# Patient Record
Sex: Male | Born: 1950 | Race: Black or African American | Hispanic: No | Marital: Married | Smoking: Former smoker
Health system: Southern US, Community
[De-identification: ages and names within clinical notes are randomized; demographics above are authoritative.]

## PROBLEM LIST (undated history)

## (undated) DIAGNOSIS — E785 Hyperlipidemia, unspecified: Secondary | ICD-10-CM

## (undated) DIAGNOSIS — C801 Malignant (primary) neoplasm, unspecified: Secondary | ICD-10-CM

## (undated) DIAGNOSIS — M069 Rheumatoid arthritis, unspecified: Secondary | ICD-10-CM

## (undated) DIAGNOSIS — I10 Essential (primary) hypertension: Secondary | ICD-10-CM

## (undated) HISTORY — PX: COLONOSCOPY WITH PROPOFOL: SHX5780

## (undated) HISTORY — PX: MANDIBLE SURGERY: SHX707

---

## 2007-09-17 ENCOUNTER — Ambulatory Visit: Payer: Self-pay | Admitting: General Surgery

## 2008-12-21 ENCOUNTER — Other Ambulatory Visit: Payer: Self-pay

## 2008-12-22 ENCOUNTER — Other Ambulatory Visit: Payer: Self-pay | Admitting: Endocrinology

## 2009-01-17 ENCOUNTER — Ambulatory Visit: Payer: Self-pay | Admitting: Unknown Physician Specialty

## 2009-10-30 ENCOUNTER — Ambulatory Visit: Payer: Self-pay | Admitting: Neurology

## 2014-09-29 ENCOUNTER — Ambulatory Visit: Payer: Self-pay

## 2019-08-25 ENCOUNTER — Ambulatory Visit: Payer: Medicare Other | Attending: Internal Medicine

## 2019-08-25 DIAGNOSIS — Z23 Encounter for immunization: Secondary | ICD-10-CM | POA: Insufficient documentation

## 2019-08-25 NOTE — Progress Notes (Signed)
   Covid-19 Vaccination Clinic  Name:  Ralph Doyle    MRN: [id number] DOB: 1951/05/29  08/25/2019  [name] was observed post Covid-19 immunization for 15 minutes without incidence. He was provided with Vaccine Information Sheet and instruction to access the V-Safe system.   [name] was instructed to call 911 with any severe reactions post vaccine: Marland Kitchen Difficulty breathing  . Swelling of your face and throat  . A fast heartbeat  . A bad rash all over your body  . Dizziness and weakness    Immunizations Administered    Name Date Dose VIS Date Route   Pfizer COVID-19 Vaccine 08/25/2019 10:34 AM 0.3 mL 06/10/2019 Intramuscular   Manufacturer: [place]   Lot: [id number]   [place]: [id number]

## 2019-09-20 ENCOUNTER — Ambulatory Visit: Payer: Medicare Other | Attending: Internal Medicine

## 2019-09-20 DIAGNOSIS — Z23 Encounter for immunization: Secondary | ICD-10-CM

## 2019-09-20 NOTE — Progress Notes (Signed)
   Covid-19 Vaccination Clinic  Name:  Ralph Doyle    MRN: [id number] DOB: [birth year]  09/20/2019  [name] was observed post Covid-19 immunization for 15 minutes without incident. He was provided with Vaccine Information Sheet and instruction to access the V-Safe system.   [name] was instructed to call 911 with any severe reactions post vaccine: Marland Kitchen Difficulty breathing  . Swelling of face and throat  . A fast heartbeat  . A bad rash all over body  . Dizziness and weakness   Immunizations Administered    Name Date Dose VIS Date Route   Pfizer COVID-19 Vaccine 09/20/2019 10:47 AM 0.3 mL 06/10/2019 Intramuscular   Manufacturer: [place]   Lot: [id number]   [place]: [id number]

## 2020-04-18 ENCOUNTER — Inpatient Hospital Stay: Admission: RE | Admit: 2020-04-18 | Payer: Medicare Other | Source: Ambulatory Visit

## 2020-04-20 ENCOUNTER — Other Ambulatory Visit: Payer: Medicare Other

## 2020-05-08 ENCOUNTER — Encounter
Admission: RE | Admit: 2020-05-08 | Discharge: 2020-05-08 | Disposition: A | Discharge status: Home / Self Care | Payer: Medicare Other | Source: Ambulatory Visit | Attending: Otolaryngology | Admitting: Otolaryngology

## 2020-05-08 ENCOUNTER — Other Ambulatory Visit: Payer: Self-pay

## 2020-05-08 DIAGNOSIS — Z01818 Encounter for other preprocedural examination: Secondary | ICD-10-CM | POA: Insufficient documentation

## 2020-05-08 HISTORY — DX: Malignant (primary) neoplasm, unspecified: C80.1

## 2020-05-08 NOTE — Patient Instructions (Signed)
Your procedure is scheduled on: Wednesday 05/16/20.  Report to THE FIRST FLOOR REGISTRATION DESK IN THE MEDICAL MALL ON THE MORNING OF SURGERY FIRST, THEN YOU WILL CHECK IN AT THE SURGERY INFORMATION DESK LOCATED OUTSIDE THE SAME DAY SURGERY DEPARTMENT LOCATED ON 2ND FLOOR MEDICAL MALL ENTRANCE.  To find out your arrival time please call [phone] between 1PM - 3PM on Tuesday 05/15/20.   Remember: Instructions that are not followed completely may result in serious medical risk, up to and including death, or upon the discretion of your surgeon and anesthesiologist your surgery may need to be rescheduled.     __X__ 1. Do not eat food after midnight the night before your procedure.                 No gum chewing or hard candies. You may drink clear liquids up to 2 hours                 before you are scheduled to arrive for your surgery- DO NOT drink clear                 liquids within 2 hours of the start of your surgery.                 Clear Liquids include:  water, apple juice without pulp, clear carbohydrate                 drink such as Clearfast or Gatorade, Black Coffee or Tea (Do not add                 milk or creamer to coffee or tea).  __X__2.  On the morning of surgery brush your teeth with toothpaste and water, you may rinse your mouth with mouthwash if you wish.  Do not swallow any toothpaste or mouthwash.    __X__ 3.  No Alcohol for 24 hours before or after surgery.  __X__ 4.  Do Not Smoke or use e-cigarettes For 24 Hours Prior to Your Surgery.                 Do not use any chewable tobacco products for at least 6 hours prior to                 surgery.  __X__5.  Notify your doctor if there is any change in your medical condition      (cold, fever, infections).      Do NOT wear jewelry, make-up, hairpins, clips or nail polish. Do NOT wear lotions, powders, or perfumes.  Do NOT shave 48 hours prior to surgery. Men may shave face and neck. Do NOT bring valuables to  the hospital.     [facility] is not responsible for any belongings or valuables.   Contacts, dentures/partials or body piercings may not be worn into surgery. Bring a case for your contacts, glasses or hearing aids, a denture cup will be supplied. Leave your suitcase in the car. After surgery it may be brought to your room.   For patients admitted to the hospital, discharge time is determined by your treatment team.    Patients discharged the day of surgery will not be allowed to drive home.     __X__ Take these medicines the morning of surgery with A SIP OF WATER:     1. amLODipine (NORVASC)  2. atorvastatin (LIPITOR)     __X__ Use CHG Soap as directed  __X__ Stop Anti-inflammatories 7 days before  surgery such as Advil, Ibuprofen, Motrin, BC or Goodies Powder, Naprosyn, Naproxen, Aleve, Aspirin, Meloxicam. May take Tylenol if needed for pain or discomfort.   __X__Do not start taking any new herbal supplements or vitamins prior to your procedure.     Wear comfortable clothing (specific to your surgery type) to the hospital.  Plan for stool softeners for home use; pain medications have a tendency to cause constipation. You can also help prevent constipation by eating foods high in fiber such as fruits and vegetables and drinking plenty of fluids as your diet allows.  After surgery, you can prevent lung complications by doing breathing exercises.Take deep breaths and cough every 1-2 hours. Your doctor may order a device called an Incentive Spirometer to help you take deep breaths.  Please call the [place] Department at [phone] if you have any questions about these instructions.

## 2020-05-14 ENCOUNTER — Other Ambulatory Visit
Admission: RE | Admit: 2020-05-14 | Discharge: 2020-05-14 | Disposition: A | Discharge status: Home / Self Care | Payer: Medicare Other | Source: Ambulatory Visit | Attending: Otolaryngology | Admitting: Otolaryngology

## 2020-05-14 ENCOUNTER — Other Ambulatory Visit: Payer: Self-pay

## 2020-05-14 DIAGNOSIS — Z01812 Encounter for preprocedural laboratory examination: Secondary | ICD-10-CM | POA: Insufficient documentation

## 2020-05-14 DIAGNOSIS — Z20822 Contact with and (suspected) exposure to covid-19: Secondary | ICD-10-CM | POA: Insufficient documentation

## 2020-05-14 LAB — BASIC METABOLIC PANEL
Anion gap: 9 (ref 5–15)
BUN: 17 mg/dL (ref 8–23)
CO2: 30 mmol/L (ref 22–32)
Calcium: 10 mg/dL (ref 8.9–10.3)
Chloride: 101 mmol/L (ref 98–111)
Creatinine, Ser: 1.3 mg/dL — ABNORMAL HIGH (ref 0.61–1.24)
GFR, Estimated: >60 mL/min (ref >60)
Glucose, Bld: 97 mg/dL (ref 70–99)
Potassium: 4.8 mmol/L (ref 3.5–5.1)
Sodium: 140 mmol/L (ref 135–145)

## 2020-05-14 LAB — CBC
HCT: 46.8 % (ref 39.0–52.0)
Hemoglobin: 15.5 g/dL (ref 13.0–17.0)
MCH: 30.8 pg (ref 26.0–34.0)
MCHC: 33.1 g/dL (ref 30.0–36.0)
MCV: 92.9 fL (ref 80.0–100.0)
Platelets: 297 10*3/uL (ref 150–400)
RBC: 5.04 MIL/uL (ref 4.22–5.81)
RDW: 12.6 % (ref 11.5–15.5)
WBC: 9.7 10*3/uL (ref 4.0–10.5)
nRBC: 0 % (ref 0.0–0.2)

## 2020-05-15 LAB — SARS CORONAVIRUS 2 (TAT 6-24 HRS): SARS Coronavirus 2: NEGATIVE

## 2020-05-15 MED ORDER — LACTATED RINGERS IV SOLN: INTRAVENOUS | Status: DC

## 2020-05-15 MED ORDER — CHLORHEXIDINE GLUCONATE 0.12 % MT SOLN
15.0000 mL | Freq: Once | OROMUCOSAL | Status: AC
  Administered 2020-05-16: 15 mL via OROMUCOSAL

## 2020-05-15 MED ORDER — ORAL CARE MOUTH RINSE: 15.0000 mL | Freq: Once | OROMUCOSAL | Status: AC

## 2020-05-15 MED ORDER — FAMOTIDINE 20 MG PO TABS
20.0000 mg | ORAL_TABLET | Freq: Once | ORAL | Status: DC
  Filled 2020-05-15: qty 1

## 2020-05-16 ENCOUNTER — Ambulatory Visit: Payer: Medicare Other | Admitting: Urgent Care

## 2020-05-16 ENCOUNTER — Encounter: Payer: Self-pay | Admitting: Otolaryngology

## 2020-05-16 ENCOUNTER — Encounter
Admission: RE | Disposition: A | Discharge status: Home / Self Care | Payer: Self-pay | Source: Ambulatory Visit | Attending: Otolaryngology

## 2020-05-16 ENCOUNTER — Other Ambulatory Visit: Payer: Self-pay

## 2020-05-16 ENCOUNTER — Observation Stay
Admission: RE | Admit: 2020-05-16 | Discharge: 2020-05-17 | Disposition: A | Discharge status: Home / Self Care | Payer: Medicare Other | Source: Ambulatory Visit | Attending: Otolaryngology | Admitting: Otolaryngology

## 2020-05-16 DIAGNOSIS — C73 Malignant neoplasm of thyroid gland: Principal | ICD-10-CM | POA: Insufficient documentation

## 2020-05-16 HISTORY — DX: Hyperlipidemia, unspecified: E78.5

## 2020-05-16 HISTORY — DX: Essential (primary) hypertension: I10

## 2020-05-16 HISTORY — PX: THYROIDECTOMY: SHX17

## 2020-05-16 HISTORY — DX: Rheumatoid arthritis, unspecified: M06.9

## 2020-05-16 LAB — URINE DRUG SCREEN, QUALITATIVE (ARMC ONLY)
Amphetamines, Ur Screen: NOT DETECTED
Barbiturates, Ur Screen: NOT DETECTED
Benzodiazepine, Ur Scrn: NOT DETECTED
Cannabinoid 50 Ng, Ur ~~LOC~~: POSITIVE — AB
Cocaine Metabolite,Ur ~~LOC~~: NOT DETECTED
MDMA (Ecstasy)Ur Screen: NOT DETECTED
Methadone Scn, Ur: NOT DETECTED
Opiate, Ur Screen: NOT DETECTED
Phencyclidine (PCP) Ur S: NOT DETECTED
Tricyclic, Ur Screen: NOT DETECTED

## 2020-05-16 LAB — CALCIUM: Calcium: 9.3 mg/dL (ref 8.9–10.3)

## 2020-05-16 SURGERY — THYROIDECTOMY
Anesthesia: General | Site: Throat | Laterality: Bilateral

## 2020-05-16 MED ORDER — SODIUM CHLORIDE (PF) 0.9 % IJ SOLN
INTRAMUSCULAR | Status: AC
  Filled 2020-05-16: qty 20

## 2020-05-16 MED ORDER — ATORVASTATIN CALCIUM 10 MG PO TABS
10.0000 mg | ORAL_TABLET | Freq: Every day | ORAL | Status: DC
  Filled 2020-05-16: qty 1

## 2020-05-16 MED ORDER — PHENYLEPHRINE HCL (PRESSORS) 10 MG/ML IV SOLN
INTRAVENOUS | Status: AC
  Filled 2020-05-16: qty 1

## 2020-05-16 MED ORDER — FENTANYL CITRATE (PF) 100 MCG/2ML IJ SOLN
INTRAMUSCULAR | Status: AC
  Filled 2020-05-16: qty 2

## 2020-05-16 MED ORDER — LIDOCAINE HCL (PF) 2 % IJ SOLN
INTRAMUSCULAR | Status: AC
  Filled 2020-05-16: qty 5

## 2020-05-16 MED ORDER — AMLODIPINE BESYLATE 5 MG PO TABS
5.0000 mg | ORAL_TABLET | Freq: Every day | ORAL | Status: DC
  Administered 2020-05-17: 5 mg via ORAL
  Filled 2020-05-16 (×2): qty 1

## 2020-05-16 MED ORDER — ONDANSETRON HCL 4 MG/2ML IJ SOLN
4.0000 mg | INTRAMUSCULAR | Status: DC | PRN
  Administered 2020-05-16: 4 mg via INTRAVENOUS
  Filled 2020-05-16: qty 2

## 2020-05-16 MED ORDER — ACETAMINOPHEN 650 MG RE SUPP: 650.0000 mg | RECTAL | Status: DC | PRN

## 2020-05-16 MED ORDER — ONDANSETRON HCL 4 MG PO TABS: 4.0000 mg | ORAL_TABLET | ORAL | Status: DC | PRN

## 2020-05-16 MED ORDER — BACITRACIN ZINC 500 UNIT/GM EX OINT
TOPICAL_OINTMENT | CUTANEOUS | Status: AC
  Filled 2020-05-16: qty 28.35

## 2020-05-16 MED ORDER — PROPOFOL 10 MG/ML IV BOLUS
INTRAVENOUS | Status: DC | PRN
  Administered 2020-05-16: 50 mg via INTRAVENOUS
  Administered 2020-05-16: 150 mg via INTRAVENOUS

## 2020-05-16 MED ORDER — KCL IN DEXTROSE-NACL 20-5-0.45 MEQ/L-%-% IV SOLN: INTRAVENOUS | Status: DC

## 2020-05-16 MED ORDER — ONDANSETRON HCL 4 MG/2ML IJ SOLN
INTRAMUSCULAR | Status: DC | PRN
  Administered 2020-05-16: 4 mg via INTRAVENOUS

## 2020-05-16 MED ORDER — LIDOCAINE-EPINEPHRINE (PF) 1 %-1:200000 IJ SOLN
INTRAMUSCULAR | Status: DC | PRN
  Administered 2020-05-16: 3 mL

## 2020-05-16 MED ORDER — OXYCODONE HCL 5 MG/5ML PO SOLN
ORAL | Status: AC
  Filled 2020-05-16: qty 5

## 2020-05-16 MED ORDER — BACITRACIN 500 UNIT/GM EX OINT
TOPICAL_OINTMENT | CUTANEOUS | Status: DC | PRN
  Administered 2020-05-16: 1 via TOPICAL

## 2020-05-16 MED ORDER — LIDOCAINE-EPINEPHRINE (PF) 1 %-1:200000 IJ SOLN
INTRAMUSCULAR | Status: AC
  Filled 2020-05-16: qty 30

## 2020-05-16 MED ORDER — DEXAMETHASONE SODIUM PHOSPHATE 10 MG/ML IJ SOLN
INTRAMUSCULAR | Status: DC | PRN
  Administered 2020-05-16: 10 mg via INTRAVENOUS

## 2020-05-16 MED ORDER — MEPERIDINE HCL 50 MG/ML IJ SOLN: 6.2500 mg | INTRAMUSCULAR | Status: DC | PRN

## 2020-05-16 MED ORDER — REMIFENTANIL HCL 1 MG IV SOLR
INTRAVENOUS | Status: AC
  Filled 2020-05-16: qty 1000

## 2020-05-16 MED ORDER — MORPHINE SULFATE (PF) 2 MG/ML IV SOLN: 2.0000 mg | INTRAVENOUS | Status: DC | PRN

## 2020-05-16 MED ORDER — REMIFENTANIL HCL 1 MG IV SOLR
INTRAVENOUS | Status: DC | PRN
Start: 2020-05-16 — End: 2020-05-16
  Administered 2020-05-16: .02 ug/kg/min via INTRAVENOUS

## 2020-05-16 MED ORDER — FAMOTIDINE 20 MG PO TABS
20.0000 mg | ORAL_TABLET | Freq: Every day | ORAL | Status: DC
  Administered 2020-05-16 – 2020-05-17 (×2): 20 mg via ORAL
  Filled 2020-05-16: qty 1

## 2020-05-16 MED ORDER — ACETAMINOPHEN 160 MG/5ML PO SOLN
650.0000 mg | ORAL | Status: DC | PRN
  Filled 2020-05-16: qty 20.3

## 2020-05-16 MED ORDER — OXYCODONE HCL 5 MG/5ML PO SOLN
5.0000 mg | Freq: Once | ORAL | Status: AC | PRN
  Administered 2020-05-16: 5 mg via ORAL

## 2020-05-16 MED ORDER — ACETAMINOPHEN 10 MG/ML IV SOLN
INTRAVENOUS | Status: AC
  Filled 2020-05-16: qty 100

## 2020-05-16 MED ORDER — FENTANYL CITRATE (PF) 100 MCG/2ML IJ SOLN
25.0000 ug | INTRAMUSCULAR | Status: DC | PRN
  Administered 2020-05-16 (×4): 50 ug via INTRAVENOUS

## 2020-05-16 MED ORDER — MENTHOL 3 MG MT LOZG
1.0000 | LOZENGE | OROMUCOSAL | Status: DC | PRN
  Administered 2020-05-16: 3 mg via ORAL
  Filled 2020-05-16: qty 9

## 2020-05-16 MED ORDER — DEXAMETHASONE SODIUM PHOSPHATE 10 MG/ML IJ SOLN
INTRAMUSCULAR | Status: AC
  Filled 2020-05-16: qty 1

## 2020-05-16 MED ORDER — CALCIUM CARBONATE 1250 (500 CA) MG PO TABS
1.0000 | ORAL_TABLET | Freq: Two times a day (BID) | ORAL | Status: DC
  Administered 2020-05-17: 500 mg via ORAL
  Filled 2020-05-16 (×3): qty 1

## 2020-05-16 MED ORDER — ONDANSETRON HCL 4 MG/2ML IJ SOLN
INTRAMUSCULAR | Status: AC
  Filled 2020-05-16: qty 2

## 2020-05-16 MED ORDER — SODIUM CHLORIDE 0.9 % IV SOLN
INTRAVENOUS | Status: DC | PRN
  Administered 2020-05-16: 10 ug/min via INTRAVENOUS

## 2020-05-16 MED ORDER — HYDROCODONE-ACETAMINOPHEN 5-325 MG PO TABS
1.0000 | ORAL_TABLET | ORAL | Status: DC | PRN
  Administered 2020-05-16 – 2020-05-17 (×3): 1 via ORAL
  Filled 2020-05-16 (×3): qty 1

## 2020-05-16 MED ORDER — BACITRACIN ZINC 500 UNIT/GM EX OINT
1.0000 "application " | TOPICAL_OINTMENT | Freq: Three times a day (TID) | CUTANEOUS | Status: DC
  Administered 2020-05-16 – 2020-05-17 (×3): 1 via TOPICAL
  Filled 2020-05-16 (×3): qty 0.9

## 2020-05-16 MED ORDER — LIDOCAINE HCL (CARDIAC) PF 100 MG/5ML IV SOSY
PREFILLED_SYRINGE | INTRAVENOUS | Status: DC | PRN
  Administered 2020-05-16: 80 mg via INTRAVENOUS

## 2020-05-16 MED ORDER — SUCCINYLCHOLINE CHLORIDE 20 MG/ML IJ SOLN
INTRAMUSCULAR | Status: DC | PRN
  Administered 2020-05-16: 100 mg via INTRAVENOUS

## 2020-05-16 MED ORDER — CHLORHEXIDINE GLUCONATE 0.12 % MT SOLN
OROMUCOSAL | Status: AC
  Filled 2020-05-16: qty 15

## 2020-05-16 MED ORDER — PROPOFOL 10 MG/ML IV BOLUS
INTRAVENOUS | Status: AC
  Filled 2020-05-16: qty 20

## 2020-05-16 MED ORDER — DOCUSATE SODIUM 100 MG PO CAPS
100.0000 mg | ORAL_CAPSULE | Freq: Two times a day (BID) | ORAL | Status: DC
  Administered 2020-05-16 – 2020-05-17 (×2): 100 mg via ORAL
  Filled 2020-05-16 (×2): qty 1

## 2020-05-16 MED ORDER — PROMETHAZINE HCL 25 MG/ML IJ SOLN: 6.2500 mg | INTRAMUSCULAR | Status: DC | PRN

## 2020-05-16 MED ORDER — FENTANYL CITRATE (PF) 100 MCG/2ML IJ SOLN
INTRAMUSCULAR | Status: DC | PRN
  Administered 2020-05-16 (×3): 50 ug via INTRAVENOUS

## 2020-05-16 MED ORDER — ACETAMINOPHEN 10 MG/ML IV SOLN
INTRAVENOUS | Status: DC | PRN
  Administered 2020-05-16: 1000 mg via INTRAVENOUS

## 2020-05-16 MED ORDER — MIDAZOLAM HCL 2 MG/2ML IJ SOLN
INTRAMUSCULAR | Status: DC | PRN
  Administered 2020-05-16 (×2): 1 mg via INTRAVENOUS

## 2020-05-16 MED ORDER — OXYCODONE HCL 5 MG PO TABS: 5.0000 mg | ORAL_TABLET | Freq: Once | ORAL | Status: AC | PRN

## 2020-05-16 MED ORDER — DEXTROSE-NACL 5-0.45 % IV SOLN: INTRAVENOUS | Status: DC

## 2020-05-16 MED ORDER — MIDAZOLAM HCL 2 MG/2ML IJ SOLN
INTRAMUSCULAR | Status: AC
  Filled 2020-05-16: qty 2

## 2020-05-16 SURGICAL SUPPLY — 38 items
BLADE SURG 15 STRL LF DISP TIS (BLADE) ×1 IMPLANT
BLADE SURG 15 STRL SS (BLADE) ×2
BULB RESERV EVAC DRAIN JP 100C (MISCELLANEOUS) ×6 IMPLANT
CANISTER SUCT 1200ML W/VALVE (MISCELLANEOUS) ×3 IMPLANT
CORD BIP STRL DISP 12FT (MISCELLANEOUS) ×3 IMPLANT
COVER WAND RF STERILE (DRAPES) ×3 IMPLANT
DRAIN TLS ROUND 10FR (DRAIN) ×6 IMPLANT
DRAPE MAG INST 16X20 L/F (DRAPES) ×3 IMPLANT
DRSG TEGADERM 2-3/8X2-3/4 SM (GAUZE/BANDAGES/DRESSINGS) ×3 IMPLANT
ELECT LARYNGEAL 6/7 (MISCELLANEOUS) ×3
ELECT LARYNGEAL 8/9 (MISCELLANEOUS) ×3
ELECT REM PT RETURN 9FT ADLT (ELECTROSURGICAL) ×3
ELECTRODE LARYNGEAL 6/7 (MISCELLANEOUS) ×1 IMPLANT
ELECTRODE LARYNGEAL 8/9 (MISCELLANEOUS) ×1 IMPLANT
ELECTRODE REM PT RTRN 9FT ADLT (ELECTROSURGICAL) ×1 IMPLANT
FORCEPS JEWEL BIP 4-3/4 STR (INSTRUMENTS) ×3 IMPLANT
GAUZE 4X4 16PLY RFD (DISPOSABLE) ×3 IMPLANT
GLOVE BIO SURGEON STRL SZ7.5 (GLOVE) ×15 IMPLANT
GOWN STRL REUS W/ TWL LRG LVL3 (GOWN DISPOSABLE) ×4 IMPLANT
GOWN STRL REUS W/TWL LRG LVL3 (GOWN DISPOSABLE) ×8
HEMOSTAT SURGICEL 2X3 (HEMOSTASIS) ×3 IMPLANT
HOOK STAY BLUNT/RETRACTOR 5M (MISCELLANEOUS) ×3 IMPLANT
KIT TURNOVER KIT A (KITS) ×3 IMPLANT
LABEL OR SOLS (LABEL) ×3 IMPLANT
MANIFOLD NEPTUNE II (INSTRUMENTS) ×3 IMPLANT
NS IRRIG 500ML POUR BTL (IV SOLUTION) ×3 IMPLANT
PACK HEAD/NECK (MISCELLANEOUS) ×3 IMPLANT
PROBE NEUROSIGN BIPOL (MISCELLANEOUS) ×1 IMPLANT
PROBE NEUROSIGN BIPOLAR (MISCELLANEOUS) ×2
SHEARS HARMONIC 9CM CVD (BLADE) ×3 IMPLANT
SPONGE KITTNER 5P (MISCELLANEOUS) ×18 IMPLANT
SUT PROLENE 3 0 PS 2 (SUTURE) ×3 IMPLANT
SUT SILK 2 0 (SUTURE) ×2
SUT SILK 2 0 SH (SUTURE) ×3 IMPLANT
SUT SILK 2-0 18XBRD TIE 12 (SUTURE) ×1 IMPLANT
SUT VIC AB 4-0 RB1 18 (SUTURE) ×3 IMPLANT
SYSTEM CHEST DRAIN TLS 7FR (DRAIN) ×3 IMPLANT
TOWEL OR 17X26 4PK STRL BLUE (TOWEL DISPOSABLE) ×3 IMPLANT

## 2020-05-16 NOTE — Anesthesia Procedure Notes (Addendum)
Procedure Name: Intubation Date/Time: 05/16/2020 8:42 AM Performed by: Tollie Eth, CRNA Pre-anesthesia Checklist: Patient identified, Patient being monitored, Timeout performed, Emergency Drugs available and Suction available Patient Re-evaluated:Patient Re-evaluated prior to induction Oxygen Delivery Method: Circle system utilized Preoxygenation: Pre-oxygenation with 100% oxygen Induction Type: IV induction Ventilation: Mask ventilation without difficulty Laryngoscope Size: McGraph and 4 Grade View: Grade I Tube type: Oral Tube size: 7.0 mm Number of attempts: 1 Airway Equipment and Method: Stylet and Video-laryngoscopy Placement Confirmation: ETT inserted through vocal cords under direct vision,  positive ETCO2 and breath sounds checked- equal and bilateral Secured at: 21 cm Tube secured with: Tape Dental Injury: Teeth and Oropharynx as per pre-operative assessment  Comments: neurosign tube electrode in place. Surgeon present to verify electrode position between cords.

## 2020-05-16 NOTE — Anesthesia Postprocedure Evaluation (Signed)
Anesthesia Post Note  Patient: Ralph Doyle  Procedure(s) Performed: TOTAL THYROIDECTOMY (Bilateral Throat)  Patient location during evaluation: PACU Anesthesia Type: General Level of consciousness: awake and alert and oriented Pain management: pain level controlled Vital Signs Assessment: post-procedure vital signs reviewed and stable Respiratory status: spontaneous breathing, nonlabored ventilation and respiratory function stable Cardiovascular status: blood pressure returned to baseline and stable Postop Assessment: no signs of nausea or vomiting Anesthetic complications: no   No complications documented.   Last Vitals:  Vitals:   05/16/20 1336 05/16/20 1445  BP: (!) 153/87 (!) 156/93  Pulse: 69 68  Resp: 11 12  Temp:  37.6 C  SpO2: 97% 100%    Last Pain:  Vitals:   05/16/20 1445  TempSrc: Temporal  PainSc:                  Alyan Hartline

## 2020-05-16 NOTE — Op Note (Signed)
05/16/2020  11:57 AM    Ralph Doyle  [id number]   Pre-Op Diagnosis:  Papillary thyroid cancer, left lobe  Post-op Diagnosis: Same  Procedure:   Total thyroidectomy  Surgeon:  Riley Nearing   First Assistant: Margaretha Sheffield  Anesthesia:  General endotracheal  EBL:  Less than 50 cc  Complications:  None  Findings: 1.5 cm nodule left superior thyroid pole. No other masses, no adenopathy.  Procedure: After the patient was identified in holding and the procedure was reviewed.  The patient was taken to the operating room and with the patient in a comfortable supine position,  general endotracheal anesthesia was induced without difficulty.  A nerve monitor on the endotracheal tube was visualized to be between the cords at the time of intubation. A proper time-out was performed, confirming the operative site and procedure.  The patient was placed on a shoulder roll and position. Skin was injected with 1% lidocaine with epinephrine 1-200,000. The patient was then prepped and draped in the usual sterile fashion. A 15 blade was used to incise the skin carrying the incision down through the subcutaneous tissues. The platysma muscle was divided with the Bovie. The strap muscles were identified in the midline and divided in the midline, retracting them laterally. Small anterior jugular veins were either clamped and suture divided or divided with the Harmonic scalpel. The strap muscles were retracted laterally off of the capsule of the left lobe of the thyroid gland. This was then finger dissected to loosen loose attachments to the gland. Dissection proceeded superiorly, dissecting the superior pole of the gland. The superior pole vessels were divided with the Harmonic scalpel. Tissue consistent with a small parathyroid gland was visualized and preserved during this dissection. The superior pole was retracted inferiorly and dissection proceeded around the lateral aspect of the gland, dividing  vascular attachments with the Harmonic scalpel. The inferior pole was dissected, identifying a small structure consistent with the parathyroid, and the inferior pole vessels were divided with the Harmonic scalpel. Care was taken to divide all of these vessels right at the capsule of the gland. The gland was then retracted medially and delivered from the wound. Dissection along the trachea was performed to locate the recurrent laryngeal nerve. Intermittent stimulation was noted, though the nerve appeared to be deep in the fat tissue lateral to the trachea, visualized intermittently but not clearly dissected out. As dissection continued along the capsule of the gland, it was freed up enough to Berry's ligament to carefully dissect away from the trachea safely despite difficulty clearly identifying the nerve itself.. The gland was then dissected off of the trachea.   Next the right lobe of the thyroid gland was dissected in the same fashion as described above. Once again the superior and inferior pole vessels were divided right at the capsule of the gland and structures consistent with parathyroid tissue were identified both superiorly and inferiorly. Retracting the gland medially the recurrent laryngeal nerve was dissected and at one point, similar to the left side, a structure following the expected path of the nerve was identified with note of slight stimulation as well as palpable movement at the cricoarytenoid joint for confirmation. The gland was carefully dissected away from this location, staying on the capsule, and dissecting it away from the trachea, dividing Berry's ligament. The whole gland was delivered and sent as a specimen with the left superior lobe marked with a stitch.  The wound was then irrigated and inspected for bleeding. #10 TLS drains  were placed on either side of the trachea with the drains coming out through the skin just below the wound. The drains were secured with 4-0 Vicryl suture.  Surgicel was placed on either side of the trachea to control minor oozing within the wound. The strap muscles were then reapproximated with 4-0 Vicryl suture. The platysma and subcutaneous tissues were also closed with 4-0 Vicryl suture. The skin was closed with a 3-0 running subcuticular Prolene suture. The patient was then returned to the anesthesiologist for awakening. The patient was awakened and taken to recovery room in good condition postoperatively.  The patient was then returned to the anesthesiologist in good condition for awakening. The patient was awakened and taken to the recovery room in good condition.   Disposition:   PACU then transferred to floor  Plan: The patient is to be admitted for observation and monitoring of serum calcium, with potential discharge tomorrow if serum calcium is stable overnight.  Riley Nearing 05/16/2020 11:57 AM

## 2020-05-16 NOTE — Transfer of Care (Signed)
Immediate Anesthesia Transfer of Care Note  Patient: Ralph Doyle  Procedure(s) Performed: TOTAL THYROIDECTOMY (Bilateral Throat)  Patient Location: PACU  Anesthesia Type:General  Level of Consciousness: drowsy and patient cooperative  Airway & Oxygen Therapy: Patient Spontanous Breathing and Patient connected to face mask oxygen  Post-op Assessment: Report given to RN and Post -op Vital signs reviewed and stable  Post vital signs: Reviewed and stable  Last Vitals:  Vitals Value Taken Time  BP 147/98 05/16/20 1206  Temp    Pulse 85 05/16/20 1206  Resp 14 05/16/20 1206  SpO2 98 % 05/16/20 1206  Vitals shown include unvalidated device data.  Last Pain:  Vitals:   05/16/20 0734  TempSrc: Temporal  PainSc: 7       Patients Stated Pain Goal: 2 (53/29/92 4268)  Complications: No complications documented.

## 2020-05-16 NOTE — Anesthesia Preprocedure Evaluation (Signed)
Anesthesia Evaluation  Patient identified by MRN, date of birth, ID band Patient awake    Reviewed: Allergy & Precautions, NPO status , Patient's Chart, lab work & pertinent test results  History of Anesthesia Complications Negative for: history of anesthetic complications  Airway Mallampati: II  TM Distance: >3 FB Neck ROM: Full    Dental no notable dental hx.    Pulmonary neg pulmonary ROS, neg sleep apnea, neg COPD,    breath sounds clear to auscultation- rhonchi (-) wheezing      Cardiovascular Exercise Tolerance: Good hypertension, Pt. on medications (-) CAD, (-) Past MI, (-) Cardiac Stents and (-) CABG  Rhythm:Regular Rate:Normal - Systolic murmurs and - Diastolic murmurs    Neuro/Psych neg Seizures negative neurological ROS  negative psych ROS   GI/Hepatic negative GI ROS, Neg liver ROS,   Endo/Other  negative endocrine ROSneg diabetes  Renal/GU negative Renal ROS     Musculoskeletal negative musculoskeletal ROS (+)   Abdominal (+) - obese,   Peds  Hematology negative hematology ROS (+)   Anesthesia Other Findings Past Medical History: No date: Cancer ([place])     Comment:  Thyroid   Reproductive/Obstetrics                             Anesthesia Physical Anesthesia Plan  ASA: II  Anesthesia Plan: General   Post-op Pain Management:    Induction: Intravenous  PONV Risk Score and Plan: 1 and Ondansetron and Dexamethasone  Airway Management Planned: Oral ETT  Additional Equipment:   Intra-op Plan:   Post-operative Plan: Extubation in OR  Informed Consent: I have reviewed the patients History and Physical, chart, labs and discussed the procedure including the risks, benefits and alternatives for the proposed anesthesia with the patient or authorized representative who has indicated his/her understanding and acceptance.     Dental advisory given  Plan Discussed with:  CRNA and Anesthesiologist  Anesthesia Plan Comments:         Anesthesia Quick Evaluation

## 2020-05-16 NOTE — Progress Notes (Signed)
Ralph, Doyle [id number] 07/11/1950 Ralph Nearing, MD   SUBJECTIVE: This 69 y.o. year old male is status post THYROIDECTOMY. Doing well, no hoarseness. Tolerating full liquids, just some sore throat. Minimal serous drain output.  Medications:  Current Facility-Administered Medications  Medication Dose Route Frequency Provider Last Rate Last Admin  . acetaminophen (TYLENOL) 160 MG/5ML solution 650 mg  650 mg Oral Q4H PRN Clyde Canterbury, MD       Or  . acetaminophen (TYLENOL) suppository 650 mg  650 mg Rectal Q4H PRN Clyde Canterbury, MD      . amLODipine (NORVASC) tablet 5 mg  5 mg Oral Daily Clyde Canterbury, MD      . atorvastatin (LIPITOR) tablet 10 mg  10 mg Oral Daily Clyde Canterbury, MD      . bacitracin ointment 1 application  1 application Topical G8J Clyde Canterbury, MD   1 application at 85/63/14 1741  . calcium carbonate (OS-CAL - dosed in mg of elemental calcium) tablet 500 mg of elemental calcium  1 tablet Oral BID WC Clyde Canterbury, MD      . dextrose 5 %-0.45 % sodium chloride infusion   Intravenous Continuous Clyde Canterbury, MD 100 mL/hr at 05/16/20 1700 Rate Verify at 05/16/20 1700  . docusate sodium (COLACE) capsule 100 mg  100 mg Oral BID Clyde Canterbury, MD      . famotidine (PEPCID) tablet 20 mg  20 mg Oral Once Karen Kitchens, NP      . famotidine (PEPCID) tablet 20 mg  20 mg Oral Daily Clyde Canterbury, MD   20 mg at 05/16/20 1740  . HYDROcodone-acetaminophen (NORCO/VICODIN) 5-325 MG per tablet 1-2 tablet  1-2 tablet Oral Q4H PRN Clyde Canterbury, MD   1 tablet at 05/16/20 1806  . morphine 2 MG/ML injection 2-4 mg  2-4 mg Intravenous Q2H PRN Clyde Canterbury, MD      . ondansetron [facility]) tablet 4 mg  4 mg Oral Q4H PRN Clyde Canterbury, MD       Or  . ondansetron [facility]) injection 4 mg  4 mg Intravenous Q4H PRN Clyde Canterbury, MD      .  Medications Prior to Admission  Medication Sig Dispense Refill  . acetaminophen (TYLENOL) 650 MG CR tablet Take 1,300 mg by mouth every 8 (eight) hours as  needed for pain.    Marland Kitchen amLODipine (NORVASC) 5 MG tablet Take 5 mg by mouth daily.    Marland Kitchen atorvastatin (LIPITOR) 10 MG tablet Take 10 mg by mouth daily.    Marland Kitchen augmented betamethasone dipropionate (DIPROLENE-AF) 0.05 % ointment Apply 1 application topically daily as needed for rash.    . Calcium Carbonate-Vitamin D (OYSTERCAL 500 + D PO) Take 1 tablet by mouth 2 (two) times daily.    . Liniments (ANALGESIC EX) Apply 1 application topically daily as needed (pain).    . meloxicam (MOBIC) 15 MG tablet Take 15 mg by mouth daily.     Marland Kitchen OVER THE COUNTER MEDICATION Apply 1 application topically daily as needed (pain). CBD cream    . Polyethyl Glycol-Propyl Glycol (SYSTANE ULTRA OP) Place 1 drop into both eyes daily as needed (dry eyes).      OBJECTIVE:  PHYSICAL EXAM  Vitals: Blood pressure (!) 162/94, pulse 79, temperature 99.1 F (37.3 C), temperature source Oral, resp. rate 18, height 5' 8.5" (1.74 m), weight 88.5 kg, SpO2 98 %.. General: Well-developed, Well-nourished in no acute distress Mood: Mood and affect well adjusted, pleasant and cooperative. Orientation: Grossly alert and oriented.  Vocal Quality: No hoarseness. Communicates verbally. head and Face: NCAT. No facial asymmetry. No visible skin lesions. No significant facial scars. No tenderness with sinus percussion. Facial strength normal and symmetric. Nose: External nose normal with midline dorsum and no lesions or deformity. Nasal Cavity reveals essentially midline septum with normal inferior turbinates. No significant mucosal congestion or erythema. Nasal secretions are minimal and clear. No polyps seen on anterior rhinoscopy. Oral Cavity/ Oropharynx: Lips are normal with no lesions. Teeth no frank dental caries. Gingiva healthy with no lesions or gingivitis. Oropharynx including tongue, buccal mucosa, floor of mouth, hard and soft palate, uvula and posterior pharynx free of exudates, erythema or lesions with normal symmetry and hydration.   Neck: Supple and symmetric with no palpable masses, tenderness or crepitance. The trachea is midline. Thyroid gland is soft, nontender and symmetric with no masses or enlargement. Parotid and submandibular glands are soft, nontender and symmetric, without masses. Lymphatic: Cervical lymph nodes are without palpable lymphadenopathy or tenderness. Respiratory: Normal respiratory effort without labored breathing. Cardiovascular: Carotid pulse shows regular rate and rhythm Neurologic: Cranial Nerves II through XII are grossly intact. Eyes: Gaze and Ocular Motility are grossly normal. PERRLA. No visible nystagmus.  MEDICAL DECISION MAKING: Data Review:  Results for orders placed or performed during the hospital encounter of 05/16/20 (from the past 48 hour(s))  Urine Drug Screen, Qualitative ([place] only)     Status: Abnormal   Collection Time: 05/16/20  6:09 AM  Result Value Ref Range   Tricyclic, Ur Screen NONE DETECTED NONE DETECTED   Amphetamines, Ur Screen NONE DETECTED NONE DETECTED   MDMA (Ecstasy)Ur Screen NONE DETECTED NONE DETECTED   Cocaine Metabolite,Ur [place] NONE DETECTED NONE DETECTED   Opiate, Ur Screen NONE DETECTED NONE DETECTED   Phencyclidine (PCP) Ur S NONE DETECTED NONE DETECTED   Cannabinoid 50 Ng, Ur [place] POSITIVE (A) NONE DETECTED   Barbiturates, Ur Screen NONE DETECTED NONE DETECTED   Benzodiazepine, Ur Scrn NONE DETECTED NONE DETECTED   Methadone Scn, Ur NONE DETECTED NONE DETECTED    Comment: (NOTE) Tricyclics + metabolites, urine    Cutoff 1000 ng/mL Amphetamines + metabolites, urine  Cutoff 1000 ng/mL MDMA (Ecstasy), urine              Cutoff 500 ng/mL Cocaine Metabolite, urine          Cutoff 300 ng/mL Opiate + metabolites, urine        Cutoff 300 ng/mL Phencyclidine (PCP), urine         Cutoff 25 ng/mL Cannabinoid, urine                 Cutoff 50 ng/mL Barbiturates + metabolites, urine  Cutoff 200 ng/mL Benzodiazepine, urine              Cutoff 200  ng/mL Methadone, urine                   Cutoff 300 ng/mL  The urine drug screen provides only a preliminary, unconfirmed analytical test result and should not be used for non-medical purposes. Clinical consideration and professional judgment should be applied to any positive drug screen result due to possible interfering substances. A more specific alternate chemical method must be used in order to obtain a confirmed analytical result. Gas chromatography / mass spectrometry (GC/MS) is the preferred confirm atory method. Performed at [facility], [address] [place], [place] [address]   Calcium     Status: None   Collection Time: 05/16/20  5:02 PM  Result Value Ref Range   Calcium 9.3 8.9 - 10.3 mg/dL    Comment: Performed at [facility], [address] [place], [place] [address]  . No results found..   ASSESSMENT: S/p total thyroidectomy doing well. PM serum calcium normal  PLAN: IV fluids to KVO. Recheck calcium in AM. Likely d/c home tomorrow.    Ralph Nearing, MD 05/16/2020 6:07 PMPatient ID: Ralph Doyle, male   DOB: 03/02/1951, 69 y.o.   MRN: [id number]

## 2020-05-16 NOTE — H&P (Signed)
History and physical reviewed and will be scanned in later. No change in medical status reported by the patient or family, appears stable for surgery. All questions regarding the procedure answered, and patient (or family if a child) expressed understanding of the procedure. ? ?Ralph Doyle [name] ?@TODAY@ ?

## 2020-05-17 ENCOUNTER — Encounter: Payer: Self-pay | Admitting: Otolaryngology

## 2020-05-17 DIAGNOSIS — C73 Malignant neoplasm of thyroid gland: Secondary | ICD-10-CM | POA: Diagnosis not present

## 2020-05-17 LAB — CALCIUM: Calcium: 9.4 mg/dL (ref 8.9–10.3)

## 2020-05-17 MED ORDER — DOCUSATE SODIUM 100 MG PO CAPS
100.0000 mg | ORAL_CAPSULE | Freq: Two times a day (BID) | ORAL | 0 refills | Status: DC
Start: 2020-05-17 — End: 2020-08-15

## 2020-05-17 MED ORDER — BACITRACIN ZINC 500 UNIT/GM EX OINT
1.0000 | TOPICAL_OINTMENT | Freq: Three times a day (TID) | CUTANEOUS | 0 refills | Status: AC
Start: 2020-05-17 — End: ?

## 2020-05-17 MED ORDER — HYDROCODONE-ACETAMINOPHEN 5-325 MG PO TABS
1.0000 | ORAL_TABLET | Freq: Four times a day (QID) | ORAL | 0 refills | Status: DC | PRN
Start: 2020-05-17 — End: 2020-08-15

## 2020-05-17 NOTE — Discharge Summary (Signed)
Patient underwent total thyroidectomy yesterday for papillary thyroid cancer and today is doing well, tolerating PO diet with good pain control. Serum calcium normal and stable. Will discharge home on Lortab for pain control. Drains removed so just routine wound care. F/u in 1 week for suture removal. No strenuous activity for 2 weeks. Will hold on thyroid replacement therapy until we get path back.

## 2020-05-17 NOTE — Progress Notes (Signed)
Verified d/c order from MD.  Patient had JP drains removed- moderate amount serosanguinous drainage- MD notified stated patient can monitor and change gauze pads at home as needed.  Notified patient.  AVS reviewed, discussed follow up appointments, medications, discharge instructions- patient verbalized understanding.  Pt stable, VSS. To follow up with primary/surgeon per AVS.

## 2020-05-17 NOTE — Progress Notes (Signed)
Called to room by patient, who stated that his drains were leaking around the insertion site; serosanguinous fluid noted dripping from both JP insertion sites; cleansed and new dressing applied; minimal drainage noted overnight. Barbaraann Faster, RN 7:03 AM 05/17/2020

## 2020-05-17 NOTE — Final Progress Note (Signed)
Physician Final Progress Note  Patient ID: Ralph Doyle MRN: [id number] DOB/AGE: August 27, 1950 69 y.o.  Admit date: 05/16/2020 Admitting provider: Clyde Canterbury, MD Discharge date: 05/17/2020   Admission Diagnoses: thyroid cancer left thyroid lobe  Discharge Diagnoses:  Active Problems:   Thyroid malignant neoplasm (HCC)    Consults: none  Significant Findings/ Diagnostic Studies: Path pending  Procedures: Total thyroidectomy  Discharge Condition: Good  Disposition: Discharge disposition: 01-Home or Self Care       Diet: Regular diet  Discharge Activity: No strenuous activity for 2 weeks  Discharge Instructions    Call MD for:  persistant nausea and vomiting   Complete by: As directed    Call MD for:  redness, tenderness, or signs of infection (pain, swelling, redness, odor or green/yellow discharge around incision site)   Complete by: As directed    Call MD for:  severe uncontrolled pain   Complete by: As directed    Call MD for:  temperature >100.4   Complete by: As directed    Diet - low sodium heart healthy   Complete by: As directed    Discharge instructions   Complete by: As directed    Bacitracin to wound twice daily for 5 days. Wound may be cleaned as needed with hydrogen peroxide. Patient may shower. Call for fever, excessive swelling at wound site, or uncontrolled pain. Take pain medications as prescribed. An ice pack may be applied to control minor swelling and as needed for comfort. Follow-up in 1 week for suture removal. May resume meloxicam Monday.   Discharge wound care:   Complete by: As directed    Bacitracin to wound bid. May clean wound as needed with hydrogen peroxide. Patient may shower. Call for fever, excessive swelling at wound site, or uncontrolled pain. Take pain medications as prescribed. An ice pack may be applied to control minor swelling and as needed for comfort. Follow-up in 1 week for suture removal.   Increase activity slowly    Complete by: As directed    No strenuous activity, heavy lifting for 2 weeks     Allergies as of 05/17/2020   No Known Allergies     Medication List    STOP taking these medications   acetaminophen 650 MG CR tablet Commonly known as: TYLENOL   meloxicam 15 MG tablet Commonly known as: MOBIC     TAKE these medications   amLODipine 5 MG tablet Commonly known as: NORVASC Take 5 mg by mouth daily.   ANALGESIC EX Apply 1 application topically daily as needed (pain).   atorvastatin 10 MG tablet Commonly known as: LIPITOR Take 10 mg by mouth daily.   augmented betamethasone dipropionate 0.05 % ointment Commonly known as: DIPROLENE-AF Apply 1 application topically daily as needed for rash.   bacitracin ointment Apply 1 application topically every 8 (eight) hours.   docusate sodium 100 MG capsule Commonly known as: COLACE Take 1 capsule (100 mg total) by mouth 2 (two) times daily.   HYDROcodone-acetaminophen 5-325 MG tablet Commonly known as: NORCO/VICODIN Take 1-2 tablets by mouth every 6 (six) hours as needed for moderate pain.   OVER THE COUNTER MEDICATION Apply 1 application topically daily as needed (pain). CBD cream   OYSTERCAL 500 + D PO Take 1 tablet by mouth 2 (two) times daily.   SYSTANE ULTRA OP Place 1 drop into both eyes daily as needed (dry eyes).            Discharge Care Instructions  (From admission, onward)  Start     Ordered   05/17/20 0000  Discharge wound care:       Comments: Bacitracin to wound bid. May clean wound as needed with hydrogen peroxide. Patient may shower. Call for fever, excessive swelling at wound site, or uncontrolled pain. Take pain medications as prescribed. An ice pack may be applied to control minor swelling and as needed for comfort. Follow-up in 1 week for suture removal.   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 Serum calcium normal and stable. Will discharge home on Lortab for pain control. Drains removed so just routine wound care. F/u in 1 week for suture removal. No strenuous activity for 2 weeks. Will hold on thyroid replacement therapy until we get path back.   Signed: Riley Nearing 05/17/2020, 8:29 AM

## 2020-05-21 LAB — SURGICAL PATHOLOGY

## 2020-06-13 NOTE — Progress Notes (Signed)
Office Visit Note  Patient: Ralph Doyle             Date of Birth: [birth year]           MRN: [id number]             PCP: Lenard Simmer, MD Referring: Lenard Simmer, MD Visit Date: 06/14/2020   Subjective:  Pain and Edema of the Right Hand, Pain and Edema of the Left Hand, Pain of the Neck, New Patient (Initial Visit), and Joint Pain (Bilateral hands, left>right; neck pain; bilateral shoulder pain)   History of Present Illness: Ralph Doyle is a 69 y.o. male here for evaluation of joint pain in multiple sites including both shoulders with positive rheumatoid factor. He had been feeling in his usual health until within about 2 months ago he has developed worsening pain and stiffness affecting his shoulders and left hand and wrist. Before this problem of worsening joint pain he had surgery for thyroid cancer identified with FNA of nodule, he did not require chemo or radiation therapy apparently. He has been previously evaluated with positive RF since years ago but states never went on any treatment due to no major joint problems at that time.  Labs with his PCP office showed increased CRP and positive RF and he started methotrexate and a prednisone taper. He has taken 2 doses of methotrexate with no problems so far. The prednisone greatly improved his symptoms, he finished taking it 2 days ago and is noticing worsening pain and swelling again today.  Labs reviewed 03/2020 RF 26.6 CRP 53 ANA negative CMP normal CBC normal   Activities of Daily Living:  Patient reports morning stiffness for 24 hours.   Patient Reports nocturnal pain.  Difficulty dressing/grooming: Reports Difficulty climbing stairs: Denies Difficulty getting out of chair: Denies Difficulty using hands for taps, buttons, cutlery, and/or writing: Reports  Review of Systems  Constitutional: Negative for fatigue.  HENT: Negative for mouth sores, mouth dryness and nose dryness.   Eyes: Positive for itching  and dryness. Negative for pain and visual disturbance.  Respiratory: Negative for cough, hemoptysis, shortness of breath and difficulty breathing.   Cardiovascular: Negative for chest pain, palpitations and swelling in legs/feet.  Gastrointestinal: Negative for abdominal pain, blood in stool, constipation and diarrhea.  Endocrine: Negative for increased urination.  Genitourinary: Negative for painful urination.  Musculoskeletal: Positive for arthralgias, joint pain, joint swelling, muscle weakness and morning stiffness. Negative for myalgias, muscle tenderness and myalgias.  Skin: Negative for color change, rash and redness.  Allergic/Immunologic: Negative for susceptible to infections.  Neurological: Negative for dizziness, numbness, headaches, memory loss and weakness.  Hematological: Negative for swollen glands.  Psychiatric/Behavioral: Positive for sleep disturbance. Negative for confusion.    PMFS History:  Patient Active Problem List   Diagnosis Date Noted  . Rheumatoid arthritis with rheumatoid factor of multiple sites without organ or systems involvement ([place]) 06/14/2020  . High risk medication use 06/14/2020  . Thyroid malignant neoplasm ([place]) 05/16/2020    Past Medical History:  Diagnosis Date  . Cancer ([place])    Thyroid  . Hyperlipidemia   . Hypertension   . Rheumatoid arthritis ([place])     Family History  Problem Relation Age of Onset  . Diabetes Mother   . Hypertension Mother   . Arthritis Mother   . Diabetes Father   . Hypertension Father   . Diabetes Brother   . Hypertension Brother   . Hypertension Brother    Past Surgical  History:  Procedure Laterality Date  . COLONOSCOPY WITH PROPOFOL    . MANDIBLE SURGERY    . THYROIDECTOMY Bilateral 05/16/2020   Procedure: TOTAL THYROIDECTOMY;  Surgeon: Clyde Canterbury, MD;  Location: [facility] ORS;  Service: ENT;  Laterality: Bilateral;   Social History   Social History Narrative  . Not on file   Immunization History   Administered Date(s) Administered  . PFIZER SARS-COV-2 Vaccination 08/25/2019, 09/20/2019     Objective: Vital Signs: BP 122/76 (BP Location: Right Arm, Patient Position: Sitting, Cuff Size: Normal)   Pulse 69   Ht 5\' 8"  (1.727 m)   Wt 185 lb 12.8 oz (84.3 kg)   BMI 28.25 kg/m    Physical Exam Constitutional:      Appearance: Normal appearance.  HENT:     Head: Normocephalic.     Right Ear: External ear normal.     Left Ear: External ear normal.     Mouth/Throat:     Mouth: Mucous membranes are moist.     Pharynx: Oropharynx is clear.  Eyes:     Conjunctiva/sclera: Conjunctivae normal.  Cardiovascular:     Rate and Rhythm: Normal rate and regular rhythm.  Pulmonary:     Effort: Pulmonary effort is normal.     Breath sounds: Normal breath sounds.  Skin:    General: Skin is warm and dry.     Findings: No rash.  Neurological:     General: No focal deficit present.     Mental Status: He is alert.      Musculoskeletal Exam:  Neck full range of motion no tenderness Shoulder pain with abduction while below shoulder height, left side pain reaching behind low back Normal elbows ROM Left wrist swollen and tender with pain on flexion or extension, wrist, fingers full range of motion no tenderness or swelling Normal hip internal and external rotation without pain, some lateral pain provoked with FABER maneuver Knees good ROM some patellofemoral crepitus MTPs full range of motion no tenderness or swelling   CDAI Exam: CDAI Score: 6.7  Patient Global: 5 mm; Provider Global: 2 mm Swollen: 1 ; Tender: 5  Joint Exam 06/14/2020      Right  Left  Glenohumeral   Tender   Tender  Wrist     Swollen Tender  MCP 2      Tender  MCP 3      Tender     Investigation: No additional findings.  Imaging: XR Hand 2 View Left  Result Date: 06/14/2020 X-ray left hand 2 views Radiocarpal joint space preserved carpal bones show cystic changes and osteoarthritis of the first [facility]  joint.  MCP joints appear fairly intact slight PIP narrowing second digit and DIP of 4th digit.  Cystic changes in 2nd and 5th middle phalanges.  No large joint osteophytes are seen.  Bone mineralization appears normal. Impression Mild osteoarthritis changes and numerous cystic changes question cystic versus erosive abnormalities in wrist  XR Hand 2 View Right  Result Date: 06/14/2020 X-ray right hand 2 views Radiocarpal joint space preserved carpal bones show numerous cystic changes and osteoarthritis of the first [facility] joint.  MCP joints appear fairly intact slight PIP narrowing second digit with some narrowing on radial aspect of DIP.  Numerous cystic changes especially impressive at fifth digit middle phalanx.  No large joint osteophytes are seen.  Bone mineralization appears normal. Impression Mild osteoarthritis changes and numerous cystic changes question cystic versus erosive abnormalities in wrist   Recent Labs: Lab Results  Component Value Date   WBC 9.7 05/14/2020   HGB 15.5 05/14/2020   PLT 297 05/14/2020   NA 140 05/14/2020   K 4.8 05/14/2020   CL 101 05/14/2020   CO2 30 05/14/2020   GLUCOSE 97 05/14/2020   BUN 17 05/14/2020   CREATININE 1.30 (H) 05/14/2020   CALCIUM 9.4 05/17/2020    Speciality Comments: No specialty comments available.  Procedures:  No procedures performed Allergies: Patient has no known allergies.   Assessment / Plan:     Visit Diagnoses: Rheumatoid arthritis with rheumatoid factor of multiple sites without organ or systems involvement ([place]) - Plan: XR Hand 2 View Right, XR Hand 2 View Left, methotrexate (RHEUMATREX) 2.5 MG tablet, predniSONE (DELTASONE) 5 MG tablet, DG Chest 2 View  Swelling and pain on exam today with positive RF and recent CRP was high consistent with RA. We will continue methotrexate that was started and will also recommend prednisone for the short term. 10mg  daily for next 2 weeks then 5mg  daily and will f/u at that point for MTX  monitoring. Xrays show extensive cystic changes more consistent with OA no erosions or demineralization seen.   High risk medication use - Plan: Folate, Hepatitis A antibody, IgM, Hepatitis B core antibody, IgM, Hepatitis B surface antigen, Hepatitis C antibody, DG Chest 2 View  Will obtain baseline monitoring for methotrexate use including viral hepatitis panel and baseline chest xray. He reports recent B12 shot. Folic acid 1 mg daily for MTX toxicity prophylaxis. Discussed need for monitoring for cytopenia, liver toxicity, ILD.  Orders: Orders Placed This Encounter  Procedures  . XR Hand 2 View Right  . XR Hand 2 View Left  . DG Chest 2 View  . Folate  . Hepatitis A antibody, IgM  . Hepatitis B core antibody, IgM  . Hepatitis B surface antigen  . Hepatitis C antibody   Meds ordered this encounter  Medications  . methotrexate (RHEUMATREX) 2.5 MG tablet    Sig: Take 6 tablets (15 mg total) by mouth once a week for 4 doses.    Dispense:  24 tablet    Refill:  0  . predniSONE (DELTASONE) 5 MG tablet    Sig: Take 2 tablets (10 mg total) by mouth daily with breakfast for 14 days, THEN 1 tablet (5 mg total) daily with breakfast for 14 days.    Dispense:  42 tablet    Refill:  0    Follow-Up Instructions: Return in about 4 weeks (around 07/12/2020).   Collier Salina, MD  Note - This record has been created using [employer].  Chart creation errors have been sought, but may not always  have been located. Such creation errors do not reflect on  the standard of medical care.

## 2020-06-14 ENCOUNTER — Ambulatory Visit (INDEPENDENT_AMBULATORY_CARE_PROVIDER_SITE_OTHER): Payer: Medicare Other

## 2020-06-14 ENCOUNTER — Encounter: Payer: Self-pay | Admitting: Internal Medicine

## 2020-06-14 ENCOUNTER — Ambulatory Visit (INDEPENDENT_AMBULATORY_CARE_PROVIDER_SITE_OTHER): Payer: Medicare Other | Admitting: Internal Medicine

## 2020-06-14 ENCOUNTER — Ambulatory Visit: Payer: Self-pay

## 2020-06-14 ENCOUNTER — Other Ambulatory Visit: Payer: Self-pay

## 2020-06-14 VITALS — BP 122/76 | HR 69 | Ht 68.0 in | Wt 185.8 lb

## 2020-06-14 DIAGNOSIS — M069 Rheumatoid arthritis, unspecified: Secondary | ICD-10-CM | POA: Insufficient documentation

## 2020-06-14 DIAGNOSIS — M0579 Rheumatoid arthritis with rheumatoid factor of multiple sites without organ or systems involvement: Secondary | ICD-10-CM | POA: Diagnosis not present

## 2020-06-14 DIAGNOSIS — Z79899 Other long term (current) drug therapy: Secondary | ICD-10-CM | POA: Diagnosis not present

## 2020-06-14 MED ORDER — PREDNISONE 5 MG PO TABS: ORAL_TABLET | ORAL | 0 refills | Status: AC

## 2020-06-14 MED ORDER — METHOTREXATE 2.5 MG PO TABS: 15.0000 mg | ORAL_TABLET | ORAL | 0 refills | Status: AC

## 2020-06-14 NOTE — Patient Instructions (Addendum)
I think your initial treatment plan is reasonable I would go to 6 tablets of methotrexate weekly. This medication takes at least several weeks to work so in the meantime I will prescribe prednisone for use to treat inflammation in the short term.  Take prednisone 10mg  daily for 2 weeks then decrease to 5mg  daily so we can follow up and see if this is controlling your inflammation.  Also take a folic acid vitamin 1mg  daily to reduce the risk of side effects from methotrexate.  I recommend having a chest xray obtained in order to safely monitor use of methotrexate, please have this done today or during this month before our follow up. I sent this order the [facility] but if you need this elsewhere contact us.  If you have new problems or trouble with the medication call us back sooner else we can see you in 1 month.   Rheumatoid Arthritis Rheumatoid arthritis (RA) is a long-term (chronic) disease that causes inflammation in your joints. RA may start slowly. It most often affects the small joints of the hands and feet. Usually, the same joints are affected on both sides of your body. Inflammation from RA can also affect other parts of your body, including your heart, eyes, or lungs. There is no cure for RA, but medicines can help your symptoms and halt or slow down the progression of the disease. What are the causes? RA is an autoimmune disease. When you have an autoimmune disease, your body's defense system (immune system) mistakenly attacks healthy body tissues. The exact cause of RA is not known. What increases the risk? You are more likely to develop this condition if you:  Are a woman.  Have a family history of RA or other autoimmune diseases.  Have a history of smoking.  Are obese.  Have been exposed to pollutants or chemicals. What are the signs or symptoms? The first symptom of this condition may be morning stiffness that lasts longer than 30  minutes.  Symptoms usually start gradually. They are often worse in the morning. As RA progresses, symptoms may include:  Pain, stiffness, swelling, warmth, and tenderness in joints on both sides of your body.  Loss of energy.  Loss of appetite.  Weight loss.  Low-grade fever.  Dry eyes and dry mouth.  Firm lumps (rheumatoid nodules) that grow beneath your skin in areas such as your forearm bones near your elbows and on your hands.  Changes in the appearance of joints (deformity) and loss of joint function. Symptoms of this condition vary from person to person.  Symptoms of RA often come and go.  Sometimes, symptoms get worse for a period of time. These are called flares. How is this diagnosed? This condition is diagnosed based on your symptoms, medical history, and physical exam.  You may have X-rays or an MRI to check for the type of joint changes that are caused by RA. You may also have blood tests to look for:  Proteins (antibodies) that your immune system may make if you have RA. These include rheumatoid factor (RF) and anti-CCP. ? When blood tests show these proteins, you are said to have "seropositive RA." ? When blood tests do not show these proteins, you may have "seronegative RA."  Inflammation in your blood.  A low number of red blood cells (anemia). How is this treated? The goals of treatment are to relieve pain, reduce inflammation, and slow down or stop joint damage and disability. Treatment may  include:  Lifestyle changes. It is important to rest as needed, eat a healthy diet, and exercise.  Medicines. Your health care provider may adjust your medicines every 3 months until treatment goals are reached. Common medicines include: ? Pain relievers (analgesics). ? Corticosteroids and NSAIDs to reduce inflammation. ? Disease-modifying antirheumatic drugs (DMARDs) to try to slow the course of the disease. ? Biologic response modifiers to reduce inflammation and  damage.  Physical therapy and occupational therapy.  Surgery, if you have severe joint damage. Joint replacement or fusing of joints may be needed. Your health care provider will work with you to identify the best treatment option for you based on assessment of the overall disease activity in your body. Follow these instructions at home: Activity  Return to your normal activities as told by your health care provider. Ask your health care provider what activities are safe for you.  Rest when you are having a flare.  Start an exercise program as told by your health care provider. General instructions  Keep all follow-up visits as told by your health care provider. This is important.  Take over-the-counter and prescription medicines only as told by your health care provider. Where to find more information  [employer]: www.rheumatology.[place]: www.arthritis.org Contact a health care provider if:  You have a flare-up of RA symptoms.  You have a fever.  You have side effects from your medicines. Get help right away if:  You have chest pain.  You have trouble breathing.  You quickly develop a hot, painful joint that is more severe than your usual joint aches. Summary  Rheumatoid arthritis (RA) is a long-term (chronic) disease that causes inflammation in your joints.  RA is an autoimmune disease.  The goals of treatment are to relieve pain, reduce inflammation, and slow down or stop joint damage and disability.    Anticyclic-Citrullinated Peptide Antibody Test Why am I having this test? You may have the anticyclic-citrullinated peptide antibody test done to help:  Diagnose rheumatoid arthritis (RA). RA is a long-term (chronic) disease that causes inflammation in the joints.  Determine the severity of your RA, including how much worse it is getting (progression). This test may be done if you have unexplained joint inflammation and  have previously tested negative for rheumatoid factor. It may also be done if you have been diagnosed with undifferentiated arthritis and your health care provider suspects rheumatoid arthritis. What is being tested? This test checks your blood for the presence of anticyclic-citrullinated peptide antibodies. Antibodies are cells that are part of the body's disease-fighting (immune) system. These antibodies appear early in the course of RA and are thought to be directly involved in the progression of the disease. What kind of sample is taken?  A blood sample is required for this test. It is usually collected by inserting a needle into a blood vessel. How are the results reported? Your test results will be reported as either positive or negative. A result is considered negative if there is less than 20 units of the antibody per mL of blood. What do the results mean?  A positive blood test may mean that you have RA.  A negative blood test means that it is less likely that you have RA. However, a negative test does not completely rule out rheumatoid arthritis. Talk with your health care provider about what your results mean. Questions to ask your health care provider Ask your health care provider, or the department that is doing  the test:  When will my results be ready?  How will I get my results?  What are my treatment options?  What other tests do I need?  What are my next steps? Summary  The anticyclic-citrullinated peptide antibody blood test may be done to help your health care provider diagnose rheumatoid arthritis (RA).  This test checks your blood for the presence of anticyclic-citrullinated peptide antibodies. These antibodies appear early in the course of RA.  A positive blood test may mean that you have RA.    Methotrexate tablets What is this medicine? METHOTREXATE (METH oh TREX ate) is a chemotherapy drug used to treat cancer including breast cancer, leukemia, and  lymphoma. This medicine can also be used to treat psoriasis and certain kinds of arthritis. This medicine may be used for other purposes; ask your health care provider or pharmacist if you have questions. COMMON BRAND NAME(S): Rheumatrex, Trexall What should I tell my health care provider before I take this medicine? They need to know if you have any of these conditions:  fluid in the stomach area or lungs  if you often drink alcohol  infection or immune system problems  kidney disease or on hemodialysis  liver disease  low blood counts, like low white cell, platelet, or red cell counts  lung disease  radiation therapy  stomach ulcers  ulcerative colitis  an unusual or allergic reaction to methotrexate, other medicines, foods, dyes, or preservatives  pregnant or trying to get pregnant  breast-feeding How should I use this medicine? Take this medicine by mouth with a glass of water. Follow the directions on the prescription label. Take your medicine at regular intervals. Do not take it more often than directed. Do not stop taking except on your doctor's advice. Make sure you know why you are taking this medicine and how often you should take it. If this medicine is used for a condition that is not cancer, like arthritis or psoriasis, it should be taken weekly, NOT daily. Taking this medicine more often than directed can cause serious side effects, even death. Talk to your healthcare provider about safe handling and disposal of this medicine. You may need to take special precautions. Talk to your pediatrician regarding the use of this medicine in children. While this drug may be prescribed for selected conditions, precautions do apply. Overdosage: If you think you have taken too much of this medicine contact a poison control center or emergency room at once. NOTE: This medicine is only for you. Do not share this medicine with others. What if I miss a dose? If you miss a dose,  talk with your doctor or health care professional. Do not take double or extra doses. What may interact with this medicine? This medicine may interact with the following medication:  acitretin  aspirin and aspirin-like medicines including salicylates  azathioprine  certain antibiotics like penicillins, tetracycline, and chloramphenicol  cyclosporine  gold  hydroxychloroquine  live virus vaccines  NSAIDs, medicines for pain and inflammation, like ibuprofen or naproxen  other cytotoxic agents  penicillamine  phenylbutazone  phenytoin  probenecid  retinoids such as isotretinoin and tretinoin  steroid medicines like prednisone or cortisone  sulfonamides like sulfasalazine and trimethoprim/sulfamethoxazole  theophylline This list may not describe all possible interactions. Give your health care provider a list of all the medicines, herbs, non-prescription drugs, or dietary supplements you use. Also tell them if you smoke, drink alcohol, or use illegal drugs. Some items may interact with your medicine. What should  I watch for while using this medicine? Avoid alcoholic drinks. This medicine can make you more sensitive to the sun. Keep out of the sun. If you cannot avoid being in the sun, wear protective clothing and use sunscreen. Do not use sun lamps or tanning beds/booths. You may need blood work done while you are taking this medicine. Call your doctor or health care professional for advice if you get a fever, chills or sore throat, or other symptoms of a cold or flu. Do not treat yourself. This drug decreases your body's ability to fight infections. Try to avoid being around people who are sick. This medicine may increase your risk to bruise or bleed. Call your doctor or health care professional if you notice any unusual bleeding. Check with your doctor or health care professional if you get an attack of severe diarrhea, nausea and vomiting, or if you sweat a lot. The loss  of too much body fluid can make it dangerous for you to take this medicine. Talk to your doctor about your risk of cancer. You may be more at risk for certain types of cancers if you take this medicine. Both men and women must use effective birth control with this medicine. Do not become pregnant while taking this medicine or until at least 1 normal menstrual cycle has occurred after stopping it. Women should inform their doctor if they wish to become pregnant or think they might be pregnant. Men should not father a child while taking this medicine and for 3 months after stopping it. There is a potential for serious side effects to an unborn child. Talk to your health care professional or pharmacist for more information. Do not breast-feed an infant while taking this medicine. What side effects may I notice from receiving this medicine? Side effects that you should report to your doctor or health care professional as soon as possible:  allergic reactions like skin rash, itching or hives, swelling of the face, lips, or tongue  breathing problems or shortness of breath  diarrhea  dry, nonproductive cough  low blood counts - this medicine may decrease the number of white blood cells, red blood cells and platelets. You may be at increased risk for infections and bleeding.  mouth sores  redness, blistering, peeling or loosening of the skin, including inside the mouth  signs of infection - fever or chills, cough, sore throat, pain or trouble passing urine  signs and symptoms of bleeding such as bloody or black, tarry stools; red or dark-brown urine; spitting up blood or brown material that looks like coffee grounds; red spots on the skin; unusual bruising or bleeding from the eye, gums, or nose  signs and symptoms of kidney injury like trouble passing urine or change in the amount of urine  signs and symptoms of liver injury like dark yellow or brown urine; general ill feeling or flu-like  symptoms; light-colored stools; loss of appetite; nausea; right upper belly pain; unusually weak or tired; yellowing of the eyes or skin Side effects that usually do not require medical attention (report to your doctor or health care professional if they continue or are bothersome):  dizziness  hair loss  tiredness  upset stomach  vomiting This list may not describe all possible side effects. Call your doctor for medical advice about side effects. You may report side effects to FDA at 1-800-FDA-1088. Where should I keep my medicine? Keep out of the reach of children.    Prednisone tablets What is this medicine?  PREDNISONE (PRED ni sone) is a corticosteroid. It is commonly used to treat inflammation of the skin, joints, lungs, and other organs. Common conditions treated include asthma, allergies, and arthritis. It is also used for other conditions, such as blood disorders and diseases of the adrenal glands. This medicine may be used for other purposes; ask your health care provider or pharmacist if you have questions. COMMON BRAND NAME(S): Deltasone, Predone, Sterapred, Sterapred DS What should I tell my health care provider before I take this medicine? They need to know if you have any of these conditions:  Cushing's syndrome  diabetes  glaucoma  heart disease  high blood pressure  infection (especially a virus infection such as chickenpox, cold sores, or herpes)  kidney disease  liver disease  mental illness  myasthenia gravis  osteoporosis  seizures  stomach or intestine problems  thyroid disease  an unusual or allergic reaction to lactose, prednisone, other medicines, foods, dyes, or preservatives  pregnant or trying to get pregnant  breast-feeding How should I use this medicine? Take this medicine by mouth with a glass of water. Follow the directions on the prescription label. Take this medicine with food. If you are taking this medicine once a day,  take it in the morning. Do not take more medicine than you are told to take. Do not suddenly stop taking your medicine because you may develop a severe reaction. Your doctor will tell you how much medicine to take. If your doctor wants you to stop the medicine, the dose may be slowly lowered over time to avoid any side effects. Talk to your pediatrician regarding the use of this medicine in children. Special care may be needed. Overdosage: If you think you have taken too much of this medicine contact a poison control center or emergency room at once. NOTE: This medicine is only for you. Do not share this medicine with others. What if I miss a dose? If you miss a dose, take it as soon as you can. If it is almost time for your next dose, talk to your doctor or health care professional. You may need to miss a dose or take an extra dose. Do not take double or extra doses without advice. What may interact with this medicine? Do not take this medicine with any of the following medications:  metyrapone  mifepristone This medicine may also interact with the following medications:  aminoglutethimide  amphotericin B  aspirin and aspirin-like medicines  barbiturates  certain medicines for diabetes, like glipizide or glyburide  cholestyramine  cholinesterase inhibitors  cyclosporine  digoxin  diuretics  ephedrine  male hormones, like estrogens and birth control pills  isoniazid  ketoconazole  NSAIDS, medicines for pain and inflammation, like ibuprofen or naproxen  phenytoin  rifampin  toxoids  vaccines  warfarin This list may not describe all possible interactions. Give your health care provider a list of all the medicines, herbs, non-prescription drugs, or dietary supplements you use. Also tell them if you smoke, drink alcohol, or use illegal drugs. Some items may interact with your medicine. What should I watch for while using this medicine? Visit your doctor or health  care professional for regular checks on your progress. If you are taking this medicine over a prolonged period, carry an identification card with your name and address, the type and dose of your medicine, and your doctor's name and address. This medicine may increase your risk of getting an infection. Tell your doctor or health care professional if you  are around anyone with measles or chickenpox, or if you develop sores or blisters that do not heal properly. If you are going to have surgery, tell your doctor or health care professional that you have taken this medicine within the last twelve months. Ask your doctor or health care professional about your diet. You may need to lower the amount of salt you eat. This medicine may increase blood sugar. Ask your healthcare provider if changes in diet or medicines are needed if you have diabetes. What side effects may I notice from receiving this medicine? Side effects that you should report to your doctor or health care professional as soon as possible:  allergic reactions like skin rash, itching or hives, swelling of the face, lips, or tongue  changes in emotions or moods  changes in vision  depressed mood  eye pain  fever or chills, cough, sore throat, pain or difficulty passing urine  signs and symptoms of high blood sugar such as being more thirsty or hungry or having to urinate more than normal. You may also feel very tired or have blurry vision.  swelling of ankles, feet Side effects that usually do not require medical attention (report to your doctor or health care professional if they continue or are bothersome):  confusion, excitement, restlessness  headache  nausea, vomiting  skin problems, acne, thin and shiny skin  trouble sleeping  weight gain This list may not describe all possible side effects. Call your doctor for medical advice about side effects. You may report side effects to FDA at 1-800-FDA-1088. Where should I  keep my medicine? Keep out of the reach of children. Store at room temperature between 15 and 30 degrees C (59 and 86 degrees F). Protect from light. Keep container tightly closed. Throw away any unused medicine after the expiration date. NOTE: This sheet is a summary. It may not cover all possible information. If you have questions about this medicine, talk to your doctor, pharmacist, or health care provider.  2020 [employer] (2018-03-16 10:54:22)

## 2020-06-15 LAB — FOLATE: Folate: 11 ng/mL

## 2020-06-15 LAB — HEPATITIS B CORE ANTIBODY, IGM: Hep B C IgM: NONREACTIVE

## 2020-06-15 LAB — HEPATITIS B SURFACE ANTIGEN: Hepatitis B Surface Ag: NONREACTIVE

## 2020-06-15 LAB — HEPATITIS A ANTIBODY, IGM: Hep A IgM: NONREACTIVE

## 2020-06-15 LAB — HEPATITIS C ANTIBODY
Hepatitis C Ab: NONREACTIVE
SIGNAL TO CUT-OFF: 0.01 (ref <1.00)

## 2020-06-19 NOTE — Progress Notes (Signed)
Screening for viral hepatitis is negative so no problems for taking the methotrexate. Hand xrays show mostly osteoarthritis so there could be some degenerative type joint pain in addition to the inflammation. No bone erosion or bone loss was seen related to this inflammation.

## 2020-07-05 ENCOUNTER — Telehealth: Payer: Self-pay

## 2020-07-05 NOTE — Telephone Encounter (Signed)
Attempted to contact patient, left voicemail advising patient his primary care doctor does not share the same EMR which would make it difficult for [place] to view the images and/or results. Advised patient, since he lives in [place], that he can go to [facility] to have his chest x-ray performed. Advised patient he is able to walk in and notify the radiology department there is an order in.

## 2020-07-05 NOTE — Telephone Encounter (Signed)
Patient called requesting a return call to let him know if he could get his chest x-ray with his PCP Dr. Patrecia Pace.

## 2020-07-09 ENCOUNTER — Ambulatory Visit
Admission: RE | Admit: 2020-07-09 | Discharge: 2020-07-09 | Disposition: A | Discharge status: Home / Self Care | Payer: Medicare Other | Source: Ambulatory Visit | Attending: Internal Medicine | Admitting: Internal Medicine

## 2020-07-09 DIAGNOSIS — M0579 Rheumatoid arthritis with rheumatoid factor of multiple sites without organ or systems involvement: Secondary | ICD-10-CM | POA: Diagnosis present

## 2020-07-09 DIAGNOSIS — Z79899 Other long term (current) drug therapy: Secondary | ICD-10-CM | POA: Insufficient documentation

## 2020-07-11 NOTE — Progress Notes (Signed)
Chest xray looks clear there is no lung inflammation or nodules so no concern of extra risk with methotrexate use.

## 2020-07-18 ENCOUNTER — Ambulatory Visit (INDEPENDENT_AMBULATORY_CARE_PROVIDER_SITE_OTHER): Payer: Medicare Other | Admitting: Internal Medicine

## 2020-07-18 ENCOUNTER — Encounter: Payer: Self-pay | Admitting: Internal Medicine

## 2020-07-18 ENCOUNTER — Ambulatory Visit: Payer: Medicare Other | Admitting: Internal Medicine

## 2020-07-18 ENCOUNTER — Other Ambulatory Visit: Payer: Self-pay

## 2020-07-18 VITALS — BP 146/87 | HR 79 | Ht 68.5 in | Wt 186.6 lb

## 2020-07-18 DIAGNOSIS — M0579 Rheumatoid arthritis with rheumatoid factor of multiple sites without organ or systems involvement: Secondary | ICD-10-CM | POA: Diagnosis not present

## 2020-07-18 DIAGNOSIS — C73 Malignant neoplasm of thyroid gland: Secondary | ICD-10-CM

## 2020-07-18 DIAGNOSIS — Z79899 Other long term (current) drug therapy: Secondary | ICD-10-CM

## 2020-07-18 MED ORDER — METHOTREXATE 2.5 MG PO TABS: 15.0000 mg | ORAL_TABLET | ORAL | 0 refills | Status: DC

## 2020-07-18 MED ORDER — PREDNISONE 10 MG PO TABS: 10.0000 mg | ORAL_TABLET | Freq: Every day | ORAL | 0 refills | Status: DC

## 2020-07-18 NOTE — Progress Notes (Signed)
Office Visit Note  Patient: Ralph Doyle             Date of Birth: August 04, 1950           MRN: [id number]             PCP: Lenard Simmer, MD Referring: Lenard Simmer, MD Visit Date: 07/18/2020   Subjective:  Follow-up (Patient complains of continued bilateral hand pain and swelling (left>right). )   History of Present Illness: Ralph Doyle is a 70 y.o. male here for evaluation of seropositive RA currently taking methotrexate 15mg  PO weekly. His symptoms started since about 3 months ago and followed thyroidectomy for thyroid cancer seen on FNA of a nodule. After the last visit he felt 10mg  prednisone was helpful for symptoms but since stopping this he is having more problems again. He has left wrist and hand swelling and much difficulty using this to play guitar as usual. His right hand is somewhat painful and has stiffness more in the 4th-5th digits but better than left. He denies any problems taking the methotrexate so far.   Review of Systems  Constitutional: Positive for fatigue.  HENT: Negative for mouth sores, mouth dryness and nose dryness.   Eyes: Positive for itching and dryness. Negative for pain and visual disturbance.  Respiratory: Negative for cough, hemoptysis, shortness of breath and difficulty breathing.   Cardiovascular: Negative for chest pain, palpitations and swelling in legs/feet.  Gastrointestinal: Negative for abdominal pain, blood in stool, constipation and diarrhea.  Endocrine: Negative for increased urination.  Genitourinary: Negative for painful urination.  Musculoskeletal: Positive for arthralgias, joint pain, joint swelling, myalgias, muscle weakness, morning stiffness, muscle tenderness and myalgias.  Skin: Negative for color change, rash and redness.  Allergic/Immunologic: Negative for susceptible to infections.  Neurological: Negative for dizziness, numbness, headaches, memory loss and weakness.  Hematological: Negative for swollen glands.   Psychiatric/Behavioral: Positive for sleep disturbance. Negative for confusion.    PMFS History:  Patient Active Problem List   Diagnosis Date Noted  . Rheumatoid arthritis with rheumatoid factor of multiple sites without organ or systems involvement ([place]) 06/14/2020  . High risk medication use 06/14/2020  . Thyroid malignant neoplasm ([place]) 05/16/2020    Past Medical History:  Diagnosis Date  . Cancer ([place])    Thyroid  . Hyperlipidemia   . Hypertension   . Rheumatoid arthritis ([place])     Family History  Problem Relation Age of Onset  . Diabetes Mother   . Hypertension Mother   . Arthritis Mother   . Diabetes Father   . Hypertension Father   . Diabetes Brother   . Hypertension Brother   . Hypertension Brother    Past Surgical History:  Procedure Laterality Date  . COLONOSCOPY WITH PROPOFOL    . MANDIBLE SURGERY    . THYROIDECTOMY Bilateral 05/16/2020   Procedure: TOTAL THYROIDECTOMY;  Surgeon: Clyde Canterbury, MD;  Location: [facility] ORS;  Service: ENT;  Laterality: Bilateral;   Social History   Social History Narrative  . Not on file   Immunization History  Administered Date(s) Administered  . PFIZER(Purple Top)SARS-COV-2 Vaccination 08/25/2019, 09/20/2019     Objective: Vital Signs: BP (!) 146/87 (BP Location: Left Arm, Patient Position: Sitting, Cuff Size: Normal)   Pulse 79   Ht 5' 8.5" (1.74 m)   Wt 186 lb 9.6 oz (84.6 kg)   BMI 27.96 kg/m    Physical Exam Eyes:     Conjunctiva/sclera: Conjunctivae normal.  Skin:    General:  Skin is warm and dry.     Findings: No rash.     Comments: Dry scaly skin over MCP knuckles of hands  Neurological:     General: No focal deficit present.     Mental Status: He is alert.  Psychiatric:        Mood and Affect: Mood normal.    Musculoskeletal Exam:  Neck full range of motion but has left sided neck pain Shoulders full ROM bilaterally left tenderness with abduction and internal rotation, elbows full ROM Right  wrist normal ROM, Left wrist swollen and tender Right 4th-5th digit contractures otherwise no synovitis, left hand limited extension, multiple flexor tendon nodules Knees, ankles, MTPs full range of motion no tenderness or swelling    CDAI Exam: CDAI Score: 9  Patient Global: 6 mm; Provider Global: 4 mm Swollen: 1 ; Tender: 7  Joint Exam 07/18/2020      Right  Left  Glenohumeral      Tender  Wrist     Swollen Tender  MCP 1      Tender  MCP 2      Tender  MCP 3      Tender  MCP 4      Tender  MCP 5      Tender     Investigation: No additional findings.  Imaging: DG Chest 2 View  Result Date: 07/09/2020 CLINICAL DATA:  70 year old male with history of thyroid cancer and hypertension methotrexate treatment EXAM: CHEST - 2 VIEW COMPARISON:  12/21/2008 FINDINGS: Cardiomediastinal silhouette unchanged in size and contour. No pneumothorax. No pleural effusion. No confluent airspace disease. No displaced fracture.  Mild scoliotic curvature of the spine. IMPRESSION: Negative for acute cardiopulmonary disease Electronically Signed   By: Corrie Mckusick D.O.   On: 07/09/2020 13:02    Recent Labs: Lab Results  Component Value Date   WBC 10.6 07/18/2020   HGB 14.2 07/18/2020   PLT 283 07/18/2020   NA 139 07/18/2020   K 4.4 07/18/2020   CL 104 07/18/2020   CO2 29 07/18/2020   GLUCOSE 71 07/18/2020   BUN 15 07/18/2020   CREATININE 1.11 07/18/2020   BILITOT 0.6 07/18/2020   AST 11 07/18/2020   ALT 10 07/18/2020   PROT 6.6 07/18/2020   CALCIUM 9.9 07/18/2020   GFRAA 79 07/18/2020    Speciality Comments: No specialty comments available.  Procedures:  No procedures performed Allergies: Patient has no known allergies.   Assessment / Plan:     Visit Diagnoses: Rheumatoid arthritis with rheumatoid factor of multiple sites without organ or systems involvement ([place]) - Plan: methotrexate (RHEUMATREX) 2.5 MG tablet, predniSONE (DELTASONE) 10 MG tablet, TSH  He has active joint  inflammation on exam again today and describes benefit was mostly just seen at 10mg  prednisone or greater dose. Recommend restarting at 20mg  dose initial then going back down to 10mg  while waiting for MTX to work, to avoid deconditioning and allow stretching and ROM exercises since he is currently quite limited. Continue methotrexate 15mg  PO weekly starting dose to early to assess for clinical response.  High risk medication use - Plan: CBC with Differential/Platelet, COMPLETE METABOLIC PANEL WITH GFR, TSH  Checking CBC, CMP for methotrexate toxicity monitoring.  Thyroid malignant neoplasm ([facility])  Checking TSH with extensive flexor tendon nodules and limited finger extension ROM cheiroarthropathy picture to rule out severely incorrect hormone replacement.  Orders: Orders Placed This Encounter  Procedures  . CBC with Differential/Platelet  . COMPLETE METABOLIC PANEL WITH GFR  .  TSH  . TSH   Meds ordered this encounter  Medications  . methotrexate (RHEUMATREX) 2.5 MG tablet    Sig: Take 6 tablets (15 mg total) by mouth once a week.    Dispense:  30 tablet    Refill:  0  . predniSONE (DELTASONE) 10 MG tablet    Sig: Take 1 tablet (10 mg total) by mouth daily with breakfast.    Dispense:  40 tablet    Refill:  0    Follow-Up Instructions: Return in about 4 weeks (around 08/15/2020).   Collier Salina, MD  Note - This record has been created using [employer].  Chart creation errors have been sought, but may not always  have been located. Such creation errors do not reflect on  the standard of medical care.

## 2020-07-19 ENCOUNTER — Ambulatory Visit: Payer: Medicare Other | Admitting: Internal Medicine

## 2020-07-19 LAB — COMPLETE METABOLIC PANEL WITH GFR
AG Ratio: 1.5 (calc) (ref 1.0–2.5)
ALT: 10 U/L (ref 9–46)
AST: 11 U/L (ref 10–35)
Albumin: 4 g/dL (ref 3.6–5.1)
Alkaline phosphatase (APISO): 59 U/L (ref 35–144)
BUN: 15 mg/dL (ref 7–25)
CO2: 29 mmol/L (ref 20–32)
Calcium: 9.9 mg/dL (ref 8.6–10.3)
Chloride: 104 mmol/L (ref 98–110)
Creat: 1.11 mg/dL (ref 0.70–1.25)
GFR, Est African American: 79 mL/min/{1.73_m2} (ref >=60)
GFR, Est Non African American: 68 mL/min/{1.73_m2} (ref >=60)
Globulin: 2.6 g/dL (calc) (ref 1.9–3.7)
Glucose, Bld: 71 mg/dL (ref 65–99)
Potassium: 4.4 mmol/L (ref 3.5–5.3)
Sodium: 139 mmol/L (ref 135–146)
Total Bilirubin: 0.6 mg/dL (ref 0.2–1.2)
Total Protein: 6.6 g/dL (ref 6.1–8.1)

## 2020-07-19 LAB — CBC WITH DIFFERENTIAL/PLATELET
Absolute Monocytes: 1124 cells/uL — ABNORMAL HIGH (ref 200–950)
Basophils Absolute: 42 cells/uL (ref 0–200)
Basophils Relative: 0.4 %
Eosinophils Absolute: 53 cells/uL (ref 15–500)
Eosinophils Relative: 0.5 %
HCT: 41.6 % (ref 38.5–50.0)
Hemoglobin: 14.2 g/dL (ref 13.2–17.1)
Lymphs Abs: 1685 cells/uL (ref 850–3900)
MCH: 31.3 pg (ref 27.0–33.0)
MCHC: 34.1 g/dL (ref 32.0–36.0)
MCV: 91.8 fL (ref 80.0–100.0)
MPV: 9.8 fL (ref 7.5–12.5)
Monocytes Relative: 10.6 %
Neutro Abs: 7696 cells/uL (ref 1500–7800)
Neutrophils Relative %: 72.6 %
Platelets: 283 10*3/uL (ref 140–400)
RBC: 4.53 10*6/uL (ref 4.20–5.80)
RDW: 16.5 % — ABNORMAL HIGH (ref 11.0–15.0)
Total Lymphocyte: 15.9 %
WBC: 10.6 10*3/uL (ref 3.8–10.8)

## 2020-07-19 LAB — TSH: TSH: 2.94 mIU/L (ref 0.40–4.50)

## 2020-07-20 NOTE — Progress Notes (Signed)
Lab tests look okay today no problems with the methotrexate affecting blood count or liver and kidney function tests. Can continue medication as discussed in clinic.

## 2020-08-15 ENCOUNTER — Other Ambulatory Visit: Payer: Self-pay

## 2020-08-15 ENCOUNTER — Ambulatory Visit (INDEPENDENT_AMBULATORY_CARE_PROVIDER_SITE_OTHER): Payer: Medicare Other | Admitting: Internal Medicine

## 2020-08-15 ENCOUNTER — Encounter: Payer: Self-pay | Admitting: Internal Medicine

## 2020-08-15 VITALS — BP 116/82 | HR 71 | Ht 68.5 in | Wt 194.2 lb

## 2020-08-15 DIAGNOSIS — M659 Synovitis and tenosynovitis, unspecified: Secondary | ICD-10-CM | POA: Diagnosis not present

## 2020-08-15 DIAGNOSIS — M0579 Rheumatoid arthritis with rheumatoid factor of multiple sites without organ or systems involvement: Secondary | ICD-10-CM | POA: Diagnosis not present

## 2020-08-15 DIAGNOSIS — Z79899 Other long term (current) drug therapy: Secondary | ICD-10-CM

## 2020-08-15 MED ORDER — PREDNISONE 5 MG PO TABS: 5.0000 mg | ORAL_TABLET | Freq: Every day | ORAL | 0 refills | Status: DC

## 2020-08-15 MED ORDER — METHOTREXATE 2.5 MG PO TABS: 15.0000 mg | ORAL_TABLET | ORAL | 2 refills | Status: AC

## 2020-08-15 NOTE — Progress Notes (Signed)
Office Visit Note  Patient: Ralph Doyle             Date of Birth: November 28, 1950           MRN: [id number]             PCP: Lenard Simmer, MD Referring: Lenard Simmer, MD Visit Date: 08/15/2020   Subjective:  Follow-up (Patient has noticed improvement in joint pain and stiffness since last visit. )   History of Present Illness: Ralph Doyle is a 70 y.o. male here for follow up of seropositive (RF+) RA currently taking methotrexate 15mg  PO weekly and prednisone 10mg  PO daily. His symptoms started 2021 following thyroidectomy for thyroid cancer seen on FNA of a nodule. After the last visit he resumed prednisone at 20 mg daily and then decreased to 10 mg daily and this was very helpful. Currently doing well, does have some left shoulder pain bothersome with reaching and lying to that side at night. He has been able to resume guitar playing a bit but his left hand hurts with long use and remains limited in movement.   Review of Systems  Constitutional: Negative for fatigue.  HENT: Positive for mouth sores. Negative for mouth dryness and nose dryness.   Eyes: Negative for pain, itching, visual disturbance and dryness.  Respiratory: Negative for cough, hemoptysis, shortness of breath and difficulty breathing.   Cardiovascular: Negative for chest pain, palpitations and swelling in legs/feet.  Gastrointestinal: Negative for abdominal pain, blood in stool, constipation and diarrhea.  Endocrine: Positive for increased urination.  Genitourinary: Negative for painful urination.  Musculoskeletal: Positive for arthralgias, joint pain, myalgias, muscle weakness, morning stiffness, muscle tenderness and myalgias. Negative for joint swelling.  Skin: Negative for color change, rash and redness.  Allergic/Immunologic: Negative for susceptible to infections.  Neurological: Positive for numbness. Negative for dizziness, headaches, memory loss and weakness.  Hematological: Negative for swollen  glands.  Psychiatric/Behavioral: Negative for confusion and sleep disturbance.    PMFS History:  Patient Active Problem List   Diagnosis Date Noted  . Flexor tenosynovitis of finger 08/15/2020  . Rheumatoid arthritis with rheumatoid factor of multiple sites without organ or systems involvement ([place]) 06/14/2020  . High risk medication use 06/14/2020  . Thyroid malignant neoplasm ([place]) 05/16/2020    Past Medical History:  Diagnosis Date  . Cancer ([place])    Thyroid  . Hyperlipidemia   . Hypertension   . Rheumatoid arthritis ([place])     Family History  Problem Relation Age of Onset  . Diabetes Mother   . Hypertension Mother   . Arthritis Mother   . Diabetes Father   . Hypertension Father   . Diabetes Brother   . Hypertension Brother   . Hypertension Brother    Past Surgical History:  Procedure Laterality Date  . COLONOSCOPY WITH PROPOFOL    . MANDIBLE SURGERY    . THYROIDECTOMY Bilateral 05/16/2020   Procedure: TOTAL THYROIDECTOMY;  Surgeon: Clyde Canterbury, MD;  Location: [facility] ORS;  Service: ENT;  Laterality: Bilateral;   Social History   Social History Narrative  . Not on file   Immunization History  Administered Date(s) Administered  . PFIZER(Purple Top)SARS-COV-2 Vaccination 08/25/2019, 09/20/2019     Objective: Vital Signs: BP 116/82 (BP Location: Left Arm, Patient Position: Sitting, Cuff Size: Normal)   Pulse 71   Ht 5' 8.5" (1.74 m)   Wt 194 lb 3.2 oz (88.1 kg)   BMI 29.10 kg/m    Physical Exam HENT:  Right Ear: External ear normal.     Left Ear: External ear normal.  Eyes:     Conjunctiva/sclera: Conjunctivae normal.  Cardiovascular:     Rate and Rhythm: Normal rate and regular rhythm.     Pulses: Normal pulses.     Heart sounds: Normal heart sounds.  Skin:    General: Skin is warm and dry.     Findings: No rash.  Neurological:     General: No focal deficit present.     Mental Status: He is alert.  Psychiatric:        Mood and Affect: Mood  normal.     Musculoskeletal Exam:  Shoulders left side painful arc positive, strength normal, no swelling, no focal tenderness, full passive ROM Elbows full ROM no tenderness or swelling Wrists full ROM no tenderness or swelling Fingers right hand 4th-5th digits limited extension ROM with flexor tendon contracture, left hand contracture of 2-5th digits with palpable flexor tendons on palmar surface, no active synovitis appreciated Knees full ROM no tenderness or swelling Ankles full ROM no tenderness or swelling   Investigation: No additional findings.  Imaging: No results found.  Recent Labs: Lab Results  Component Value Date   WBC 10.6 07/18/2020   HGB 14.2 07/18/2020   PLT 283 07/18/2020   NA 139 07/18/2020   K 4.4 07/18/2020   CL 104 07/18/2020   CO2 29 07/18/2020   GLUCOSE 71 07/18/2020   BUN 15 07/18/2020   CREATININE 1.11 07/18/2020   BILITOT 0.6 07/18/2020   AST 11 07/18/2020   ALT 10 07/18/2020   PROT 6.6 07/18/2020   CALCIUM 9.9 07/18/2020   GFRAA 79 07/18/2020    Speciality Comments: No specialty comments available.  Procedures:  No procedures performed Allergies: Patient has no known allergies.   Assessment / Plan:     Visit Diagnoses: Rheumatoid arthritis with rheumatoid factor of multiple sites without organ or systems involvement ([place]) - Plan: Sedimentation rate, methotrexate (RHEUMATREX) 2.5 MG tablet, predniSONE (DELTASONE) 5 MG tablet, C-reactive protein, Ambulatory referral to Orthopedic Surgery  Synovitis seems improved today no swollen joints while still on prednisone. Recommend tapering off prednisone by 5 mg each 2 weeks and continue MTX 15mg  weekly. Will titrate up dose if symptoms worsen.   High risk medication use - Plan: CBC with Differential/Platelet, COMPLETE METABOLIC PANEL WITH GFR  Checking CBC, CMP for MTX toxicity monitoring today.  Flexor tenosynovitis of finger - Plan: Ambulatory referral to Orthopedic Surgery  Flexion  deformities of the hand are limited by tendon changes, does not seem to be much active swelling currently. Referral to othropedic surgery may benefit with procedural intervention for contractures.  Orders: Orders Placed This Encounter  Procedures  . CBC with Differential/Platelet  . COMPLETE METABOLIC PANEL WITH GFR  . Sedimentation rate  . C-reactive protein  . Ambulatory referral to Orthopedic Surgery   Meds ordered this encounter  Medications  . methotrexate (RHEUMATREX) 2.5 MG tablet    Sig: Take 6 tablets (15 mg total) by mouth once a week.    Dispense:  30 tablet    Refill:  2  . predniSONE (DELTASONE) 5 MG tablet    Sig: Take 1 tablet (5 mg total) by mouth daily with breakfast.    Dispense:  20 tablet    Refill:  0     Follow-Up Instructions: Return in about 3 months (around 11/12/2020).   Collier Salina, MD  Note - This record has been created using [employer].  Chart creation errors have been sought, but may not always  have been located. Such creation errors do not reflect on  the standard of medical care.

## 2020-08-16 LAB — COMPLETE METABOLIC PANEL WITH GFR
AG Ratio: 1.7 (calc) (ref 1.0–2.5)
ALT: 8 U/L — ABNORMAL LOW (ref 9–46)
AST: 10 U/L (ref 10–35)
Albumin: 3.7 g/dL (ref 3.6–5.1)
Alkaline phosphatase (APISO): 47 U/L (ref 35–144)
BUN: 19 mg/dL (ref 7–25)
CO2: 30 mmol/L (ref 20–32)
Calcium: 9.5 mg/dL (ref 8.6–10.3)
Chloride: 103 mmol/L (ref 98–110)
Creat: 1.09 mg/dL (ref 0.70–1.25)
GFR, Est African American: 80 mL/min/{1.73_m2} (ref >=60)
GFR, Est Non African American: 69 mL/min/{1.73_m2} (ref >=60)
Globulin: 2.2 g/dL (calc) (ref 1.9–3.7)
Glucose, Bld: 100 mg/dL — ABNORMAL HIGH (ref 65–99)
Potassium: 4 mmol/L (ref 3.5–5.3)
Sodium: 141 mmol/L (ref 135–146)
Total Bilirubin: 0.5 mg/dL (ref 0.2–1.2)
Total Protein: 5.9 g/dL — ABNORMAL LOW (ref 6.1–8.1)

## 2020-08-16 LAB — CBC WITH DIFFERENTIAL/PLATELET
Absolute Monocytes: 930 cells/uL (ref 200–950)
Basophils Absolute: 62 cells/uL (ref 0–200)
Basophils Relative: 0.5 %
Eosinophils Absolute: 37 cells/uL (ref 15–500)
Eosinophils Relative: 0.3 %
HCT: 41.3 % (ref 38.5–50.0)
Hemoglobin: 13.9 g/dL (ref 13.2–17.1)
Lymphs Abs: 1228 cells/uL (ref 850–3900)
MCH: 31.7 pg (ref 27.0–33.0)
MCHC: 33.7 g/dL (ref 32.0–36.0)
MCV: 94.3 fL (ref 80.0–100.0)
MPV: 9.7 fL (ref 7.5–12.5)
Monocytes Relative: 7.5 %
Neutro Abs: 10143 cells/uL — ABNORMAL HIGH (ref 1500–7800)
Neutrophils Relative %: 81.8 %
Platelets: 261 10*3/uL (ref 140–400)
RBC: 4.38 10*6/uL (ref 4.20–5.80)
RDW: 16.6 % — ABNORMAL HIGH (ref 11.0–15.0)
Total Lymphocyte: 9.9 %
WBC: 12.4 10*3/uL — ABNORMAL HIGH (ref 3.8–10.8)

## 2020-08-16 LAB — C-REACTIVE PROTEIN: CRP: 3.9 mg/L (ref <8.0)

## 2020-08-16 LAB — SEDIMENTATION RATE: Sed Rate: 2 mm/h (ref 0–20)

## 2020-08-16 NOTE — Progress Notes (Signed)
Inflammatory markers are normal small elevations in white blood cell count are probably due to the prednisone. I recommend decreasing this stepwise as we discussed and hopefully continue just the methotrexate, but let us know if symptoms worsen a lot.

## 2020-10-12 DIAGNOSIS — M79641 Pain in right hand: Secondary | ICD-10-CM | POA: Insufficient documentation

## 2020-10-12 DIAGNOSIS — M79642 Pain in left hand: Secondary | ICD-10-CM | POA: Insufficient documentation

## 2020-11-06 ENCOUNTER — Encounter: Payer: Self-pay | Admitting: Internal Medicine

## 2020-11-06 ENCOUNTER — Ambulatory Visit (INDEPENDENT_AMBULATORY_CARE_PROVIDER_SITE_OTHER): Payer: Medicare Other | Admitting: Internal Medicine

## 2020-11-06 ENCOUNTER — Other Ambulatory Visit: Payer: Self-pay

## 2020-11-06 VITALS — BP 137/89 | HR 80 | Ht 68.0 in | Wt 192.6 lb

## 2020-11-06 DIAGNOSIS — M0579 Rheumatoid arthritis with rheumatoid factor of multiple sites without organ or systems involvement: Secondary | ICD-10-CM | POA: Diagnosis not present

## 2020-11-06 DIAGNOSIS — Z79899 Other long term (current) drug therapy: Secondary | ICD-10-CM

## 2020-11-06 NOTE — Progress Notes (Signed)
Office Visit Note  Patient: Ralph Doyle             Date of Birth: 08-08-1950           MRN: [id number]             PCP: Lenard Simmer, MD Referring: Lenard Simmer, MD Visit Date: 11/06/2020   Subjective:  Follow-up (Patient complains of bilateral shoulder pain. Patient was seen by Dr. Veverly Fells at [facility]. )   History of Present Illness: Ralph Doyle is a 70 y.o. 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  Since the last visit he followed up with Dr. Amedeo Plenty for evaluation of his flexor tendon nodule and decreased finger extension was treated with local steroid injection at the wrist and feels his symptoms are improved with this.  He was also referred and saw Dr. Alma Friendly with [facility] for shoulder evaluation with an unremarkable x-ray and is now planning for left shoulder MRI with some suspicion of a chronic shoulder tendinous injury or inflammation contributing to symptoms.    Review of Systems  Constitutional: Positive for fatigue.  HENT: Negative for mouth sores, mouth dryness and nose dryness.   Eyes: Positive for itching and dryness. Negative for pain and visual disturbance.  Respiratory: Negative for cough, hemoptysis, shortness of breath and difficulty breathing.   Cardiovascular: Negative for chest pain, palpitations and swelling in legs/feet.  Gastrointestinal: Negative for abdominal pain, blood in stool, constipation and diarrhea.  Endocrine: Negative for increased urination.  Genitourinary: Negative for painful urination.  Musculoskeletal: Positive for arthralgias, joint pain, myalgias, muscle tenderness and myalgias. Negative for joint swelling, muscle weakness and morning stiffness.  Skin: Negative for color change, rash and redness.  Allergic/Immunologic: Negative for susceptible to infections.  Neurological: Negative for dizziness, numbness, headaches, memory loss and weakness.  Hematological:  Negative for swollen glands.  Psychiatric/Behavioral: Positive for sleep disturbance. Negative for confusion.    PMFS History:  Patient Active Problem List   Diagnosis Date Noted  . Pain in right hand 10/12/2020  . Pain of left hand 10/12/2020  . Flexor tenosynovitis of finger 08/15/2020  . Rheumatoid arthritis with rheumatoid factor of multiple sites without organ or systems involvement ([place]) 06/14/2020  . High risk medication use 06/14/2020  . Thyroid malignant neoplasm ([place]) 05/16/2020    Past Medical History:  Diagnosis Date  . Cancer ([place])    Thyroid  . Hyperlipidemia   . Hypertension   . Rheumatoid arthritis ([place])     Family History  Problem Relation Age of Onset  . Diabetes Mother   . Hypertension Mother   . Arthritis Mother   . Diabetes Father   . Hypertension Father   . Diabetes Brother   . Hypertension Brother   . Hypertension Brother    Past Surgical History:  Procedure Laterality Date  . COLONOSCOPY WITH PROPOFOL    . MANDIBLE SURGERY    . THYROIDECTOMY Bilateral 05/16/2020   Procedure: TOTAL THYROIDECTOMY;  Surgeon: Clyde Canterbury, MD;  Location: [facility] ORS;  Service: ENT;  Laterality: Bilateral;   Social History   Social History Narrative  . Not on file   Immunization History  Administered Date(s) Administered  . PFIZER(Purple Top)SARS-COV-2 Vaccination 08/25/2019, 09/20/2019     Objective: Vital Signs: BP 137/89 (BP Location: Right Arm, Patient Position: Sitting, Cuff Size: Normal)   Pulse 80   Ht 5\' 8"  (1.727 m)   Wt 192 lb 9.6 oz (87.4 kg)  BMI 29.28 kg/m    Physical Exam Skin:    General: Skin is warm and dry.     Findings: No rash.  Neurological:     Mental Status: He is alert.  Psychiatric:        Mood and Affect: Mood normal.     Musculoskeletal Exam:  Shoulders full ROM left shoulder some tenderness with resisted abduction and Neer maneuver Elbows full ROM no tenderness or swelling Wrists full ROM no tenderness or  swelling Fingers slightly reduced extension and left second through fifth digits with contracture or tendon nodulocystic present on palm surface but no palpable synovitis Knees full ROM no tenderness or swelling Ankles full ROM no tenderness or swelling  CDAI Exam: CDAI Score: 5  Patient Global: 20 mm; Provider Global: 20 mm Swollen: 0 ; Tender: 1  Joint Exam 11/06/2020      Right  Left  Glenohumeral      Tender     Investigation: No additional findings.  Imaging: No results found.  Recent Labs: Lab Results  Component Value Date   WBC 9.5 11/06/2020   HGB 15.7 11/06/2020   PLT 343 11/06/2020   NA 142 11/06/2020   K 4.4 11/06/2020   CL 104 11/06/2020   CO2 29 11/06/2020   GLUCOSE 77 11/06/2020   BUN 13 11/06/2020   CREATININE 1.24 11/06/2020   BILITOT 0.7 11/06/2020   AST 10 11/06/2020   ALT 8 (L) 11/06/2020   PROT 6.6 11/06/2020   CALCIUM 9.9 11/06/2020   GFRAA 69 11/06/2020    Speciality Comments: No specialty comments available.  Procedures:  No procedures performed Allergies: Patient has no known allergies.   Assessment / Plan:     Visit Diagnoses: Rheumatoid arthritis with rheumatoid factor of multiple sites without organ or systems involvement ([place]) - Plan: Sedimentation rate, methotrexate (RHEUMATREX) 2.5 MG tablet  Symptoms appear reasonably well controlled today there is no synovitis the main tender joint is his left shoulder which is under evaluation with orthopedics does not sound that RA activity is the most likely explanation.  Between treatment and his local steroid injections hands are doing well.  He has some nonspecific generalized type body aches such as the low back and knees that he notices more with discontinuing the prednisone fully but no particular inflammatory changes.  We will recheck sedimentation rate for systemic inflammatory evidence.  Continue methotrexate 12.5 mg p.o. weekly at this time.  High risk medication use - Plan: CBC with  Differential/Platelet, COMPLETE METABOLIC PANEL WITH GFR  Methotrexate toxicity monitoring for cytopenias and hepatotoxicity we will check CBC and CMP today.  Orders: Orders Placed This Encounter  Procedures  . CBC with Differential/Platelet  . COMPLETE METABOLIC PANEL WITH GFR  . Sedimentation rate   Meds ordered this encounter  Medications  . methotrexate (RHEUMATREX) 2.5 MG tablet    Sig: Take 5 tablets (12.5 mg total) by mouth once a week. Caution:Chemotherapy. Protect from light.    Dispense:  60 tablet    Refill:  0     Follow-Up Instructions: Return in about 3 months (around 02/06/2021) for RA f/u.   Collier Salina, MD  Note - This record has been created using [employer].  Chart creation errors have been sought, but may not always  have been located. Such creation errors do not reflect on  the standard of medical care.

## 2020-11-07 LAB — COMPLETE METABOLIC PANEL WITH GFR
AG Ratio: 1.6 (calc) (ref 1.0–2.5)
ALT: 8 U/L — ABNORMAL LOW (ref 9–46)
AST: 10 U/L (ref 10–35)
Albumin: 4.1 g/dL (ref 3.6–5.1)
Alkaline phosphatase (APISO): 65 U/L (ref 35–144)
BUN: 13 mg/dL (ref 7–25)
CO2: 29 mmol/L (ref 20–32)
Calcium: 9.9 mg/dL (ref 8.6–10.3)
Chloride: 104 mmol/L (ref 98–110)
Creat: 1.24 mg/dL (ref 0.70–1.25)
GFR, Est African American: 69 mL/min/{1.73_m2} (ref >=60)
GFR, Est Non African American: 59 mL/min/{1.73_m2} — ABNORMAL LOW (ref >=60)
Globulin: 2.5 g/dL (calc) (ref 1.9–3.7)
Glucose, Bld: 77 mg/dL (ref 65–99)
Potassium: 4.4 mmol/L (ref 3.5–5.3)
Sodium: 142 mmol/L (ref 135–146)
Total Bilirubin: 0.7 mg/dL (ref 0.2–1.2)
Total Protein: 6.6 g/dL (ref 6.1–8.1)

## 2020-11-07 LAB — CBC WITH DIFFERENTIAL/PLATELET
Absolute Monocytes: 903 cells/uL (ref 200–950)
Basophils Absolute: 38 cells/uL (ref 0–200)
Basophils Relative: 0.4 %
Eosinophils Absolute: 48 cells/uL (ref 15–500)
Eosinophils Relative: 0.5 %
HCT: 48.5 % (ref 38.5–50.0)
Hemoglobin: 15.7 g/dL (ref 13.2–17.1)
Lymphs Abs: 1758 cells/uL (ref 850–3900)
MCH: 31 pg (ref 27.0–33.0)
MCHC: 32.4 g/dL (ref 32.0–36.0)
MCV: 95.8 fL (ref 80.0–100.0)
MPV: 9.7 fL (ref 7.5–12.5)
Monocytes Relative: 9.5 %
Neutro Abs: 6755 cells/uL (ref 1500–7800)
Neutrophils Relative %: 71.1 %
Platelets: 343 10*3/uL (ref 140–400)
RBC: 5.06 10*6/uL (ref 4.20–5.80)
RDW: 13.1 % (ref 11.0–15.0)
Total Lymphocyte: 18.5 %
WBC: 9.5 10*3/uL (ref 3.8–10.8)

## 2020-11-07 LAB — SEDIMENTATION RATE: Sed Rate: 11 mm/h (ref 0–20)

## 2020-11-07 MED ORDER — METHOTREXATE 2.5 MG PO TABS: 12.5000 mg | ORAL_TABLET | ORAL | 0 refills | Status: AC

## 2020-11-07 NOTE — Progress Notes (Signed)
Blood test looks fine there is no problem with blood cell count or liver function he can continue the methotrexate at the current dose. New Rx sent to pharmacy today.

## 2020-11-15 ENCOUNTER — Ambulatory Visit: Payer: Medicare Other | Admitting: Internal Medicine

## 2020-11-30 ENCOUNTER — Telehealth: Payer: Self-pay

## 2020-11-30 ENCOUNTER — Telehealth: Payer: Self-pay | Admitting: Internal Medicine

## 2020-11-30 DIAGNOSIS — M0579 Rheumatoid arthritis with rheumatoid factor of multiple sites without organ or systems involvement: Secondary | ICD-10-CM

## 2020-11-30 MED ORDER — PREDNISONE 10 MG PO TABS
10.0000 mg | ORAL_TABLET | Freq: Every day | ORAL | 1 refills | Status: DC
Start: 2020-11-30 — End: 2021-04-30

## 2020-11-30 NOTE — Telephone Encounter (Signed)
Patient left a voicemail requesting a return call regarding the pain that he is having.

## 2020-11-30 NOTE — Telephone Encounter (Signed)
I will send new prescription for 10 mg prednisone daily, which previously seemed to be helping symptoms. He can trying restarting this medicine for current joint pain. I would like to avoid going back to higher doses than this if possible, if prednisone doesn't help at this dose I would recommend we take another look at clinic to decide about it.

## 2020-11-30 NOTE — Telephone Encounter (Signed)
LMOM for patient

## 2020-11-30 NOTE — Telephone Encounter (Signed)
Patient calling due to pain he is having. Pain is all over, both knees, shoulders, hands. MTX does not seem to be helping. Patient would like to know if there is anything else Dr. Benjamine Mola would recommend for the pain? Patient uses [facility].

## 2020-11-30 NOTE — Telephone Encounter (Signed)
That is unfortunate to hear he is having a lot more pain. We could go back to treating with the prednisone although it might be good to see his upcoming ortho appointment first in case they are recommending any treatment based on his symptoms and the MRI finding. Does he have medication at home or needs a new prescription to resume this if no other new treatment is recommended next week?

## 2020-12-04 DIAGNOSIS — M75100 Unspecified rotator cuff tear or rupture of unspecified shoulder, not specified as traumatic: Secondary | ICD-10-CM | POA: Insufficient documentation

## 2020-12-04 DIAGNOSIS — M12819 Other specific arthropathies, not elsewhere classified, unspecified shoulder: Secondary | ICD-10-CM | POA: Insufficient documentation

## 2021-02-06 NOTE — Progress Notes (Signed)
Office Visit Note  Patient: Ralph Doyle             Date of Birth: 02-Mar-1951           MRN: [id number]             PCP: Lenard Simmer, MD Referring: Lenard Simmer, MD Visit Date: 02/07/2021   Subjective:  History of Present Illness: Ralph Doyle is a 70 y.o. 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 He saw Dr. Veverly Fells with bilateral shoulder steroid injection for the rotator cuff arthropathy and felt a large symptom improvement. He hs started to notice some increase in shoulder symptoms again recently and in his right 2nd finger. He does report an increase in use with recent cutting tree hedges with lifting tools overhead and climbing ladders.   Previous HPI: 11/06/20 Ralph Doyle is a 70 y.o. 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  Since the last visit he followed up with Dr. Amedeo Plenty for evaluation of his flexor tendon nodule and decreased finger extension was treated with local steroid injection at the wrist and feels his symptoms are improved with this.  He was also referred and saw Dr. Alma Friendly with [facility] for shoulder evaluation with an unremarkable x-ray and is now planning for left shoulder MRI with some suspicion of a chronic shoulder tendinous injury or inflammation contributing to symptoms.  08/15/20 Ralph Doyle is a 70 y.o. male here for follow up of seropositive (RF+) RA currently taking methotrexate '15mg'$  PO weekly and prednisone '10mg'$  PO daily. His symptoms started 2021 following thyroidectomy for thyroid cancer seen on FNA of a nodule. After the last visit he resumed prednisone at 20 mg daily and then decreased to 10 mg daily and this was very helpful. Currently doing well, does have some left shoulder pain bothersome with reaching and lying to that side at night. He has been able to resume guitar playing a  bit but his left hand hurts with long use and remains limited in movement.  07/18/20 Ralph Doyle is a 70 y.o. male here for evaluation of seropositive RA currently taking methotrexate '15mg'$  PO weekly. His symptoms started since about 3 months ago and followed thyroidectomy for thyroid cancer seen on FNA of a nodule. After the last visit he felt '10mg'$  prednisone was helpful for symptoms but since stopping this he is having more problems again. He has left wrist and hand swelling and much difficulty using this to play guitar as usual. His right hand is somewhat painful and has stiffness more in the 4th-5th digits but better than left. He denies any problems taking the methotrexate so far.  Review of Systems  Constitutional:  Negative for fatigue.  HENT:  Positive for mouth dryness and nose dryness. Negative for mouth sores.   Eyes:  Positive for dryness. Negative for pain and itching.  Respiratory:  Negative for shortness of breath and difficulty breathing.   Cardiovascular:  Negative for chest pain and palpitations.  Gastrointestinal:  Negative for blood in stool, constipation and diarrhea.  Endocrine: Negative for increased urination.  Genitourinary:  Positive for urgency. Negative for difficulty urinating.  Musculoskeletal:  Positive for joint pain and joint pain. Negative for joint swelling, myalgias, morning stiffness, muscle tenderness and myalgias.  Skin:  Negative for color change, rash and redness.  Allergic/Immunologic: Negative for susceptible to infections.  Neurological:  Negative for dizziness, numbness, headaches, memory loss and weakness.  Hematological:  Negative for bruising/bleeding tendency.  Psychiatric/Behavioral:  Negative for confusion.    PMFS History:  Patient Active Problem List   Diagnosis Date Noted   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   Pain in right hand 10/12/2020   Pain of left hand 10/12/2020   Flexor tenosynovitis of finger 08/15/2020   Rheumatoid  arthritis with rheumatoid factor of multiple sites without organ or systems involvement ([place]) 06/14/2020   High risk medication use 06/14/2020   Thyroid malignant neoplasm ([place]) 05/16/2020    Past Medical History:  Diagnosis Date   Cancer ([place])    Thyroid   Hyperlipidemia    Hypertension    Rheumatoid arthritis ([place])     Family History  Problem Relation Age of Onset   Diabetes Mother    Hypertension Mother    Arthritis Mother    Diabetes Father    Hypertension Father    Diabetes Brother    Hypertension Brother    Hypertension Brother    Past Surgical History:  Procedure Laterality Date   COLONOSCOPY WITH PROPOFOL     MANDIBLE SURGERY     THYROIDECTOMY Bilateral 05/16/2020   Procedure: TOTAL THYROIDECTOMY;  Surgeon: Clyde Canterbury, MD;  Location: [facility] ORS;  Service: ENT;  Laterality: Bilateral;   Social History   Social History Narrative   Not on file   Immunization History  Administered Date(s) Administered   PFIZER(Purple Top)SARS-COV-2 Vaccination 08/25/2019, 09/20/2019     Objective: Vital Signs: BP (!) 142/96 (BP Location: Left Arm, Patient Position: Sitting, Cuff Size: Normal)   Pulse 60   Resp 16   Ht '5\' 8"'$  (1.727 m)   Wt 192 lb (87.1 kg)   BMI 29.19 kg/m    Physical Exam Cardiovascular:     Rate and Rhythm: Normal rate and regular rhythm.  Pulmonary:     Effort: Pulmonary effort is normal.     Breath sounds: Normal breath sounds.  Skin:    General: Skin is warm and dry.     Findings: No rash.  Neurological:     General: No focal deficit present.     Mental Status: He is alert.  Psychiatric:        Mood and Affect: Mood normal.     Musculoskeletal Exam:  Shoulders full ROM, mild tenderness with neers and hawkins Elbows full ROM no tenderness or swelling Wrists full ROM no tenderness or swelling Fingers tenderness to pressure over right 2nd MCP without synovitis, 4th-5th fingers slightly decreased extension ROM with palmar tendon flexion  contracture Hip normal internal and external rotation, mild left hip tenderness to pressure over head of greater trochanter Knees full ROM no tenderness or swelling Ankles full ROM no tenderness or swelling  CDAI Exam: CDAI Score: -- Patient Global: --; Provider Global: -- Swollen: 0 ; Tender: 3  Joint Exam 02/07/2021      Right  Left  Glenohumeral   Tender   Tender  MCP 2   Tender        Investigation: No additional findings.  Imaging: No results found.  Recent Labs: Lab Results  Component Value Date   WBC 9.5 11/06/2020   HGB 15.7 11/06/2020   PLT 343 11/06/2020   NA 142 11/06/2020   K 4.4 11/06/2020   CL 104 11/06/2020   CO2 29 11/06/2020   GLUCOSE 77 11/06/2020   BUN 13 11/06/2020   CREATININE 1.24 11/06/2020   BILITOT 0.7 11/06/2020  AST 10 11/06/2020   ALT 8 (L) 11/06/2020   PROT 6.6 11/06/2020   CALCIUM 9.9 11/06/2020   GFRAA 69 11/06/2020    Speciality Comments: No specialty comments available.  Procedures:  No procedures performed Allergies: Patient has no known allergies.   Assessment / Plan:     Visit Diagnoses: Rheumatoid arthritis with rheumatoid factor of multiple sites without organ or systems involvement ([place])  Pain in right hand - Plan: Sedimentation rate  Symptoms appear fairly well controlled overall there is some increase at bilateral shoulders and right finger pain but without objective synovitis and not interfering with activities much.  Recommend continuing methotrexate 12.5 mg p.o. weekly and folic acid 1 mg p.o. daily.  Checking sedimentation rate today as well.  Rotator cuff tear arthropathy of both shoulders  Shoulder symptoms doing well strength and range of motion is normal today with only very mild tenderness.  Discussed nonpharmacologic treatment such as physical therapy or massage therapy I am not sure to which extent insurance coverage will assist we will provide written prescription today.  High risk medication use - Plan:  CBC with Differential/Platelet, COMPLETE METABOLIC PANEL WITH GFR  Medication monitoring for long-term methotrexate use checking CBC and CMP today.   Orders: Orders Placed This Encounter  Procedures   Sedimentation rate   CBC with Differential/Platelet   COMPLETE METABOLIC PANEL WITH GFR    Meds ordered this encounter  Medications   methotrexate (RHEUMATREX) 2.5 MG tablet    Sig: Take 5 tablets (12.5 mg total) by mouth once a week. Take 5 tablets by mouth once weekly. Caution:Chemotherapy. Protect from light.    Dispense:  65 tablet    Refill:  0   folic acid (FOLVITE) 1 MG tablet    Sig: Take 1 tablet (1 mg total) by mouth daily.    Dispense:  90 tablet    Refill:  0      Follow-Up Instructions: Return in about 3 months (around 05/10/2021) for RA on MTX f/u 72mo.   CCollier Salina MD  Note - This record has been created using [employer]  Chart creation errors have been sought, but may not always  have been located. Such creation errors do not reflect on  the standard of medical care.

## 2021-02-07 ENCOUNTER — Encounter: Payer: Self-pay | Admitting: Internal Medicine

## 2021-02-07 ENCOUNTER — Ambulatory Visit (INDEPENDENT_AMBULATORY_CARE_PROVIDER_SITE_OTHER): Payer: Medicare Other | Admitting: Internal Medicine

## 2021-02-07 ENCOUNTER — Other Ambulatory Visit: Payer: Self-pay

## 2021-02-07 VITALS — BP 142/96 | HR 60 | Resp 16 | Ht 68.0 in | Wt 192.0 lb

## 2021-02-07 DIAGNOSIS — M79641 Pain in right hand: Secondary | ICD-10-CM

## 2021-02-07 DIAGNOSIS — M12812 Other specific arthropathies, not elsewhere classified, left shoulder: Secondary | ICD-10-CM

## 2021-02-07 DIAGNOSIS — M75102 Unspecified rotator cuff tear or rupture of left shoulder, not specified as traumatic: Secondary | ICD-10-CM

## 2021-02-07 DIAGNOSIS — Z79899 Other long term (current) drug therapy: Secondary | ICD-10-CM

## 2021-02-07 DIAGNOSIS — M0579 Rheumatoid arthritis with rheumatoid factor of multiple sites without organ or systems involvement: Secondary | ICD-10-CM

## 2021-02-07 DIAGNOSIS — M75101 Unspecified rotator cuff tear or rupture of right shoulder, not specified as traumatic: Secondary | ICD-10-CM

## 2021-02-07 DIAGNOSIS — M12811 Other specific arthropathies, not elsewhere classified, right shoulder: Secondary | ICD-10-CM

## 2021-02-07 MED ORDER — METHOTREXATE 2.5 MG PO TABS: 12.5000 mg | ORAL_TABLET | ORAL | 0 refills | Status: DC

## 2021-02-07 MED ORDER — FOLIC ACID 1 MG PO TABS: 1.0000 mg | ORAL_TABLET | Freq: Every day | ORAL | 0 refills | Status: DC

## 2021-02-07 NOTE — Patient Instructions (Signed)
I recommend continuing current treatment plan for your RA and checking lab test to make sure no problems with this.  I recommend you can try massage treatment this is often beneficial for problems such as shoulder pain and stiffness but not for everyone. I do not know to what extent insurance will help with any particular office, you can try asking directly and a written script provided for this if they are able to.

## 2021-02-08 LAB — COMPLETE METABOLIC PANEL WITH GFR
AG Ratio: 1.9 (calc) (ref 1.0–2.5)
ALT: 11 U/L (ref 9–46)
AST: 11 U/L (ref 10–35)
Albumin: 4.2 g/dL (ref 3.6–5.1)
Alkaline phosphatase (APISO): 55 U/L (ref 35–144)
BUN: 15 mg/dL (ref 7–25)
CO2: 30 mmol/L (ref 20–32)
Calcium: 9.9 mg/dL (ref 8.6–10.3)
Chloride: 104 mmol/L (ref 98–110)
Creat: 1.09 mg/dL (ref 0.70–1.35)
Globulin: 2.2 g/dL (calc) (ref 1.9–3.7)
Glucose, Bld: 62 mg/dL — ABNORMAL LOW (ref 65–99)
Potassium: 4.8 mmol/L (ref 3.5–5.3)
Sodium: 141 mmol/L (ref 135–146)
Total Bilirubin: 0.6 mg/dL (ref 0.2–1.2)
Total Protein: 6.4 g/dL (ref 6.1–8.1)
eGFR: 74 mL/min/{1.73_m2} (ref >=60)

## 2021-02-08 LAB — CBC WITH DIFFERENTIAL/PLATELET
Absolute Monocytes: 865 cells/uL (ref 200–950)
Basophils Absolute: 48 cells/uL (ref 0–200)
Basophils Relative: 0.5 %
Eosinophils Absolute: 29 cells/uL (ref 15–500)
Eosinophils Relative: 0.3 %
HCT: 46.9 % (ref 38.5–50.0)
Hemoglobin: 15.5 g/dL (ref 13.2–17.1)
Lymphs Abs: 1834 cells/uL (ref 850–3900)
MCH: 31.5 pg (ref 27.0–33.0)
MCHC: 33 g/dL (ref 32.0–36.0)
MCV: 95.3 fL (ref 80.0–100.0)
MPV: 9.5 fL (ref 7.5–12.5)
Monocytes Relative: 9.1 %
Neutro Abs: 6726 cells/uL (ref 1500–7800)
Neutrophils Relative %: 70.8 %
Platelets: 283 10*3/uL (ref 140–400)
RBC: 4.92 10*6/uL (ref 4.20–5.80)
RDW: 15.3 % — ABNORMAL HIGH (ref 11.0–15.0)
Total Lymphocyte: 19.3 %
WBC: 9.5 10*3/uL (ref 3.8–10.8)

## 2021-02-08 LAB — SEDIMENTATION RATE: Sed Rate: 11 mm/h (ref 0–20)

## 2021-02-08 NOTE — Progress Notes (Signed)
Labs look fine for continuing methotrexate no new changes.

## 2021-04-30 ENCOUNTER — Encounter: Payer: Self-pay | Admitting: Internal Medicine

## 2021-04-30 ENCOUNTER — Ambulatory Visit (INDEPENDENT_AMBULATORY_CARE_PROVIDER_SITE_OTHER): Payer: Medicare Other | Admitting: Internal Medicine

## 2021-04-30 ENCOUNTER — Other Ambulatory Visit: Payer: Self-pay

## 2021-04-30 VITALS — BP 143/80 | HR 71 | Resp 15 | Ht 68.5 in | Wt 195.0 lb

## 2021-04-30 DIAGNOSIS — Z79899 Other long term (current) drug therapy: Secondary | ICD-10-CM | POA: Diagnosis not present

## 2021-04-30 DIAGNOSIS — M0579 Rheumatoid arthritis with rheumatoid factor of multiple sites without organ or systems involvement: Secondary | ICD-10-CM

## 2021-04-30 MED ORDER — METHOTREXATE 2.5 MG PO TABS: 12.5000 mg | ORAL_TABLET | ORAL | 0 refills | Status: DC

## 2021-04-30 MED ORDER — FOLIC ACID 1 MG PO TABS: 1.0000 mg | ORAL_TABLET | Freq: Every day | ORAL | 1 refills | Status: DC

## 2021-04-30 MED ORDER — PREDNISONE 5 MG PO TABS: 5.0000 mg | ORAL_TABLET | Freq: Every day | ORAL | 2 refills | Status: DC

## 2021-04-30 NOTE — Progress Notes (Signed)
Office Visit Note  Patient: Ralph Doyle      Date of Birth: 05-Jan-1951 MRN: [id number]         PCP: Lenard Simmer, MD Referring: Lenard Simmer, MD Visit Date: 04/30/2021   Subjective:  Rheumatoid Arthritis (Currently taking prednisone for total body pain)    History of Present Illness: Ralph Doyle is a 70 y.o. 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  Symptoms have been doing reasonably well but he experienced an exacerbation of joint pain especially affecting bilateral shoulders hands and knees.  This came about when he was on a trip at the beach thinks may be a change in physical activity or just the environment.  He restarted taking the previous prednisone at 10 mg daily which almost completely improved his symptoms quickly and has continued this for the past 1 month.    Previous HPI 02/07/21 Ralph Doyle is a 70 y.o. 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 He saw Dr. Veverly Fells with bilateral shoulder steroid injection for the rotator cuff arthropathy and felt a large symptom improvement. He hs started to notice some increase in shoulder symptoms again recently and in his right 2nd finger. He does report an increase in use with recent cutting tree hedges with lifting tools overhead and climbing ladders.    07/18/20 Ralph Doyle is a 70 y.o. male here for evaluation of seropositive RA currently taking methotrexate 15mg  PO weekly. His symptoms started since about 3 months ago and followed thyroidectomy for thyroid cancer seen on FNA of a nodule. After the last visit he felt 10mg  prednisone was helpful for symptoms but since stopping this he is having more problems again. He has left wrist and hand swelling and much difficulty using this to play guitar as usual. His right hand is somewhat painful and has stiffness more in the 4th-5th  digits but better than left. He denies any problems taking the methotrexate so far.   Review of Systems  Constitutional:  Negative for fatigue.  HENT:  Positive for mouth dryness.   Eyes:  Positive for dryness.  Respiratory:  Negative for shortness of breath.   Cardiovascular:  Negative for swelling in legs/feet.  Gastrointestinal:  Negative for constipation.  Endocrine: Positive for increased urination.  Genitourinary:  Negative for difficulty urinating.  Musculoskeletal:  Positive for joint pain and joint pain.  Skin:  Positive for rash.  Allergic/Immunologic: Negative for susceptible to infections.  Neurological:  Negative for numbness.  Hematological:  Negative for bruising/bleeding tendency.  Psychiatric/Behavioral:  Positive for sleep disturbance.     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   Pain in right hand 10/12/2020   Pain of left hand 10/12/2020   Flexor tenosynovitis of finger 08/15/2020   Rheumatoid arthritis with rheumatoid factor of multiple sites without organ or systems involvement ([place]) 06/14/2020   High risk medication use 06/14/2020   Thyroid malignant neoplasm ([place]) 05/16/2020    Past Medical History:  Diagnosis Date   Cancer ([place])    Thyroid   Hyperlipidemia    Hypertension    Rheumatoid arthritis ([place])     Family History  Problem Relation Age of  Onset   Diabetes Mother    Hypertension Mother    Arthritis Mother    Diabetes Father    Hypertension Father    Diabetes Brother    Hypertension Brother    Hypertension Brother    Past Surgical History:  Procedure Laterality Date   COLONOSCOPY WITH PROPOFOL     COLONOSCOPY WITH PROPOFOL N/A 10/08/2021   Procedure: COLONOSCOPY WITH PROPOFOL;  Surgeon: Lucilla Lame, MD;  Location: [facility] ENDOSCOPY;  Service: Endoscopy;   Laterality: N/A;   MANDIBLE SURGERY     THYROIDECTOMY Bilateral 05/16/2020   Procedure: TOTAL THYROIDECTOMY;  Surgeon: Clyde Canterbury, MD;  Location: [facility] ORS;  Service: ENT;  Laterality: Bilateral;   Social History   Social History Narrative   Not on file   Immunization History  Administered Date(s) Administered   PFIZER(Purple Top)SARS-COV-2 Vaccination 08/25/2019, 09/20/2019     Objective: Vital Signs: BP (!) 143/80 (BP Location: Left Arm, Patient Position: Sitting, Cuff Size: Normal)   Pulse 71   Resp 15   Ht 5' 8.5" (1.74 m)   Wt 195 lb (88.5 kg)   BMI 29.22 kg/m    Physical Exam Cardiovascular:     Rate and Rhythm: Normal rate and regular rhythm.  Pulmonary:     Effort: Pulmonary effort is normal.     Breath sounds: Normal breath sounds.  Musculoskeletal:     Right lower leg: No edema.     Left lower leg: No edema.  Skin:    General: Skin is warm and dry.     Findings: No rash.  Neurological:     Mental Status: He is alert.  Psychiatric:        Mood and Affect: Mood normal.     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  Imaging: No results found.  Recent Labs: Lab Results  Component Value Date   WBC 8.4 02/04/2022   HGB 15.9 02/04/2022   PLT 274 02/04/2022   NA 142 02/04/2022   K 4.4 02/04/2022   CL 105 02/04/2022   CO2 30 02/04/2022   GLUCOSE 81 02/04/2022   BUN 16 02/04/2022   CREATININE 1.31 02/04/2022    BILITOT 0.7 02/04/2022   AST 12 02/04/2022   ALT 14 02/04/2022   PROT 6.5 02/04/2022   CALCIUM 9.9 02/04/2022   GFRAA 69 11/06/2020    Speciality Comments: No specialty comments available.  Procedures:  No procedures performed Allergies: Patient has no known allergies.   Assessment / Plan:     Visit Diagnoses: Rheumatoid arthritis with rheumatoid factor of multiple sites without organ or systems involvement ([place]) - Plan: C-reactive protein, DISCONTINUED: methotrexate (RHEUMATREX) 2.5 MG tablet, DISCONTINUED: folic acid (FOLVITE) 1 MG tablet, DISCONTINUED: predniSONE (DELTASONE) 5 MG tablet  Symptoms appear to be doing well today but he required a resumption of daily prednisone due to worsening of multiple joints on both sides in the upper extremities.  We will recheck the C-reactive protein for disease activity monitoring.  Recommend  he try tapering the prednisone down to 5 mg daily does not need to discontinue completely.  We will continue the methotrexate 12.5 mg p.o. weekly and folic acid 1 mg daily.  High risk medication use - Plan: CBC with Differential/Platelet, COMPLETE METABOLIC PANEL WITH GFR  Checking CBC and CMP for methotrexate medication monitoring.  Orders: Orders Placed This Encounter  Procedures   C-reactive protein   CBC with Differential/Platelet   COMPLETE METABOLIC PANEL WITH GFR    Meds ordered this encounter  Medications   DISCONTD: methotrexate (RHEUMATREX) 2.5 MG tablet    Sig: Take 5 tablets (12.5 mg total) by mouth once a week. Take 5 tablets by mouth once weekly. Caution:Chemotherapy. Protect from light.    Dispense:  65 tablet    Refill:  0   DISCONTD: folic acid (FOLVITE) 1 MG tablet    Sig: Take 1 tablet (1 mg total) by mouth daily.    Dispense:  90 tablet    Refill:  1   DISCONTD: predniSONE (DELTASONE) 5 MG tablet    Sig: Take 1 tablet (5 mg total) by mouth daily with breakfast.    Dispense:  30 tablet    Refill:  2      Follow-Up  Instructions: Return in about 3 months (around 07/31/2021) for RA on MTX/GC f/u 4mos.   Collier Salina, MD  Note - This record has been created using [employer].  Chart creation errors have been sought, but may not always  have been located. Such creation errors do not reflect on  the standard of medical care.

## 2021-05-01 LAB — CBC WITH DIFFERENTIAL/PLATELET
Absolute Monocytes: 783 cells/uL (ref 200–950)
Basophils Absolute: 72 cells/uL (ref 0–200)
Basophils Relative: 0.7 %
Eosinophils Absolute: 41 cells/uL (ref 15–500)
Eosinophils Relative: 0.4 %
HCT: 46.2 % (ref 38.5–50.0)
Hemoglobin: 15 g/dL (ref 13.2–17.1)
Lymphs Abs: 2215 cells/uL (ref 850–3900)
MCH: 31.8 pg (ref 27.0–33.0)
MCHC: 32.5 g/dL (ref 32.0–36.0)
MCV: 98.1 fL (ref 80.0–100.0)
MPV: 9.9 fL (ref 7.5–12.5)
Monocytes Relative: 7.6 %
Neutro Abs: 7189 cells/uL (ref 1500–7800)
Neutrophils Relative %: 69.8 %
Platelets: 287 10*3/uL (ref 140–400)
RBC: 4.71 10*6/uL (ref 4.20–5.80)
RDW: 13.9 % (ref 11.0–15.0)
Total Lymphocyte: 21.5 %
WBC: 10.3 10*3/uL (ref 3.8–10.8)

## 2021-05-01 LAB — COMPLETE METABOLIC PANEL WITH GFR
AG Ratio: 1.9 (calc) (ref 1.0–2.5)
ALT: 13 U/L (ref 9–46)
AST: 15 U/L (ref 10–35)
Albumin: 4.1 g/dL (ref 3.6–5.1)
Alkaline phosphatase (APISO): 58 U/L (ref 35–144)
BUN: 12 mg/dL (ref 7–25)
CO2: 34 mmol/L — ABNORMAL HIGH (ref 20–32)
Calcium: 9.6 mg/dL (ref 8.6–10.3)
Chloride: 104 mmol/L (ref 98–110)
Creat: 1.16 mg/dL (ref 0.70–1.35)
Globulin: 2.2 g/dL (calc) (ref 1.9–3.7)
Glucose, Bld: 102 mg/dL — ABNORMAL HIGH (ref 65–99)
Potassium: 4.1 mmol/L (ref 3.5–5.3)
Sodium: 144 mmol/L (ref 135–146)
Total Bilirubin: 0.7 mg/dL (ref 0.2–1.2)
Total Protein: 6.3 g/dL (ref 6.1–8.1)
eGFR: 69 mL/min/{1.73_m2} (ref >=60)

## 2021-05-01 LAB — C-REACTIVE PROTEIN: CRP: 0.4 mg/L (ref <8.0)

## 2021-05-01 NOTE — Progress Notes (Signed)
Lab results look fine to continue current methotrexate 12.5mg  weekly. His CRP is low, maybe from the prednisone as well though but should be okay to reduce this dose as planned.

## 2021-05-09 ENCOUNTER — Ambulatory Visit: Payer: Medicare Other | Admitting: Internal Medicine

## 2021-07-15 LAB — HM DEXA SCAN: HM Dexa Scan: NORMAL

## 2021-07-29 NOTE — Progress Notes (Signed)
Office Visit Note  Patient: Ralph Doyle             Date of Birth: Jul 23, 1950           MRN: [id number]             PCP: Lenard Simmer, MD Referring: Lenard Simmer, MD Visit Date: 07/30/2021   Subjective:  Medication Management (On Prednisone and Methotrexate needs refill of Methotrexate/ has questions about Folic Acid (explained briefly).)   History of Present Illness: Ralph Doyle is a 71 y.o. 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  Overall symptoms are doing okay he is having some increased stiffness affecting his neck and shoulders and feeling that hand contractures are more restrictive at fourth and fifth fingers on left hand.    Review of Systems  Constitutional:  Positive for weight gain.  HENT: Negative.    Eyes:  Positive for itching and dryness.  Respiratory:  Positive for cough.   Cardiovascular: Negative.   Gastrointestinal: Negative.   Endocrine: Negative.   Genitourinary: Negative.   Allergic/Immunologic:       Denies recent infections  Neurological: Negative.   Hematological: Negative.   Psychiatric/Behavioral:  Positive for depressed mood. Negative for suicidal ideas and self-injury. The patient is nervous/anxious.     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   Pain in right hand 10/12/2020   Pain of left hand 10/12/2020   Flexor tenosynovitis of finger 08/15/2020   Rheumatoid arthritis with rheumatoid factor of multiple sites without organ or systems involvement ([place]) 06/14/2020   High risk medication use 06/14/2020   Thyroid malignant neoplasm ([place]) 05/16/2020    Past Medical History:  Diagnosis Date   Cancer ([place])    Thyroid   Hyperlipidemia    Hypertension     Rheumatoid arthritis ([place])     Family History  Problem Relation Age of Onset   Diabetes Mother    Hypertension Mother    Arthritis Mother    Diabetes Father    Hypertension Father    Diabetes Brother    Hypertension Brother    Hypertension Brother    Past Surgical History:  Procedure Laterality Date   COLONOSCOPY WITH PROPOFOL     COLONOSCOPY WITH PROPOFOL N/A 10/08/2021   Procedure: COLONOSCOPY WITH PROPOFOL;  Surgeon: Lucilla Lame, MD;  Location: [facility] ENDOSCOPY;  Service: Endoscopy;  Laterality: N/A;   MANDIBLE SURGERY     THYROIDECTOMY Bilateral 05/16/2020   Procedure: TOTAL THYROIDECTOMY;  Surgeon: Clyde Canterbury, MD;  Location: [facility] ORS;  Service: ENT;  Laterality: Bilateral;   Social History   Social History Narrative   Not on file   Immunization History  Administered Date(s) Administered   PFIZER(Purple Top)SARS-COV-2 Vaccination 08/25/2019, 09/20/2019     Objective: Vital Signs: There were no vitals taken for this visit.   Physical Exam Cardiovascular:     Rate and Rhythm: Normal rate and regular rhythm.  Pulmonary:     Effort: Pulmonary effort is normal.     Breath sounds: Normal breath sounds.  Musculoskeletal:     Right lower leg: No edema.     Left lower leg: No edema.  Skin:    General: Skin  is warm and dry.  Neurological:     Mental Status: He is alert.  Psychiatric:        Mood and Affect: Mood normal.      Musculoskeletal Exam:  Shoulders full ROM, mild tenderness with neers and hawkins and tenderness to pressure laterally with no palpable swelling Elbows full ROM no tenderness or swelling Wrists full ROM no tenderness or swelling Fingers tenderness to pressure over right 2nd MCP without synovitis, fourth and fifth fingers with mild flexion contracture more severe on left hand no focal tenderness Hip normal internal and external rotation, mild lateral hip tenderness to pressure Knees full ROM no tenderness or swelling  CDAI Exam: CDAI Score:  9  Patient Global: 20 mm; Provider Global: 20 mm Swollen: 0 ; Tender: 5  Joint Exam 07/30/2021      Right  Left  Glenohumeral   Tender   Tender  MCP 2   Tender     MCP 4      Tender  MCP 5      Tender     Investigation: No additional findings.  Imaging: No results found.  Recent Labs: Lab Results  Component Value Date   WBC 8.4 02/04/2022   HGB 15.9 02/04/2022   PLT 274 02/04/2022   NA 142 02/04/2022   K 4.4 02/04/2022   CL 105 02/04/2022   CO2 30 02/04/2022   GLUCOSE 81 02/04/2022   BUN 16 02/04/2022   CREATININE 1.31 02/04/2022   BILITOT 0.7 02/04/2022   AST 12 02/04/2022   ALT 14 02/04/2022   PROT 6.5 02/04/2022   CALCIUM 9.9 02/04/2022   GFRAA 69 11/06/2020    Speciality Comments: No specialty comments available.  Procedures:  No procedures performed Allergies: Patient has no known allergies.   Assessment / Plan:     Visit Diagnoses: Rheumatoid arthritis with rheumatoid factor of multiple sites without organ or systems involvement ([place]) - Plan: C-reactive protein, folic acid (FOLVITE) 1 MG tablet, Turmeric 400 MG CAPS, DISCONTINUED: methotrexate (RHEUMATREX) 2.5 MG tablet, DISCONTINUED: predniSONE (DELTASONE) 5 MG tablet  Symptoms overall doing well but is having some possible breakthrough although no frank synovitis on examination.  Plan to continue methotrexate 12.5 mg p.o. weekly folic acid 1 mg daily.  Will refill prednisone 5 mg tablets recommend trying to tolerate 2.5 mg daily dose only if he can to minimize side effect risks.  Discussed can add a supplement such as turmeric with some data for benefit in reducing inflammation.  We will also check the C-reactive protein for disease activity monitoring today.  Flexor tenosynovitis of finger  Possible flexor tenosynovitis or just some irritation of existing flexor tendon contracture.  Not significantly a functional limitation at this time.  High risk medication use - Plan: CBC with Differential/Platelet,  COMPLETE METABOLIC PANEL WITH GFR  Taking CBC and CMP for methotrexate medication monitoring.  Orders: Orders Placed This Encounter  Procedures   CBC with Differential/Platelet   COMPLETE METABOLIC PANEL WITH GFR   C-reactive protein   Meds ordered this encounter  Medications   DISCONTD: methotrexate (RHEUMATREX) 2.5 MG tablet    Sig: Take 5 tablets (12.5 mg total) by mouth once a week. Take 5 tablets by mouth once weekly. Caution:Chemotherapy. Protect from light.    Dispense:  65 tablet    Refill:  0   DISCONTD: predniSONE (DELTASONE) 5 MG tablet    Sig: Take 0.5 tablets (2.5 mg total) by mouth daily with breakfast.    Dispense:  30 tablet    Refill:  1   folic acid (FOLVITE) 1 MG tablet    Sig: Take 1 tablet (1 mg total) by mouth daily.    Dispense:  90 tablet    Refill:  1   Turmeric 400 MG CAPS    Sig: Take as needed per directions     Follow-Up Instructions: Return in about 3 months (around 10/27/2021) for RA on MTX/GC f/u 66mos.   Collier Salina, MD  Note - This record has been created using [employer].  Chart creation errors have been sought, but may not always  have been located. Such creation errors do not reflect on  the standard of medical care.

## 2021-07-30 ENCOUNTER — Other Ambulatory Visit: Payer: Self-pay

## 2021-07-30 ENCOUNTER — Ambulatory Visit (INDEPENDENT_AMBULATORY_CARE_PROVIDER_SITE_OTHER): Payer: Medicare Other | Admitting: Internal Medicine

## 2021-07-30 ENCOUNTER — Encounter: Payer: Self-pay | Admitting: Internal Medicine

## 2021-07-30 DIAGNOSIS — M659 Synovitis and tenosynovitis, unspecified: Secondary | ICD-10-CM

## 2021-07-30 DIAGNOSIS — M0579 Rheumatoid arthritis with rheumatoid factor of multiple sites without organ or systems involvement: Secondary | ICD-10-CM | POA: Diagnosis not present

## 2021-07-30 DIAGNOSIS — Z79899 Other long term (current) drug therapy: Secondary | ICD-10-CM

## 2021-07-30 MED ORDER — METHOTREXATE 2.5 MG PO TABS: 12.5000 mg | ORAL_TABLET | ORAL | 0 refills | Status: DC

## 2021-07-30 MED ORDER — PREDNISONE 5 MG PO TABS: 2.5000 mg | ORAL_TABLET | Freq: Every day | ORAL | 1 refills | Status: DC

## 2021-07-30 MED ORDER — TURMERIC 400 MG PO CAPS: ORAL_CAPSULE | ORAL | Status: AC

## 2021-07-30 MED ORDER — FOLIC ACID 1 MG PO TABS: 1.0000 mg | ORAL_TABLET | Freq: Every day | ORAL | 1 refills | Status: DC

## 2021-07-30 NOTE — Patient Instructions (Signed)
Turmeric has some antiinflammatory effect similar to NSAID medications and may help with joint pain or swelling. If taken as a supplement should not be taken above recommended doses.

## 2021-07-31 LAB — COMPLETE METABOLIC PANEL WITH GFR
AG Ratio: 1.8 (calc) (ref 1.0–2.5)
ALT: 13 U/L (ref 9–46)
AST: 13 U/L (ref 10–35)
Albumin: 4 g/dL (ref 3.6–5.1)
Alkaline phosphatase (APISO): 57 U/L (ref 35–144)
BUN: 16 mg/dL (ref 7–25)
CO2: 32 mmol/L (ref 20–32)
Calcium: 9.5 mg/dL (ref 8.6–10.3)
Chloride: 104 mmol/L (ref 98–110)
Creat: 1.24 mg/dL (ref 0.70–1.35)
Globulin: 2.2 g/dL (calc) (ref 1.9–3.7)
Glucose, Bld: 75 mg/dL (ref 65–99)
Potassium: 4.3 mmol/L (ref 3.5–5.3)
Sodium: 143 mmol/L (ref 135–146)
Total Bilirubin: 0.5 mg/dL (ref 0.2–1.2)
Total Protein: 6.2 g/dL (ref 6.1–8.1)
eGFR: 63 mL/min/{1.73_m2} (ref >=60)

## 2021-07-31 LAB — CBC WITH DIFFERENTIAL/PLATELET
Absolute Monocytes: 979 cells/uL — ABNORMAL HIGH (ref 200–950)
Basophils Absolute: 57 cells/uL (ref 0–200)
Basophils Relative: 0.6 %
Eosinophils Absolute: 19 cells/uL (ref 15–500)
Eosinophils Relative: 0.2 %
HCT: 48.2 % (ref 38.5–50.0)
Hemoglobin: 16.1 g/dL (ref 13.2–17.1)
Lymphs Abs: 1055 cells/uL (ref 850–3900)
MCH: 32.1 pg (ref 27.0–33.0)
MCHC: 33.4 g/dL (ref 32.0–36.0)
MCV: 96 fL (ref 80.0–100.0)
MPV: 9.7 fL (ref 7.5–12.5)
Monocytes Relative: 10.3 %
Neutro Abs: 7391 cells/uL (ref 1500–7800)
Neutrophils Relative %: 77.8 %
Platelets: 258 10*3/uL (ref 140–400)
RBC: 5.02 10*6/uL (ref 4.20–5.80)
RDW: 14 % (ref 11.0–15.0)
Total Lymphocyte: 11.1 %
WBC: 9.5 10*3/uL (ref 3.8–10.8)

## 2021-07-31 LAB — C-REACTIVE PROTEIN: CRP: 2.9 mg/L (ref <8.0)

## 2021-07-31 NOTE — Progress Notes (Signed)
Labs look fine for continuing the methotrexate. CRP looks fine so suggests against a significant flare up. No changes from current plan needed.

## 2021-08-08 ENCOUNTER — Other Ambulatory Visit: Payer: Self-pay

## 2021-08-08 DIAGNOSIS — Z1211 Encounter for screening for malignant neoplasm of colon: Secondary | ICD-10-CM

## 2021-08-08 MED ORDER — PEG 3350-KCL-NA BICARB-NACL 420 G PO SOLR: 4000.0000 mL | Freq: Once | ORAL | 0 refills | Status: AC

## 2021-08-08 NOTE — Progress Notes (Signed)
Gastroenterology Pre-Procedure Review  Request Date: 09/02/2021 Requesting Physician: Dr. Marius Ditch  PATIENT REVIEW QUESTIONS: The patient responded to the following health history questions as indicated:    1. Are you having any GI issues? no 2. Do you have a personal history of Polyps? no 3. Do you have a family history of Colon Cancer or Polyps? no 4. Diabetes Mellitus? no 5. Joint replacements in the past 12 months?no 6. Major health problems in the past 3 months?no 7. Any artificial heart valves, MVP, or defibrillator?no    MEDICATIONS & ALLERGIES:    Patient reports the following regarding taking any anticoagulation/antiplatelet therapy:   Plavix, Coumadin, Eliquis, Xarelto, Lovenox, Pradaxa, Brilinta, or Effient? no Aspirin? no  Patient confirms/reports the following medications:  Current Outpatient Medications  Medication Sig Dispense Refill   amLODipine (NORVASC) 5 MG tablet Take 5 mg by mouth daily.     atorvastatin (LIPITOR) 10 MG tablet Take 10 mg by mouth daily.     augmented betamethasone dipropionate (DIPROLENE-AF) 0.05 % ointment Apply 1 application topically daily as needed for rash.     bacitracin ointment Apply 1 application topically every 8 (eight) hours. 638 g 0   folic acid (FOLVITE) 1 MG tablet Take 1 tablet (1 mg total) by mouth daily. 90 tablet 1   levothyroxine (SYNTHROID) 112 MCG tablet Take 112 mcg by mouth daily.     methotrexate (RHEUMATREX) 2.5 MG tablet Take 5 tablets (12.5 mg total) by mouth once a week. Take 5 tablets by mouth once weekly. Caution:Chemotherapy. Protect from light. 65 tablet 0   OVER THE COUNTER MEDICATION Apply 1 application topically daily as needed (pain). CBD cream     Polyethyl Glycol-Propyl Glycol (SYSTANE ULTRA OP) Place 1 drop into both eyes daily as needed (dry eyes).     predniSONE (DELTASONE) 5 MG tablet Take 0.5 tablets (2.5 mg total) by mouth daily with breakfast. 30 tablet 1   Turmeric 400 MG CAPS Take as needed per  directions     No current facility-administered medications for this visit.    Patient confirms/reports the following allergies:  No Known Allergies  No orders of the defined types were placed in this encounter.   AUTHORIZATION INFORMATION Primary Insurance: 1D#: Group #:  Secondary Insurance: 1D#: Group #:  SCHEDULE INFORMATION: Date: 09/02/2021 Time: Location:[facility]

## 2021-08-30 ENCOUNTER — Telehealth: Payer: Self-pay | Admitting: Gastroenterology

## 2021-08-30 ENCOUNTER — Telehealth: Payer: Self-pay

## 2021-08-30 NOTE — Telephone Encounter (Signed)
Patient requesting call from DR Vanga's nurse in reference to his colonoscopy on 09/02/2021. ?

## 2021-08-30 NOTE — Telephone Encounter (Signed)
Patient called he is not feeling good so rescheduled patient till 10/08/2021 called endo sent new refferal  to [facility] and sent new communications out ?

## 2021-10-08 ENCOUNTER — Ambulatory Visit: Payer: Medicare Other | Admitting: Anesthesiology

## 2021-10-08 ENCOUNTER — Ambulatory Visit
Admission: RE | Admit: 2021-10-08 | Discharge: 2021-10-08 | Disposition: A | Discharge status: Home / Self Care | Payer: Medicare Other | Attending: Gastroenterology | Admitting: Gastroenterology

## 2021-10-08 ENCOUNTER — Other Ambulatory Visit: Payer: Self-pay

## 2021-10-08 ENCOUNTER — Encounter: Payer: Self-pay | Admitting: Gastroenterology

## 2021-10-08 ENCOUNTER — Encounter
Admission: RE | Disposition: A | Discharge status: Home / Self Care | Payer: Self-pay | Source: Home / Self Care | Attending: Gastroenterology

## 2021-10-08 DIAGNOSIS — Z8261 Family history of arthritis: Secondary | ICD-10-CM | POA: Diagnosis not present

## 2021-10-08 DIAGNOSIS — M069 Rheumatoid arthritis, unspecified: Secondary | ICD-10-CM | POA: Diagnosis not present

## 2021-10-08 DIAGNOSIS — Z79899 Other long term (current) drug therapy: Secondary | ICD-10-CM | POA: Diagnosis not present

## 2021-10-08 DIAGNOSIS — K640 First degree hemorrhoids: Secondary | ICD-10-CM | POA: Diagnosis not present

## 2021-10-08 DIAGNOSIS — K635 Polyp of colon: Secondary | ICD-10-CM

## 2021-10-08 DIAGNOSIS — Z85850 Personal history of malignant neoplasm of thyroid: Secondary | ICD-10-CM | POA: Diagnosis not present

## 2021-10-08 DIAGNOSIS — Z1211 Encounter for screening for malignant neoplasm of colon: Secondary | ICD-10-CM | POA: Insufficient documentation

## 2021-10-08 DIAGNOSIS — D123 Benign neoplasm of transverse colon: Secondary | ICD-10-CM | POA: Diagnosis not present

## 2021-10-08 DIAGNOSIS — Z87891 Personal history of nicotine dependence: Secondary | ICD-10-CM | POA: Diagnosis not present

## 2021-10-08 DIAGNOSIS — I10 Essential (primary) hypertension: Secondary | ICD-10-CM | POA: Diagnosis not present

## 2021-10-08 DIAGNOSIS — R195 Other fecal abnormalities: Secondary | ICD-10-CM

## 2021-10-08 HISTORY — PX: COLONOSCOPY WITH PROPOFOL: SHX5780

## 2021-10-08 SURGERY — COLONOSCOPY WITH PROPOFOL
Anesthesia: General

## 2021-10-08 MED ORDER — LIDOCAINE HCL (CARDIAC) PF 100 MG/5ML IV SOSY
PREFILLED_SYRINGE | INTRAVENOUS | Status: DC | PRN
  Administered 2021-10-08: 100 mg via INTRAVENOUS

## 2021-10-08 MED ORDER — PROPOFOL 10 MG/ML IV BOLUS
INTRAVENOUS | Status: DC | PRN
  Administered 2021-10-08: 70 mg via INTRAVENOUS

## 2021-10-08 MED ORDER — SODIUM CHLORIDE 0.9 % IV SOLN: INTRAVENOUS | Status: DC

## 2021-10-08 MED ORDER — PROPOFOL 500 MG/50ML IV EMUL
INTRAVENOUS | Status: DC | PRN
  Administered 2021-10-08: 140 ug/kg/min via INTRAVENOUS

## 2021-10-08 NOTE — Op Note (Signed)
[facility] ?Gastroenterology ?Patient Name: Ralph Doyle ?Procedure Date: 10/08/2021 10:28 AM ?MRN: [id number] ?Account #: [id number] ?Date of Birth: 1951-05-02 ?Admit Type: Outpatient ?Age: 71 ?Room: [facility] ENDO ROOM 4 ?Gender: Male ?Note Status: Finalized ?Instrument Name: Colonoscope [id number] ?Procedure:             Colonoscopy ?Indications:           Positive Cologuard test ?Providers:             Lucilla Lame MD, MD ?Medicines:             Propofol per Anesthesia ?Complications:         No immediate complications. ?Procedure:             Pre-Anesthesia Assessment: ?                       - Prior to the procedure, a History and Physical was  ?                       performed, and patient medications and allergies were  ?                       reviewed. The patient's tolerance of previous  ?                       anesthesia was also reviewed. The risks and benefits  ?                       of the procedure and the sedation options and risks  ?                       were discussed with the patient. All questions were  ?                       answered, and informed consent was obtained. Prior  ?                       Anticoagulants: The patient has taken no previous  ?                       anticoagulant or antiplatelet agents. ASA Grade  ?                       Assessment: II - A patient with mild systemic disease.  ?                       After reviewing the risks and benefits, the patient  ?                       was deemed in satisfactory condition to undergo the  ?                       procedure. ?                       After obtaining informed consent, the colonoscope was  ?                       passed under direct vision. Throughout the procedure,  ?  the patient's blood pressure, pulse, and oxygen  ?                       saturations were monitored continuously. The  ?                       Colonoscope was introduced through the anus and  ?                        advanced to the the cecum, identified by appendiceal  ?                       orifice and ileocecal valve. The colonoscopy was  ?                       performed without difficulty. The patient tolerated  ?                       the procedure well. The quality of the bowel  ?                       preparation was excellent. ?Findings: ?     The perianal and digital rectal examinations were normal. ?     A 5 mm polyp was found in the transverse colon. The polyp was  ?     pedunculated. The polyp was removed with a cold snare. Resection and  ?     retrieval were complete. ?     Non-bleeding internal hemorrhoids were found during retroflexion. The  ?     hemorrhoids were Grade I (internal hemorrhoids that do not prolapse). ?Impression:            - One 5 mm polyp in the transverse colon, removed with  ?                       a cold snare. Resected and retrieved. ?                       - Non-bleeding internal hemorrhoids. ?Recommendation:        - Discharge patient to home. ?                       - Resume previous diet. ?                       - Continue present medications. ?                       - If the pathology report reveals adenomatous tissue,  ?                       then repeat the colonoscopy for surveillance in 7  ?                       years. ?Procedure Code(s):     --- Professional --- ?                       [id number], Colonoscopy, flexible; with removal of  ?  tumor(s), polyp(s), or other lesion(s) by snare  ?                       technique ?Diagnosis Code(s):     --- Professional --- ?                       R19.5, Other fecal abnormalities ?                       K63.5, Polyp of colon ?CPT copyright 2019 American Medical Association. All rights reserved. ?The codes documented in this report are preliminary and upon coder review may  ?be revised to meet current compliance requirements. ?Lucilla Lame MD, MD ?10/08/2021 10:54:24 AM ?This report has been signed electronically. ?Number of  Addenda: 0 ?Note Initiated On: 10/08/2021 10:28 AM ?Scope Withdrawal Time: 0 hours 6 minutes 39 seconds  ?Total Procedure Duration: 0 hours 10 minutes 57 seconds  ?Estimated Blood Loss:  Estimated blood loss: none. ?     [facility] ?

## 2021-10-08 NOTE — H&P (Signed)
? ?Lucilla Lame, MD [facility] ?[address] ?[place], [place] [address] ?[phone] ?Fax : [phone] ? ?Primary Care Physician:  Lenard Simmer, MD ?Primary Gastroenterologist:  Dr. Allen Norris ? ?Pre-Procedure History & Physical: ?HPI:  Ralph Doyle is a 71 y.o. male is here for an colonoscopy. ?  ?Past Medical History:  ?Diagnosis Date  ? Cancer [facility])   ? Thyroid  ? Hyperlipidemia   ? Hypertension   ? Rheumatoid arthritis ([place])   ? ? ?Past Surgical History:  ?Procedure Laterality Date  ? COLONOSCOPY WITH PROPOFOL    ? MANDIBLE SURGERY    ? THYROIDECTOMY Bilateral 05/16/2020  ? Procedure: TOTAL THYROIDECTOMY;  Surgeon: Clyde Canterbury, MD;  Location: [facility] ORS;  Service: ENT;  Laterality: Bilateral;  ? ? ?Prior to Admission medications   ?Medication Sig Start Date End Date Taking? Authorizing Provider  ?amLODipine (NORVASC) 5 MG tablet Take 5 mg by mouth daily.   Yes [provider]  ?levothyroxine (SYNTHROID) 112 MCG tablet Take 112 mcg by mouth daily. 04/15/21  Yes [provider]  ?Polyethyl Glycol-Propyl Glycol (SYSTANE ULTRA OP) Place 1 drop into both eyes daily as needed (dry eyes).   Yes [provider]  ?atorvastatin (LIPITOR) 10 MG tablet Take 10 mg by mouth daily.    [provider]  ?augmented betamethasone dipropionate (DIPROLENE-AF) 0.05 % ointment Apply 1 application topically daily as needed for rash. 02/09/20   [provider]  ?bacitracin ointment Apply 1 application topically every 8 (eight) hours. 05/17/20   Clyde Canterbury, MD  ?folic acid (FOLVITE) 1 MG tablet Take 1 tablet (1 mg total) by mouth daily. 07/30/21   Rice, Resa Miner, MD  ?methotrexate (RHEUMATREX) 2.5 MG tablet Take 5 tablets (12.5 mg total) by mouth once a week. Take 5 tablets by mouth once weekly. Caution:Chemotherapy. Protect from light. 07/30/21 10/29/21  Collier Salina, MD  ?OVER THE COUNTER MEDICATION Apply 1 application topically daily as needed (pain). CBD cream     [provider]  ?predniSONE (DELTASONE) 5 MG tablet Take 0.5 tablets (2.5 mg total) by mouth daily with breakfast. 07/30/21   Collier Salina, MD  ?Turmeric 400 MG CAPS Take as needed per directions 07/30/21   Collier Salina, MD  ? ? ?Allergies as of 08/08/2021  ? (No Known Allergies)  ? ? ?Family History  ?Problem Relation Age of Onset  ? Diabetes Mother   ? Hypertension Mother   ? Arthritis Mother   ? Diabetes Father   ? Hypertension Father   ? Diabetes Brother   ? Hypertension Brother   ? Hypertension Brother   ? ? ?Social History  ? ?Socioeconomic History  ? Marital status: Married  ?  Spouse name: Not on file  ? Number of children: Not on file  ? Years of education: Not on file  ? Highest education level: Not on file  ?Occupational History  ? Not on file  ?Tobacco Use  ? Smoking status: Former  ?  Packs/day: 0.75  ?  Years: 20.00  ?  Pack years: 15.00  ?  Types: Cigarettes  ?  Quit date: 67  ?  Years since quitting: 41.3  ? Smokeless tobacco: Never  ?Vaping Use  ? Vaping Use: Never used  ?Substance and Sexual Activity  ? Alcohol use: Yes  ?  Alcohol/week: 21.0 standard drinks  ?  Types: 21 Cans of beer per week  ?  Comment: NONE LAST 24HRS  ? Drug use: Yes  ?  Types: Marijuana  ? Sexual activity: Yes  ?Other Topics Concern  ? Not on file  ?Social History Narrative  ? Not on file  ? ?Social Determinants of Health  ? ?Financial Resource Strain: Not on file  ?Food Insecurity: Not on file  ?Transportation Needs: Not on file  ?Physical Activity: Not on file  ?Stress: Not on file  ?Social Connections: Not on file  ?Intimate Partner Violence: Not on file  ? ? ?Review of Systems: ?See HPI, otherwise negative ROS ? ?Physical Exam: ?BP (!) 151/93   Pulse 60   Temp (!) 96.4 ?F (35.8 ?C) (Temporal)   Resp 20   Ht '5\' 9"'$  (1.753 m)   Wt 86.2 kg   SpO2 100%   BMI 28.06 kg/m?  ?General:   Alert,  pleasant and cooperative in NAD ?Head:  Normocephalic and atraumatic. ?Neck:  Supple; no masses or  thyromegaly. ?Lungs:  Clear throughout to auscultation.    ?Heart:  Regular rate and rhythm. ?Abdomen:  Soft, nontender and nondistended. Normal bowel sounds, without guarding, and without rebound.   ?Neurologic:  Alert and  oriented x4;  grossly normal neurologically. ? ?Impression/Plan: ?Ralph Doyle is here for an colonoscopy to be performed for positive cologard ? ?Risks, benefits, limitations, and alternatives regarding  colonoscopy have been reviewed with the patient.  Questions have been answered.  All parties agreeable. ? ? ?Lucilla Lame, MD  10/08/2021, 10:09 AM ?

## 2021-10-08 NOTE — Anesthesia Preprocedure Evaluation (Addendum)
Anesthesia Evaluation  ?Patient identified by MRN, date of birth, ID band ?Patient awake ? ? ? ?Reviewed: ?Allergy & Precautions, NPO status , Patient's Chart, lab work & pertinent test results ? ?History of Anesthesia Complications ?Negative for: history of anesthetic complications ? ?Airway ?Mallampati: II ? ?TM Distance: >3 FB ?Neck ROM: Full ? ? ? Dental ?no notable dental hx. ? ?  ?Pulmonary ?neg pulmonary ROS, neg sleep apnea, neg COPD, former smoker,  ?  ?breath sounds clear to auscultation- rhonchi ?(-) wheezing ? ? ? ? ? Cardiovascular ?Exercise Tolerance: Good ?hypertension, Pt. on medications ?(-) CAD, (-) Past MI, (-) Cardiac Stents and (-) CABG  ?Rhythm:Regular Rate:Normal ?- Systolic murmurs and - Diastolic murmurs ? ?  ?Neuro/Psych ?neg Seizures negative neurological ROS ? negative psych ROS  ? GI/Hepatic ?negative GI ROS, (+)  ?  ? substance abuse ? marijuana use,   ?Endo/Other  ?neg diabetesHypothyroidism  ? Renal/GU ?negative Renal ROS  ? ?  ?Musculoskeletal ? ?(+) Arthritis , Rheumatoid disorders,   ? Abdominal ?(+) - obese,   ?Peds ? Hematology ?negative hematology ROS ?(+)   ?Anesthesia Other Findings ?Past Medical History: ?No date: Cancer [facility]) ?    Comment:  Thyroid ? ? Reproductive/Obstetrics ? ?  ? ? ? ? ? ? ? ? ? ? ? ? ? ?  ?  ? ? ? ? ? ? ? ? ?Anesthesia Physical ? ?Anesthesia Plan ? ?ASA: II ? ?Anesthesia Plan: General  ? ?Post-op Pain Management:   ? ?Induction: Intravenous ? ?PONV Risk Score and Plan: 1 and TIVA ? ?Airway Management Planned: Natural Airway ? ?Additional Equipment:  ? ?Intra-op Plan:  ? ?Post-operative Plan: Extubation in OR ? ?Informed Consent: I have reviewed the patients History and Physical, chart, labs and discussed the procedure including the risks, benefits and alternatives for the proposed anesthesia with the patient or authorized representative who has indicated his/her understanding and acceptance.  ? ? ? ?Dental advisory  given ? ?Plan Discussed with: CRNA and Anesthesiologist ? ?Anesthesia Plan Comments:   ? ? ? ? ? ?Anesthesia Quick Evaluation ? ?

## 2021-10-08 NOTE — Transfer of Care (Signed)
Immediate Anesthesia Transfer of Care Note ? ?Patient: Ralph Doyle ? ?Procedure(s) Performed: COLONOSCOPY WITH PROPOFOL ? ?Patient Location: PACU ? ?Anesthesia Type:General ? ?Level of Consciousness: awake, alert  and oriented ? ?Airway & Oxygen Therapy: Patient Spontanous Breathing ? ?Post-op Assessment: Report given to RN and Post -op Vital signs reviewed and stable ? ?Post vital signs: Reviewed and stable ? ?Last Vitals:  ?Vitals Value Taken Time  ?BP 100/67 10/08/21 1054  ?Temp    ?Pulse 59 10/08/21 1055  ?Resp 8 10/08/21 1055  ?SpO2 96 % 10/08/21 1055  ?Vitals shown include unvalidated device data. ? ?Last Pain:  ?Vitals:  ? 10/08/21 0950  ?TempSrc: Temporal  ?PainSc: 0-No pain  ?   ? ?  ? ?Complications: No notable events documented. ?

## 2021-10-08 NOTE — Anesthesia Postprocedure Evaluation (Signed)
Anesthesia Post Note ? ?Patient: Ralph Doyle ? ?Procedure(s) Performed: COLONOSCOPY WITH PROPOFOL ? ?Patient location during evaluation: Endoscopy ?Anesthesia Type: General ?Level of consciousness: awake and alert ?Pain management: pain level controlled ?Vital Signs Assessment: post-procedure vital signs reviewed and stable ?Respiratory status: spontaneous breathing, nonlabored ventilation and respiratory function stable ?Cardiovascular status: blood pressure returned to baseline and stable ?Postop Assessment: no apparent nausea or vomiting ?Anesthetic complications: no ? ? ?No notable events documented. ? ? ?Last Vitals:  ?Vitals:  ? 10/08/21 1110 10/08/21 1120  ?BP: (!) 116/102 (!) 141/99  ?Pulse: (!) 52 (!) 53  ?Resp: 13 18  ?Temp:    ?SpO2: 99% 100%  ?  ?Last Pain:  ?Vitals:  ? 10/08/21 1050  ?TempSrc: Temporal  ?PainSc:   ? ? ?  ?  ?  ?  ?  ?  ? ?[place] Ouch ? ? ? ? ?

## 2021-10-09 ENCOUNTER — Encounter: Payer: Self-pay | Admitting: Gastroenterology

## 2021-10-09 LAB — SURGICAL PATHOLOGY

## 2021-10-10 ENCOUNTER — Encounter: Payer: Self-pay | Admitting: Gastroenterology

## 2021-10-29 NOTE — Progress Notes (Signed)
? ?Office Visit Note ? ?Patient: Ralph Doyle             ?Date of Birth: 11/28/1950           ?MRN: [id number]             ?PCP: Lenard Simmer, MD ?Referring: Lenard Simmer, MD ?Visit Date: 10/30/2021 ? ? ?Subjective:  ?Rheumatoid Arthritis (Doing good) ? ? ?History of Present Illness: Ralph Doyle is a 71 y.o. 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  Overall symptoms are doing fairly well. He has some pain in is left middle finger hands otherwise about the same as before. He is having pain at the base of the neck more than usual, going for massages these temporarily help. Pain is somewhat worse with looking side to side. ? ? ?Review of Systems  ?Constitutional:  Negative for fatigue.  ?HENT:  Negative for mouth dryness.   ?Eyes:  Positive for dryness.  ?Respiratory:  Negative for shortness of breath.   ?Cardiovascular:  Negative for swelling in legs/feet.  ?Gastrointestinal:  Negative for constipation.  ?Endocrine: Positive for increased urination.  ?Genitourinary:  Negative for difficulty urinating.  ?Musculoskeletal:  Positive for joint pain, joint pain and morning stiffness.  ?Skin:  Positive for rash.  ?Allergic/Immunologic: Negative for susceptible to infections.  ?Neurological:  Negative for numbness.  ?Hematological:  Negative for bruising/bleeding tendency.  ?Psychiatric/Behavioral:  Positive for sleep disturbance.   ? ?PMFS History:  ?Patient Active Problem List  ? Diagnosis Date Noted  ? Neck pain 10/30/2021  ? Positive colorectal cancer screening using Cologuard test   ? Polyp of transverse colon   ? 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 Pain in right hand 10/12/2020  ? Pain of left hand 10/12/2020  ? Flexor tenosynovitis of finger 08/15/2020  ? Rheumatoid arthritis with rheumatoid factor of multiple sites without organ or systems involvement ([place]) 06/14/2020  ? High risk  medication use 06/14/2020  ? Thyroid malignant neoplasm ([place]) 05/16/2020  ?  ?Past Medical History:  ?Diagnosis Date  ? Cancer [facility])   ? Thyroid  ? Hyperlipidemia   ? Hypertension   ? Rheumatoid arthritis ([place])   ?  ?Family History  ?Problem Relation Age of Onset  ? Diabetes Mother   ? Hypertension Mother   ? Arthritis Mother   ? Diabetes Father   ? Hypertension Father   ? Diabetes Brother   ? Hypertension Brother   ? Hypertension Brother   ? ?Past Surgical History:  ?Procedure Laterality Date  ? COLONOSCOPY WITH PROPOFOL    ? COLONOSCOPY WITH PROPOFOL N/A 10/08/2021  ? Procedure: COLONOSCOPY WITH PROPOFOL;  Surgeon: Lucilla Lame, MD;  Location: [facility] ENDOSCOPY;  Service: Endoscopy;  Laterality: N/A;  ? MANDIBLE SURGERY    ? THYROIDECTOMY Bilateral 05/16/2020  ? Procedure: TOTAL THYROIDECTOMY;  Surgeon: Clyde Canterbury, MD;  Location: [facility] ORS;  Service: ENT;  Laterality: Bilateral;  ? ?Social History  ? ?Social History Narrative  ? Not on file  ? ?Immunization History  ?Administered Date(s) Administered  ? PFIZER(Purple Top)SARS-COV-2 Vaccination 08/25/2019, 09/20/2019  ?  ? ?Objective: ?Vital Signs: BP (!) 155/91 (BP Location: Left Arm, Patient Position: Sitting, Cuff Size: Normal)   Pulse 64   Resp 15   Ht 5' 8.5" (1.74 m)   Wt 196 lb (88.9 kg)   BMI 29.37 kg/m?   ? ?Physical Exam ?Cardiovascular:  ?  Rate and Rhythm: Normal rate and regular rhythm.  ?Pulmonary:  ?   Effort: Pulmonary effort is normal.  ?   Breath sounds: Normal breath sounds.  ?Skin: ?   General: Skin is warm and dry.  ?   Findings: No rash.  ?Neurological:  ?   Mental Status: He is alert.  ?Psychiatric:     ?   Mood and Affect: Mood normal.  ?  ? ?Musculoskeletal Exam:  ?Neck rightward lateral rotation mildly decreased compared to left, mild tenderness to pressure on the left side ?Shoulders full ROM no tenderness or swelling ?Elbows full ROM no tenderness or swelling ?Wrists full ROM no tenderness or swelling ?Fingers 4th-5th in flexion  contracture bilaterally, worse on left, left 3rd PIP mildly enlarged and tender no palpable synovitis ?Knees full ROM patellofemoral crepitus b/l ? ?CDAI Exam: ?CDAI Score: 6  ?Patient Global: 30 mm; Provider Global: 20 mm ?Swollen: 0 ; Tender: 1  ?Joint Exam 10/30/2021  ? ?   Right  Left  ?PIP 3      Tender  ? ? ? ?Investigation: ?No additional findings. ? ?Imaging: ?XR Cervical Spine 2 or 3 views ? ?Result Date: 10/30/2021 ?X-ray cervical spine 2 views Cervical spine appears grossly normal at level C1-C4.  There are anterior osteophyte formation at C5-C6 space without any significant height loss.  No significant spondylolisthesis seen.  Appears to be scoliosis of the top of the thoracic spine. Impression Mild degenerative arthritis changes more advanced at C5-C6 space otherwise normal cervical spine.  There appears to be either some lateral rotation or scoliosis visible in the upper thoracic spine  ? ?Recent Labs: ?Lab Results  ?Component Value Date  ? WBC 9.5 07/30/2021  ? HGB 16.1 07/30/2021  ? PLT 258 07/30/2021  ? NA 143 07/30/2021  ? K 4.3 07/30/2021  ? CL 104 07/30/2021  ? CO2 32 07/30/2021  ? GLUCOSE 75 07/30/2021  ? BUN 16 07/30/2021  ? CREATININE 1.24 07/30/2021  ? BILITOT 0.5 07/30/2021  ? AST 13 07/30/2021  ? ALT 13 07/30/2021  ? PROT 6.2 07/30/2021  ? CALCIUM 9.5 07/30/2021  ? GFRAA 69 11/06/2020  ? ? ?Speciality Comments: No specialty comments available. ? ?Procedures:  ?No procedures performed ?Allergies: Patient has no known allergies.  ? ?Assessment / Plan:     ?Visit Diagnoses: Rheumatoid arthritis with rheumatoid factor of multiple sites without organ or systems involvement ([place]) - Plan: XR Cervical Spine 2 or 3 views, Sedimentation rate, methotrexate (RHEUMATREX) 2.5 MG tablet ? ?Inflammatory arthritis looks well controlled today.  Checking sed rate for disease activity monitoring.  We will plan to continue methotrexate 12.5 mg p.o. weekly folic acid 1 mg daily in the turmeric supplement as  needed. ? ?High risk medication use - Plan: CBC with Differential/Platelet, COMPLETE METABOLIC PANEL WITH GFR ? ?Checking CBC and CMP for methotrexate toxicity monitoring. ? ?Neck pain - Plan: XR Cervical Spine 2 or 3 views ? ?Neck pain does not sound suggestive for inflammatory arthritis more localized to lower neck and paraspinal muscle area.  X-ray checked in clinic shows mild degenerative changes in the cervical spine.  I suspect problem is more related to postural and muscular issue may also be compensatory for left shoulder rotator cuff arthropathy.  Recommend referral to physical therapy for treatment probably to benefit with range of motion exercises he wants to avoid loss of ROM. ? ?Orders: ?Orders Placed This Encounter  ?Procedures  ? XR Cervical Spine 2 or 3 views  ?  CBC with Differential/Platelet  ? COMPLETE METABOLIC PANEL WITH GFR  ? Sedimentation rate  ? Ambulatory referral to Physical Therapy  ? ?Meds ordered this encounter  ?Medications  ? methotrexate (RHEUMATREX) 2.5 MG tablet  ?  Sig: Take 5 tablets (12.5 mg total) by mouth once a week. Take 5 tablets by mouth once weekly. Caution:Chemotherapy. Protect from light.  ?  Dispense:  65 tablet  ?  Refill:  0  ? ? ? ?Follow-Up Instructions: Return in about 3 months (around 01/30/2022) for RA on MTX f/u 33mo. ? ? ?CCollier Salina MD ? ?Note - This record has been created using [employer]  ?Chart creation errors have been sought, but may not always  ?have been located. Such creation errors do not reflect on  ?the standard of medical care. ? ?

## 2021-10-30 ENCOUNTER — Encounter: Payer: Self-pay | Admitting: Internal Medicine

## 2021-10-30 ENCOUNTER — Ambulatory Visit (INDEPENDENT_AMBULATORY_CARE_PROVIDER_SITE_OTHER): Payer: Medicare Other

## 2021-10-30 ENCOUNTER — Ambulatory Visit (INDEPENDENT_AMBULATORY_CARE_PROVIDER_SITE_OTHER): Payer: Medicare Other | Admitting: Internal Medicine

## 2021-10-30 VITALS — BP 155/91 | HR 64 | Resp 15 | Ht 68.5 in | Wt 196.0 lb

## 2021-10-30 DIAGNOSIS — Z79899 Other long term (current) drug therapy: Secondary | ICD-10-CM | POA: Diagnosis not present

## 2021-10-30 DIAGNOSIS — M0579 Rheumatoid arthritis with rheumatoid factor of multiple sites without organ or systems involvement: Secondary | ICD-10-CM | POA: Diagnosis not present

## 2021-10-30 DIAGNOSIS — M542 Cervicalgia: Secondary | ICD-10-CM

## 2021-10-30 MED ORDER — METHOTREXATE 2.5 MG PO TABS: 12.5000 mg | ORAL_TABLET | ORAL | 0 refills | Status: DC

## 2021-10-31 LAB — COMPLETE METABOLIC PANEL WITH GFR
AG Ratio: 1.7 (calc) (ref 1.0–2.5)
ALT: 13 U/L (ref 9–46)
AST: 15 U/L (ref 10–35)
Albumin: 4.3 g/dL (ref 3.6–5.1)
Alkaline phosphatase (APISO): 69 U/L (ref 35–144)
BUN: 13 mg/dL (ref 7–25)
CO2: 31 mmol/L (ref 20–32)
Calcium: 10 mg/dL (ref 8.6–10.3)
Chloride: 104 mmol/L (ref 98–110)
Creat: 1.18 mg/dL (ref 0.70–1.35)
Globulin: 2.5 g/dL (calc) (ref 1.9–3.7)
Glucose, Bld: 81 mg/dL (ref 65–99)
Potassium: 4.4 mmol/L (ref 3.5–5.3)
Sodium: 141 mmol/L (ref 135–146)
Total Bilirubin: 0.7 mg/dL (ref 0.2–1.2)
Total Protein: 6.8 g/dL (ref 6.1–8.1)
eGFR: 67 mL/min/{1.73_m2} (ref >=60)

## 2021-10-31 LAB — CBC WITH DIFFERENTIAL/PLATELET
Absolute Monocytes: 686 cells/uL (ref 200–950)
Basophils Absolute: 53 cells/uL (ref 0–200)
Basophils Relative: 0.6 %
Eosinophils Absolute: 70 cells/uL (ref 15–500)
Eosinophils Relative: 0.8 %
HCT: 49.1 % (ref 38.5–50.0)
Hemoglobin: 16.5 g/dL (ref 13.2–17.1)
Lymphs Abs: 1698 cells/uL (ref 850–3900)
MCH: 31.9 pg (ref 27.0–33.0)
MCHC: 33.6 g/dL (ref 32.0–36.0)
MCV: 95 fL (ref 80.0–100.0)
MPV: 9.9 fL (ref 7.5–12.5)
Monocytes Relative: 7.8 %
Neutro Abs: 6292 cells/uL (ref 1500–7800)
Neutrophils Relative %: 71.5 %
Platelets: 327 10*3/uL (ref 140–400)
RBC: 5.17 10*6/uL (ref 4.20–5.80)
RDW: 13.5 % (ref 11.0–15.0)
Total Lymphocyte: 19.3 %
WBC: 8.8 10*3/uL (ref 3.8–10.8)

## 2021-10-31 LAB — SEDIMENTATION RATE: Sed Rate: 2 mm/h (ref 0–20)

## 2021-10-31 NOTE — Progress Notes (Signed)
Lab results look good no problems with methotrexate and sed rate is normal okay to continue current methotrexate as planned.

## 2021-11-07 ENCOUNTER — Ambulatory Visit: Payer: Medicare Other | Attending: Internal Medicine

## 2021-11-07 DIAGNOSIS — M542 Cervicalgia: Secondary | ICD-10-CM | POA: Insufficient documentation

## 2021-11-07 DIAGNOSIS — M5412 Radiculopathy, cervical region: Secondary | ICD-10-CM | POA: Insufficient documentation

## 2021-11-07 NOTE — Therapy (Signed)
[facility] ?[address] ?[place], [place], [address] ?Phone: [phone]   Fax:  [phone] ? ?Physical Therapy Evaluation ? ?Patient Details  ?Name: Ralph Doyle ?MRN: [id number] ?Date of Birth: 02-21-1951 ?Referring Provider (PT): Collier Salina, MD ? ? ?Encounter Date: 11/07/2021 ? ? PT End of Session - 11/07/21 0934   ? ? Visit Number 1   ? Number of Visits 17   ? Date for PT Re-Evaluation 01/02/22   ? Authorization Type 1   ? Authorization Time Period 10 progress report.   ? PT Start Time [phone]   ? PT Stop Time 1018   ? PT Time Calculation (min) 44 min   ? Activity Tolerance Patient tolerated treatment well   ? Behavior During Therapy [facility] for tasks assessed/performed   ? ?  ?  ? ?  ? ? ?Past Medical History:  ?Diagnosis Date  ? Cancer [facility])   ? Thyroid  ? Hyperlipidemia   ? Hypertension   ? Rheumatoid arthritis ([place])   ? ? ?Past Surgical History:  ?Procedure Laterality Date  ? COLONOSCOPY WITH PROPOFOL    ? COLONOSCOPY WITH PROPOFOL N/A 10/08/2021  ? Procedure: COLONOSCOPY WITH PROPOFOL;  Surgeon: Lucilla Lame, MD;  Location: [facility] ENDOSCOPY;  Service: Endoscopy;  Laterality: N/A;  ? MANDIBLE SURGERY    ? THYROIDECTOMY Bilateral 05/16/2020  ? Procedure: TOTAL THYROIDECTOMY;  Surgeon: Clyde Canterbury, MD;  Location: [facility] ORS;  Service: ENT;  Laterality: Bilateral;  ? ? ?There were no vitals filed for this visit. ? ? ? Subjective Assessment - 11/07/21 0938   ? ? Subjective Neck pain: 4/10 currently, 7/10 neck pain at worst for the past 3 months.   ? Pertinent History Neck pain. turning his head bothers the back of his neck. Feels bilateral shoulder pain as well when he raises his arms up. Neck pain begain about a couple of years ago when pt got rheumatoid arthritis. Neck pain might have improved since onset. No unexplained chainges in weight. Pain does not wake him up at night. Thryroid was removed due to the cancer. No cancer anywhere else that  he knows of.   ? Patient Stated Goals Turn his head more comfortably.   ? Currently in Pain? Yes   ? Pain Score 4    ? Pain Location Neck   ? Pain Orientation Left;Right   ? Pain Descriptors / Indicators Aching   ? Pain Type Chronic pain   ? Pain Frequency Occasional   ? Aggravating Factors  turning his head R > L, turning his head while driving.   ? Pain Relieving Factors massage (temporary relief)   ? ?  ?  ? ?  ? ? ? ? ? [place] PT Assessment - 11/07/21 0936   ? ?  ? Assessment  ? Medical Diagnosis M54.2 (ICD-10-CM) - Neck pain   ? Referring Provider (PT) Collier Salina, MD   ? Onset Date/Surgical Date 10/30/21   Date PT referral signed.  ? Hand Dominance Right   ? Prior Therapy No known PT for current condition   ?  ? Precautions  ? Precaution Comments Rheumatoid arthritis   ?  ? Restrictions  ? Other Position/Activity Restrictions No known restrictions   ?  ? Balance Screen  ? Has the patient fallen in the past 6 months No   ? Has the patient had a decrease in activity level because of a fear of falling?  No   ?  Is the patient reluctant to leave their home because of a fear of falling?  No   ?  ? Observation/Other Assessments  ? Focus on Therapeutic Outcomes (FOTO)  Neck FOTO 63   ?  ? Posture/Postural Control  ? Posture Comments forward neck, R shoulder higher, movement crease at C5/6, slight R cervical lateral shift, R lateral shift.   ?  ? AROM  ? Cervical Flexion WFL   ? Cervical Extension WFL   ? Cervical - Right Side [facility] with R and L upper trap pain.   ? Cervical - Left Side [facility] with R and L upper trap pain.   ? Cervical - Right Rotation 60 degrees with pain   ? Cervical - Left Rotation 50 degrees with slight pain.   ?  ? Strength  ? Overall Strength Comments Manually resisted scapular retraction targeting lower trap: 4/5 R, 4-/5 L   ? Right Shoulder Flexion 4/5   ? Right Shoulder ABduction 4+/5   ? Right Shoulder Internal Rotation 5/5   ? Right Shoulder External Rotation 4-/5   ? Left  Shoulder Flexion 4/5   with pain  ? Left Shoulder ABduction 4/5   with pain  ? Left Shoulder Internal Rotation 5/5   ? Left Shoulder External Rotation 4-/5   ? Right Elbow Flexion 4-/5   ? Right Elbow Extension 5/5   ? Left Elbow Flexion 4/5   ? Left Elbow Extension 5/5   ? Right Wrist Extension 5/5   ? Left Wrist Extension 4+/5   ?  ? Palpation  ? Palpation comment Muscle tension R upper trap and L upper trap, R C5 rotation   ? ?  ?  ? ?  ? ? ? ? ? ? ? ? ? ? ? ? ? ?Objective measurements completed on examination: See above findings.  ? ?No latex allergies ?Blood pressure is controlled per pt.  ? ? ? ? ? ? ? ?Email: [email or web address] ? ?[place]  ? ?Rheumatoid arthritis.  ? ? ? ? ?Therapeutic exercise ? ?Seated B scapular retraction 10x5 seconds for 2 sets ? ?Seated cervical retraction/chin tuck 10x5 seconds for 2 sets ? ?Improved exercise technique, movement at target joints, use of target muscles after mod verbal, visual, tactile cues.  ? ? ? ? ? ?Access Code: [email or web address] ?URL: [email or web address] ?Date: 11/07/2021 ?Prepared by: Joneen Boers ? ? ? ?Exercises ?- Seated Scapular Retraction  - 1 x daily - 7 x weekly - 3 sets - 10 reps - 5 seconds hold ?- Seated Cervical Retraction  - 1 x daily - 7 x weekly - 3 sets - 10 reps - 5 seconds hold ? ? ? ?Response to treatment ?Pt tolerated session well without aggravation of symptoms. Neck feels pretty good after session per pt  ? ? ? ?Clinical impression ?Pt is a 71 year old male who came to physical therapy secondary to chronic neck pain. He also presents with altered posture, pain with cervical rotation and side bending bilaterally, B upper trap muscle tension, B scapular weakness, and difficulty looking around and driving secondary to pain. Pt will benefit from skilled physical therapy services to address the aforementioned deficits.  ? ? ? ? ? ? ? ? ? ? ? ? PT Education - 11/07/21 1431   ? ? Education  Details ther-ex, HEP, POC   ? Person(s) Educated Patient   ? Methods Explanation;Demonstration;Tactile cues;Verbal cues;Handout   ? Comprehension Verbalized  understanding;Returned demonstration   ? ?  ?  ? ?  ? ? ? PT Short Term Goals - 11/07/21 1056   ? ?  ? PT SHORT TERM GOAL #1  ? Title Pt will be independent with his initial HEP to decrease pain, improve strenth and ability to look around more comfortably.   ? Baseline Pt has started his HEP (11/07/2021)   ? Time 3   ? Period Weeks   ? Status New   ? Target Date 11/28/21   ? ?  ?  ? ?  ? ? ? ? PT Long Term Goals - 11/07/21 1057   ? ?  ? PT LONG TERM GOAL #1  ? Title Pt will have a decrease in neck pain to 2/10 or less at worst to promote ability to look around, drive more comfortably.   ? Baseline 7/10 neck pain at most for the past 3 months (11/07/2021)   ? Time 8   ? Period Weeks   ? Status New   ? Target Date 01/02/22   ?  ? PT LONG TERM GOAL #2  ? Title Pt will improve bilateral lower trap strength by at least 1/2  MMT to promote posture and ability to look around with less pain.   ? Baseline Manually resisted scapular retraction targeting lower trap: 4/5 R, 4-/5 L (11/07/2021)   ? Time 8   ? Period Weeks   ? Status New   ? Target Date 01/02/22   ?  ? PT LONG TERM GOAL #3  ? Title Pt will improve his neck FOTO score by at least 10 points as a demonstration of improved function.   ? Baseline Neck FOTO 63 (11/07/2021)   ? Time 8   ? Period Weeks   ? Status New   ? Target Date 01/02/22   ? ?  ?  ? ?  ? ? ? ? ? ? ? ? ? Plan - 11/07/21 1023   ? ? Clinical Impression Statement Pt is a 71 year old male who came to physical therapy secondary to chronic neck pain. He also presents with altered posture, pain with cervical rotation and side bending bilaterally, B upper trap muscle tension, B scapular weakness, and difficulty looking around and driving secondary to pain. Pt will benefit from skilled physical therapy services to address the aforementioned deficits.   ?  Personal Factors and Comorbidities Comorbidity 3+   ? Comorbidities HTN, rheumatoid arthritis, Hx of thyroid CA   ? Examination-Activity Limitations Other   looking around  ? Stability/Clinical Decision Making Stable/Un

## 2021-11-12 ENCOUNTER — Ambulatory Visit: Payer: Medicare Other

## 2021-11-12 DIAGNOSIS — M5412 Radiculopathy, cervical region: Secondary | ICD-10-CM

## 2021-11-12 DIAGNOSIS — M542 Cervicalgia: Secondary | ICD-10-CM

## 2021-11-12 NOTE — Therapy (Signed)
?OUTPATIENT PHYSICAL THERAPY TREATMENT NOTE ? ? ?Patient Name: Ralph Doyle ?MRN: [id number] ?DOB:Sep 12, 1950, 71 y.o., male ?Today's Date: 11/12/2021 ? ?PCP: Lenard Simmer, MD ?REFERRING PROVIDER: Collier Salina, MD ? ? PT End of Session - 11/12/21 1332   ? ? Visit Number 2   ? Number of Visits 17   ? Date for PT Re-Evaluation 01/02/22   ? Authorization Type 2   ? Authorization Time Period 10 progress report.   ? PT Start Time 1332   ? PT Stop Time 1412   ? PT Time Calculation (min) 40 min   ? Activity Tolerance Patient tolerated treatment well   ? Behavior During Therapy [facility] for tasks assessed/performed   ? ?  ?  ? ?  ? ? ?Past Medical History:  ?Diagnosis Date  ? Cancer [facility])   ? Thyroid  ? Hyperlipidemia   ? Hypertension   ? Rheumatoid arthritis ([place])   ? ?Past Surgical History:  ?Procedure Laterality Date  ? COLONOSCOPY WITH PROPOFOL    ? COLONOSCOPY WITH PROPOFOL N/A 10/08/2021  ? Procedure: COLONOSCOPY WITH PROPOFOL;  Surgeon: Lucilla Lame, MD;  Location: [facility] ENDOSCOPY;  Service: Endoscopy;  Laterality: N/A;  ? MANDIBLE SURGERY    ? THYROIDECTOMY Bilateral 05/16/2020  ? Procedure: TOTAL THYROIDECTOMY;  Surgeon: Clyde Canterbury, MD;  Location: [facility] ORS;  Service: ENT;  Laterality: Bilateral;  ? ?Patient Active Problem List  ? Diagnosis Date Noted  ? Neck pain 10/30/2021  ? Positive colorectal cancer screening using Cologuard test   ? Polyp of transverse colon   ? 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 Pain in right hand 10/12/2020  ? Pain of left hand 10/12/2020  ? Flexor tenosynovitis of finger 08/15/2020  ? Rheumatoid arthritis with rheumatoid factor of multiple sites without organ or systems involvement ([place]) 06/14/2020  ? High risk medication use 06/14/2020  ? Thyroid malignant neoplasm ([place]) 05/16/2020  ? ? ?REFERRING DIAG: M54.2 (ICD-10-CM) - Neck pain ? ?THERAPY DIAG:  ?Cervicalgia ? ?Radiculopathy, cervical region ? ?PERTINENT HISTORY: Neck pain. turning his head bothers the back of his  neck. Feels bilateral shoulder pain as well when he raises his arms up. Neck pain begain about a couple of years ago when pt got rheumatoid arthritis. Neck pain might have improved since onset. No unexplained chainges in weight. Pain does not wake him up at night. Thryroid was removed due to the cancer. No cancer anywhere else that he knows of. ? ?PRECAUTIONS: Rheumatoid arthritis ? ?SUBJECTIVE: Neck is ok, feel a little soreness when tilting his head side to side. Turning his head seems to be better. Tries to stay active.  ? ?PAIN:  ?Are you having pain? No ? ? ? ? ?TODAY'S TREATMENT:  ?Therapeutic exercise ?  ?Seated B scapular retraction 10x5 seconds ? Then with red band 10x5 seconds (easy) ? Then with blue band 10x5 seconds  ? ? Then with B shoulder extension with blue band 10x5 seconds ? ?  ?Seated cervical retraction/chin tuck 10x5 seconds for 2 sets ? ?Seated manually resisted scapular retraction targeting lower trap muscles ? R 10x5 seconds for 3 sets ? L 10x5 seconds for 3 sets ? ? ?  ?Improved exercise technique, movement at target joints, use of target muscles after mod verbal, visual, tactile cues.  ? ? ?Manual therapy   ?Seated STM B upper trap muscles to decrease muscle tension and fascial restrictions. .  ? ? ? ?Response to treatment ?Pt tolerated session well without aggravation of symptoms.  ?  ?  Clinical impression ?Worked on decreasing lower cervical extension, as well as B upper trap muscle tension, improving B lower trap muscle strength and upper thoracic extension to decrease stress to his neck and improve mechanics. Pt tolerated session well without aggravation of symptoms. Decreased neck pain with R and L cervical side bending after treatment to improve scapular strength and cervical retraction posture. Pt will benefit from continued skilled physical therapy services to decrease pain, improve strength and function.  ? ? ? ? ? ? ?PATIENT EDUCATION: ?Education details: ther-ex ?Person educated:  Patient ?Education method: Explanation, Demonstration, Tactile cues, and Verbal cues ?Education comprehension: verbalized understanding and returned demonstration ? ? ?HOME EXERCISE PROGRAM: ?Access Code: [email or web address] ?URL: [email or web address] ?Date: 11/07/2021 ?Prepared by: Joneen Boers ?  ?  ?  ?Exercises ?- Seated Scapular Retraction  - 1 x daily - 7 x weekly - 3 sets - 10 reps - 5 seconds hold ?- Seated Cervical Retraction  - 1 x daily - 7 x weekly - 3 sets - 10 reps - 5 seconds hold ?  ? ? PT Short Term Goals - 11/07/21 1056   ? ?  ? PT SHORT TERM GOAL #1  ? Title Pt will be independent with his initial HEP to decrease pain, improve strenth and ability to look around more comfortably.   ? Baseline Pt has started his HEP (11/07/2021)   ? Time 3   ? Period Weeks   ? Status New   ? Target Date 11/28/21   ? ?  ?  ? ?  ? ? ? PT Long Term Goals - 11/07/21 1057   ? ?  ? PT LONG TERM GOAL #1  ? Title Pt will have a decrease in neck pain to 2/10 or less at worst to promote ability to look around, drive more comfortably.   ? Baseline 7/10 neck pain at most for the past 3 months (11/07/2021)   ? Time 8   ? Period Weeks   ? Status New   ? Target Date 01/02/22   ?  ? PT LONG TERM GOAL #2  ? Title Pt will improve bilateral lower trap strength by at least 1/2  MMT to promote posture and ability to look around with less pain.   ? Baseline Manually resisted scapular retraction targeting lower trap: 4/5 R, 4-/5 L (11/07/2021)   ? Time 8   ? Period Weeks   ? Status New   ? Target Date 01/02/22   ?  ? PT LONG TERM GOAL #3  ? Title Pt will improve his neck FOTO score by at least 10 points as a demonstration of improved function.   ? Baseline Neck FOTO 63 (11/07/2021)   ? Time 8   ? Period Weeks   ? Status New   ? Target Date 01/02/22   ? ?  ?  ? ?  ? ? ? Plan - 11/12/21 1332   ? ? Clinical Impression Statement Worked on decreasing lower cervical extension, as well as B upper trap muscle tension, improving B lower trap  muscle strength and upper thoracic extension to decrease stress to his neck and improve mechanics. Pt tolerated session well without aggravation of symptoms. Decreased neck pain with R and L cervical side bending after treatment to improve scapular strength and cervical retraction posture. Pt will benefit from continued skilled physical therapy services to decrease pain, improve strength and function.   ? Personal Factors and Comorbidities Comorbidity 3+   ? Comorbidities HTN, rheumatoid  arthritis, Hx of thyroid CA   ? Examination-Activity Limitations Other   looking around  ? Stability/Clinical Decision Making Stable/Uncomplicated   ? Rehab Potential Fair   ? PT Frequency 2x / week   ? PT Duration 8 weeks   ? PT Treatment/Interventions Therapeutic exercise;Therapeutic activities;Neuromuscular re-education;Patient/family education;Manual techniques;Dry needling;Electrical Stimulation   ? PT Next Visit Plan scapular strengthening, posture, mechanics, manual techniques, modalities PRN   ? PT Home Exercise Plan Access Code: [email or web address]   ? Consulted and Agree with Plan of Care Patient   ? ?  ?  ? ?  ? ? ? ?Joneen Boers PT, DPT ? ?11/12/2021, 4:53 PM ? ?  ? ?

## 2021-11-14 ENCOUNTER — Ambulatory Visit: Payer: Medicare Other

## 2021-11-14 DIAGNOSIS — M542 Cervicalgia: Secondary | ICD-10-CM

## 2021-11-14 DIAGNOSIS — M5412 Radiculopathy, cervical region: Secondary | ICD-10-CM

## 2021-11-14 NOTE — Therapy (Signed)
OUTPATIENT PHYSICAL THERAPY TREATMENT NOTE   Patient Name: Ralph Doyle MRN: [id number] DOB:[birth year], 71 y.o., male Today's Date: 11/14/2021  PCP: Lenard Simmer, MD REFERRING PROVIDER: Collier Salina, MD   PT End of Session - 11/14/21 0935     Visit Number 3    Number of Visits 17    Date for PT Re-Evaluation 01/02/22    Authorization Type 3    Authorization Time Period 10 progress report.    PT Start Time 0935    PT Stop Time 1017    PT Time Calculation (min) 42 min    Activity Tolerance Patient tolerated treatment well    Behavior During Therapy WFL for tasks assessed/performed              Past Medical History:  Diagnosis Date   Cancer ([place])    Thyroid   Hyperlipidemia    Hypertension    Rheumatoid arthritis ([place])    Past Surgical History:  Procedure Laterality Date   COLONOSCOPY WITH PROPOFOL     COLONOSCOPY WITH PROPOFOL N/A 10/08/2021   Procedure: COLONOSCOPY WITH PROPOFOL;  Surgeon: Lucilla Lame, MD;  Location: [facility];  Service: Endoscopy;  Laterality: N/A;   MANDIBLE SURGERY     THYROIDECTOMY Bilateral 05/16/2020   Procedure: TOTAL THYROIDECTOMY;  Surgeon: Clyde Canterbury, MD;  Location: [facility];  Service: ENT;  Laterality: Bilateral;   Patient Active Problem List   Diagnosis Date Noted   Neck pain 10/30/2021   Positive colorectal cancer screening using Cologuard test    Polyp of transverse colon    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 turning his head bothers the back of his  neck. Feels bilateral shoulder pain as well when he raises his arms up. Neck pain begain about a couple of years ago when pt got rheumatoid arthritis. Neck pain might have improved since onset. No unexplained chainges in weight. Pain does not wake him up at night. Thryroid was removed due to the cancer. No cancer anywhere else that he knows of.  PRECAUTIONS: Rheumatoid arthritis  SUBJECTIVE: Neck is no too bad. Still has discomfort     PAIN:  Are you having pain? 5-6/10 with R and L cervical side bending.      TODAY'S TREATMENT:  Therapeutic exercise   Supine B scapular retraction with chin tucks 10x3 with 5 second holds   Supine cervical nod with R and L cervical rotation 10x3 with 5 second holds  Supine B open books (horizontal shoulder abduction) to promote upper thoracic extension to decrease extension stress to neck.   Seated first rib stretch  R 15 seconds x 5  L 15 seconds x 5   Seated manually resisted scapular retraction targeting lower trap muscles  R 10x5 seconds for 2 sets    Improved exercise technique, movement at target joints, use of target muscles after mod verbal, visual, tactile cues.    Manual therapy  Seated STM B upper trap/rhomboid muscles to decrease muscle tension and fascial restrictions. .  Decreased neck pain with R cervical rotation after STM  to decrease L rhomboid muscle tension to decrease R C6 rotation    Response to treatment Pt tolerated session well without aggravation of symptoms.    Clinical impression Decreased R posterior lateral neck pain with treatment to decrease L rhomboid muscle pull on C6 spinous process to decrease R C6 rotation position. Pt will benefit from continued skilled physical therapy services to decrease pain, improve strength and function.        PATIENT EDUCATION: Education details: ther-ex Person educated: Patient Education method: Explanation, Demonstration, Corporate treasurer cues, and Verbal cues Education  comprehension: verbalized understanding and returned demonstration   HOME EXERCISE PROGRAM: Access Code: [email or web address] URL: [email or web address] Date: 11/07/2021 Prepared by: Joneen Boers       Exercises - Seated Scapular Retraction  - 1 x daily - 7 x weekly - 3 sets - 10 reps - 5 seconds hold - Seated Cervical Retraction  - 1 x daily - 7 x weekly - 3 sets - 10 reps - 5 seconds hold     PT Short Term Goals - 11/07/21 1056       PT SHORT TERM GOAL #1   Title Pt will be independent with his initial HEP to decrease pain, improve strenth and ability to look around more comfortably.    Baseline Pt has started his HEP (11/07/2021)    Time 3    Period Weeks    Status New    Target Date 11/28/21              PT Long Term Goals - 11/07/21 1057       PT LONG TERM GOAL #1   Title Pt will have a decrease in neck pain to 2/10 or less at worst to promote ability to look around, drive more comfortably.    Baseline 7/10 neck pain at most for the past 3 months (11/07/2021)    Time 8    Period Weeks    Status New    Target Date 01/02/22      PT LONG TERM GOAL #2   Title Pt will improve bilateral lower trap strength by at least 1/2  MMT to promote posture and ability to look around with less pain.    Baseline Manually resisted scapular retraction targeting lower trap: 4/5 R, 4-/5 L (11/07/2021)    Time 8    Period Weeks    Status New    Target Date 01/02/22      PT LONG TERM GOAL #3   Title Pt will improve his neck FOTO score by at least 10 points as a demonstration of improved function.    Baseline Neck FOTO 63 (11/07/2021)    Time 8    Period Weeks    Status New    Target Date 01/02/22              Plan - 11/14/21 0934     Clinical Impression Statement Decreased R posterior lateral neck pain with treatment to decrease L rhomboid muscle pull on C6 spinous process to decrease R C6 rotation position. Pt will benefit from continued skilled physical therapy  services to decrease pain, improve strength and function.    Personal Factors and Comorbidities Comorbidity 3+    Comorbidities HTN, rheumatoid arthritis, Hx of thyroid CA    Examination-Activity Limitations Other   looking around   Stability/Clinical Decision Making Stable/Uncomplicated    Rehab Potential Fair    PT Frequency 2x / week    PT Duration 8 weeks    PT  Treatment/Interventions Therapeutic exercise;Therapeutic activities;Neuromuscular re-education;Patient/family education;Manual techniques;Dry needling;Electrical Stimulation    PT Next Visit Plan scapular strengthening, posture, mechanics, manual techniques, modalities PRN    PT Home Exercise Plan Access Code: [email or web address]    Consulted and Agree with Plan of Care Patient               Joneen Boers PT, DPT  11/14/2021, 1:14 PM

## 2021-11-19 ENCOUNTER — Ambulatory Visit: Payer: Medicare Other

## 2021-11-19 DIAGNOSIS — M542 Cervicalgia: Secondary | ICD-10-CM | POA: Diagnosis not present

## 2021-11-19 DIAGNOSIS — M5412 Radiculopathy, cervical region: Secondary | ICD-10-CM

## 2021-11-19 NOTE — Therapy (Signed)
OUTPATIENT PHYSICAL THERAPY TREATMENT NOTE   Patient Name: Ralph Doyle MRN: [id number] DOB:August 14, 1950, 71 y.o., male Today's Date: 11/19/2021  PCP: Lenard Simmer, MD REFERRING PROVIDER: Collier Salina, MD   PT End of Session - 11/19/21 0933     Visit Number 4    Number of Visits 17    Date for PT Re-Evaluation 01/02/22    Authorization Type 4    Authorization Time Period 10 progress report.    PT Start Time [phone]    PT Stop Time 1018    PT Time Calculation (min) 45 min    Activity Tolerance Patient tolerated treatment well    Behavior During Therapy WFL for tasks assessed/performed               Past Medical History:  Diagnosis Date   Cancer ([place])    Thyroid   Hyperlipidemia    Hypertension    Rheumatoid arthritis ([place])    Past Surgical History:  Procedure Laterality Date   COLONOSCOPY WITH PROPOFOL     COLONOSCOPY WITH PROPOFOL N/A 10/08/2021   Procedure: COLONOSCOPY WITH PROPOFOL;  Surgeon: Lucilla Lame, MD;  Location: [facility];  Service: Endoscopy;  Laterality: N/A;   MANDIBLE SURGERY     THYROIDECTOMY Bilateral 05/16/2020   Procedure: TOTAL THYROIDECTOMY;  Surgeon: Clyde Canterbury, MD;  Location: [facility];  Service: ENT;  Laterality: Bilateral;   Patient Active Problem List   Diagnosis Date Noted   Neck pain 10/30/2021   Positive colorectal cancer screening using Cologuard test    Polyp of transverse colon    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 turning his head bothers the back of his  neck. Feels bilateral shoulder pain as well when he raises his arms up. Neck pain begain about a couple of years ago when pt got rheumatoid arthritis. Neck pain might have improved since onset. No unexplained chainges in weight. Pain does not wake him up at night. Thryroid was removed due to the cancer. No cancer anywhere else that he knows of.  PRECAUTIONS: Rheumatoid arthritis  SUBJECTIVE: Neck pain is R posterior lateral side when turning his head R and L     PAIN:  Are you having pain? 4-5/10 with R and L rotation. 5-6/10 R and L cervical side bending.      TODAY'S TREATMENT:  Therapeutic exercise   Supine PROM R and L cervical rotation. Did not hurt as bad.    R and L cervical side bend. No pain.   Supine eccentric R and L cervical rotation 10x2 each direction   Slight decreased R posterior lateral neck pain with R and L rotation.   Supine with cervical occipital float  R and L cervical rotation with chin tuck and B scapular retraction 10x3  Standing R scapular retraction and shoulder extension  green band 10x2  Standing B scapular retraction with shoulder extension green band 10x  Standign B scapular retraction with chin tuck 10x5 seconds       Improved exercise technique, movement at target joints, use of target muscles after  mod verbal, visual, tactile cues.      Response to treatment Pt tolerated session well without aggravation of symptoms.    Clinical impression Decreased neck pain with cervical rotation in gravity eliminated position and when performed passively suggesting need for improving anterior cervical and intrinsic cervical muscle strength to promote better mechanics of movement. Pt tolerated session well without aggravation of symptoms. Pt will benefit from continued skilled physical therapy services to decrease pain, improve strength and function.        PATIENT EDUCATION: Education details: ther-ex Person educated: Patient Education  method: Explanation, Demonstration, Corporate treasurer cues, and Verbal cues Education comprehension: verbalized understanding and returned demonstration   HOME EXERCISE PROGRAM: Access Code: [email or web address] URL: [email or web address] Date: 11/07/2021 Prepared by: Joneen Boers       Exercises - Seated Scapular Retraction  - 1 x daily - 7 x weekly - 3 sets - 10 reps - 5 seconds hold - Seated Cervical Retraction  - 1 x daily - 7 x weekly - 3 sets - 10 reps - 5 seconds hold  - Shoulder Extension with Resistance  - 3 x daily - 7 x weekly - 1 sets - 10 reps   PT Short Term Goals - 11/07/21 1056       PT SHORT TERM GOAL #1   Title Pt will be independent with his initial HEP to decrease pain, improve strenth and ability to look around more comfortably.    Baseline Pt has started his HEP (11/07/2021)    Time 3    Period Weeks    Status New    Target Date 11/28/21              PT Long Term Goals - 11/07/21 1057       PT LONG TERM GOAL #1   Title Pt will have a decrease in neck pain to 2/10 or less at worst to promote ability to look around, drive more comfortably.    Baseline 7/10 neck pain at most for the past 3 months (11/07/2021)    Time 8    Period Weeks    Status New    Target Date 01/02/22      PT LONG TERM GOAL #2   Title Pt will improve bilateral lower trap strength by at least 1/2  MMT to promote posture and ability to look around with less pain.    Baseline Manually resisted scapular retraction targeting lower trap: 4/5 R, 4-/5 L (11/07/2021)    Time 8    Period Weeks    Status New    Target Date 01/02/22      PT LONG TERM GOAL #3   Title Pt will improve his neck FOTO score by at least 10 points as a demonstration of improved function.    Baseline Neck FOTO 63 (11/07/2021)    Time 8    Period Weeks    Status New    Target Date 01/02/22              Plan - 11/19/21 1303     Clinical Impression Statement Decreased neck pain with cervical rotation in gravity  eliminated position and when performed passively suggesting need for improving anterior cervical and intrinsic cervical muscle strength to promote better mechanics of movement. Pt tolerated session well without aggravation of symptoms. Pt will benefit from continued skilled physical therapy services to decrease pain, improve strength and function.    Personal Factors and Comorbidities Comorbidity 3+    Comorbidities HTN, rheumatoid arthritis,  Hx of thyroid CA    Examination-Activity Limitations Other   looking around   Stability/Clinical Decision Making Stable/Uncomplicated    Rehab Potential Fair    PT Frequency 2x / week    PT Duration 8 weeks    PT Treatment/Interventions Therapeutic exercise;Therapeutic activities;Neuromuscular re-education;Patient/family education;Manual techniques;Dry needling;Electrical Stimulation    PT Next Visit Plan scapular strengthening, posture, mechanics, manual techniques, modalities PRN    PT Home Exercise Plan Access Code: [email or web address]    Consulted and Agree with Plan of Care Patient                Joneen Boers PT, DPT  11/19/2021, 1:09 PM

## 2021-11-21 ENCOUNTER — Ambulatory Visit: Payer: Medicare Other

## 2021-11-21 DIAGNOSIS — M542 Cervicalgia: Secondary | ICD-10-CM | POA: Diagnosis not present

## 2021-11-21 DIAGNOSIS — M5412 Radiculopathy, cervical region: Secondary | ICD-10-CM

## 2021-11-21 NOTE — Therapy (Signed)
OUTPATIENT PHYSICAL THERAPY TREATMENT NOTE   Patient Name: Ralph Doyle MRN: [id number] DOB:06/10/1951, 71 y.o., male Today's Date: 11/21/2021  PCP: Lenard Simmer, MD REFERRING PROVIDER: Collier Salina, MD   PT End of Session - 11/21/21 1547     Visit Number 5    Number of Visits 17    Date for PT Re-Evaluation 01/02/22    Authorization Type 5    Authorization Time Period 10 progress report.    PT Start Time 1547    PT Stop Time 1629    PT Time Calculation (min) 42 min    Activity Tolerance Patient tolerated treatment well    Behavior During Therapy WFL for tasks assessed/performed                Past Medical History:  Diagnosis Date   Cancer ([place])    Thyroid   Hyperlipidemia    Hypertension    Rheumatoid arthritis ([place])    Past Surgical History:  Procedure Laterality Date   COLONOSCOPY WITH PROPOFOL     COLONOSCOPY WITH PROPOFOL N/A 10/08/2021   Procedure: COLONOSCOPY WITH PROPOFOL;  Surgeon: Lucilla Lame, MD;  Location: [facility];  Service: Endoscopy;  Laterality: N/A;   MANDIBLE SURGERY     THYROIDECTOMY Bilateral 05/16/2020   Procedure: TOTAL THYROIDECTOMY;  Surgeon: Clyde Canterbury, MD;  Location: [facility];  Service: ENT;  Laterality: Bilateral;   Patient Active Problem List   Diagnosis Date Noted   Neck pain 10/30/2021   Positive colorectal cancer screening using Cologuard test    Polyp of transverse colon    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 turning his head bothers the back of  his neck. Feels bilateral shoulder pain as well when he raises his arms up. Neck pain begain about a couple of years ago when pt got rheumatoid arthritis. Neck pain might have improved since onset. No unexplained chainges in weight. Pain does not wake him up at night. Thryroid was removed due to the cancer. No cancer anywhere else that he knows of.  PRECAUTIONS: Rheumatoid arthritis  SUBJECTIVE: Neck pain is doing pretty good. Feels L posterior neck pain with R cervical rotation.        PAIN:  Are you having pain? 3-4/10 with R. 4-5/10 R and L cervical side bending.      TODAY'S TREATMENT:  Therapeutic exercise   Seated chin tucks 10x5 seconds for 3 sets  Seated manually resisted cervical flexion isometrics in neutral 10x3 with 5 second holds   Seated chin tuck with cervical rotation 10x3 each direction   Seated scapular retraction targeting lower trap muscle   R 10x3 with 5 second holds     Improved exercise technique, movement at target joints, use of target muscles after mod verbal, visual, tactile cues.    Manual therapy Seated STM B cervical paraspinal and upper trap muscles L > R to decrease fascial restrictions and muscle tension.  Decreased neck pain with R and L cervical rotation        Response to treatment Pt tolerated session well  without aggravation of symptoms. Decreased neck pain with  R andk L cervical rotation after session      Clinical impression Decreased neck pain with treatment to decrease B upper trap muscle tension L > R as well as improving mechanics (decreasing cervical protraction while turning his head).  Pt tolerated session well without aggravation of symptoms. Pt will benefit from continued skilled physical therapy services to decrease pain, improve strength and function.        PATIENT EDUCATION: Education details: ther-ex Person educated: Patient Education method: Explanation, Demonstration, Corporate treasurer cues, and Verbal  cues Education comprehension: verbalized understanding and returned demonstration   HOME EXERCISE PROGRAM: Access Code: [email or web address] URL: [email or web address] Date: 11/07/2021 Prepared by: Joneen Boers       Exercises - Seated Scapular Retraction  - 1 x daily - 7 x weekly - 3 sets - 10 reps - 5 seconds hold - Seated Cervical Retraction  - 1 x daily - 7 x weekly - 3 sets - 10 reps - 5 seconds hold  - Shoulder Extension with Resistance  - 3 x daily - 7 x weekly - 1 sets - 10 reps   PT Short Term Goals - 11/07/21 1056       PT SHORT TERM GOAL #1   Title Pt will be independent with his initial HEP to decrease pain, improve strenth and ability to look around more comfortably.    Baseline Pt has started his HEP (11/07/2021)    Time 3    Period Weeks    Status New    Target Date 11/28/21              PT Long Term Goals - 11/07/21 1057       PT LONG TERM GOAL #1   Title Pt will have a decrease in neck pain to 2/10 or less at worst to promote ability to look around, drive more comfortably.    Baseline 7/10 neck pain at most for the past 3 months (11/07/2021)    Time 8    Period Weeks    Status New    Target Date 01/02/22      PT LONG TERM GOAL #2   Title Pt will improve bilateral lower trap strength by at least 1/2  MMT to promote posture and ability to look around with less pain.    Baseline Manually resisted scapular retraction targeting lower trap: 4/5 R, 4-/5 L (11/07/2021)    Time 8    Period Weeks    Status New    Target Date 01/02/22      PT LONG TERM GOAL #3   Title Pt will improve his neck FOTO score by at least 10 points as a demonstration of improved function.    Baseline Neck FOTO 63 (11/07/2021)    Time 8    Period Weeks    Status New    Target Date 01/02/22              Plan - 11/21/21 1547     Clinical Impression Statement Decreased neck pain with treatment to decrease B upper trap muscle tension L > R as well as improving mechanics  (decreasing cervical protraction while turning his head).  Pt tolerated session well without aggravation of symptoms. Pt will benefit from continued skilled physical therapy services to decrease pain, improve strength and function.    Personal Factors and Comorbidities Comorbidity 3+    Comorbidities HTN, rheumatoid arthritis, Hx of thyroid CA    Examination-Activity Limitations  Other   looking around   Stability/Clinical Decision Making Stable/Uncomplicated    Rehab Potential Fair    PT Frequency 2x / week    PT Duration 8 weeks    PT Treatment/Interventions Therapeutic exercise;Therapeutic activities;Neuromuscular re-education;Patient/family education;Manual techniques;Dry needling;Electrical Stimulation    PT Next Visit Plan scapular strengthening, posture, mechanics, manual techniques, modalities PRN    PT Home Exercise Plan Access Code: [email or web address]    Consulted and Agree with Plan of Care Patient                 Joneen Boers PT, DPT  11/21/2021, 5:33 PM

## 2021-12-02 ENCOUNTER — Ambulatory Visit: Payer: Medicare Other | Attending: Internal Medicine

## 2021-12-02 ENCOUNTER — Telehealth: Payer: Self-pay

## 2021-12-02 DIAGNOSIS — M5412 Radiculopathy, cervical region: Secondary | ICD-10-CM | POA: Insufficient documentation

## 2021-12-02 DIAGNOSIS — M542 Cervicalgia: Secondary | ICD-10-CM | POA: Insufficient documentation

## 2021-12-02 NOTE — Telephone Encounter (Signed)
No show. Called pt who said that he was tired from the long drive during the weekend. Will be able to make it to his next appointment.

## 2021-12-04 ENCOUNTER — Ambulatory Visit: Payer: Medicare Other

## 2021-12-04 DIAGNOSIS — M5412 Radiculopathy, cervical region: Secondary | ICD-10-CM

## 2021-12-04 DIAGNOSIS — M542 Cervicalgia: Secondary | ICD-10-CM | POA: Diagnosis present

## 2021-12-04 NOTE — Therapy (Signed)
OUTPATIENT PHYSICAL THERAPY TREATMENT NOTE   Patient Name: Ralph Doyle MRN: [id number] DOB:1950/09/29, 71 y.o., male Today's Date: 12/04/2021  PCP: Lenard Simmer, MD REFERRING PROVIDER: Collier Salina, MD   PT End of Session - 12/04/21 1016     Visit Number 6    Number of Visits 17    Date for PT Re-Evaluation 01/02/22    Authorization Type 6    Authorization Time Period 10 progress report.    PT Start Time 1016    PT Stop Time 1058    PT Time Calculation (min) 42 min    Activity Tolerance Patient tolerated treatment well    Behavior During Therapy WFL for tasks assessed/performed                 Past Medical History:  Diagnosis Date   Cancer ([place])    Thyroid   Hyperlipidemia    Hypertension    Rheumatoid arthritis ([place])    Past Surgical History:  Procedure Laterality Date   COLONOSCOPY WITH PROPOFOL     COLONOSCOPY WITH PROPOFOL N/A 10/08/2021   Procedure: COLONOSCOPY WITH PROPOFOL;  Surgeon: Lucilla Lame, MD;  Location: [facility] END[facility];  Service: Endoscopy;  Laterality: N/A;   MANDIBLE SURGERY     THYROIDECTOMY Bilateral 05/16/2020   Procedure: TOTAL THYROIDECTOMY;  Surgeon: Clyde Canterbury, MD;  Location: [facility] ORS;  Service: ENT;  Laterality: Bilateral;   Patient Active Problem List   Diagnosis Date Noted   Neck pain 10/30/2021   Positive colorectal cancer screening using Cologuard test    Polyp of transverse colon    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 turning his head bothers the back of  his neck. Feels bilateral shoulder pain as well when he raises his arms up. Neck pain begain about a couple of years ago when pt got rheumatoid arthritis. Neck pain might have improved since onset. No unexplained chainges in weight. Pain does not wake him up at night. Thryroid was removed due to the cancer. No cancer anywhere else that he knows of.  PRECAUTIONS: Rheumatoid arthritis  SUBJECTIVE: Neck pain is not bad. Is so much better since coming to PT. Nothing like it was. Did do his HEP.    PAIN:  Are you having pain? 3-4/10 with R and L cervical rotation.    TODAY'S TREATMENT:  Therapeutic exercise    Seated manually resisted cervical flexion isometrics in neutral 10x3 with 5 second holds   Omega machine  Rows plate 25 for 65Y6 second holds for 2 sets then plate 35 for 50P5 seconds   B shoulder extension with B scapular retraction in standing, plate 15 for 46F6 seconds  Seated chin tuck with cervical rotation 10x each direction  Seated scapular retraction targeting lower trap muscle   R 10x with 5 second holds   Improved exercise technique, movement at target joints, use of target muscles after mod verbal, visual, tactile cues.    Manual therapy Seated STM B cervical paraspinal and upper trap muscles R > L to decrease fascial restrictions and muscle tension.  Decreased neck pain with R and L cervical rotation        Response to treatment Pt tolerated session well without aggravation of symptoms. Neck feels better after session reported.        Clinical impression  Continued working on improving anterior cervical, B scapular strength, mechanics, and decreasing cervical paraspinal and upper trap muscle tension to his neck. Improved neck comfort level with cervical rotation reported after session. Pt will benefit from continued skilled physical therapy services to decrease pain, improve strength and function.        PATIENT EDUCATION: Education details:  ther-ex Person educated: Patient Education method: Explanation, Demonstration, Corporate treasurer cues, and Verbal cues Education comprehension: verbalized understanding and returned demonstration   HOME EXERCISE PROGRAM: Access Code: [email or web address] URL: [email or web address] Date: 11/07/2021 Prepared by: Joneen Boers       Exercises - Seated Scapular Retraction  - 1 x daily - 7 x weekly - 3 sets - 10 reps - 5 seconds hold - Seated Cervical Retraction  - 1 x daily - 7 x weekly - 3 sets - 10 reps - 5 seconds hold  - Shoulder Extension with Resistance  - 3 x daily - 7 x weekly - 1 sets - 10 reps   PT Short Term Goals - 11/07/21 1056       PT SHORT TERM GOAL #1   Title Pt will be independent with his initial HEP to decrease pain, improve strenth and ability to look around more comfortably.    Baseline Pt has started his HEP (11/07/2021)    Time 3    Period Weeks    Status New    Target Date 11/28/21              PT Long Term Goals - 11/07/21 1057       PT LONG TERM GOAL #1   Title Pt will have a decrease in neck pain to 2/10 or less at worst to promote ability to look around, drive more comfortably.    Baseline 7/10 neck pain at most for the past 3 months (11/07/2021)    Time 8    Period Weeks    Status New    Target Date 01/02/22      PT LONG TERM GOAL #2   Title Pt will improve bilateral lower trap strength by at least 1/2  MMT to promote posture and ability to look around with less pain.    Baseline Manually resisted scapular retraction targeting lower trap: 4/5 R, 4-/5 L (11/07/2021)    Time 8    Period Weeks    Status New    Target Date 01/02/22      PT LONG TERM GOAL #3   Title Pt will improve his neck FOTO score by at least 10 points as a demonstration of improved function.    Baseline Neck FOTO 63 (11/07/2021)    Time 8    Period Weeks    Status New    Target Date 01/02/22              Plan - 12/04/21 1016     Clinical Impression Statement  Continued working on improving anterior cervical, B scapular strength, mechanics, and decreasing cervical paraspinal and upper trap muscle tension to his neck. Improved neck comfort level with cervical rotation reported after session. Pt will benefit from continued skilled physical therapy services to decrease pain, improve strength and function.    Personal Factors and Comorbidities Comorbidity 3+    Comorbidities  HTN, rheumatoid arthritis, Hx of thyroid CA    Examination-Activity Limitations Other   looking around   Stability/Clinical Decision Making Stable/Uncomplicated    Rehab Potential Fair    PT Frequency 2x / week    PT Duration 8 weeks    PT Treatment/Interventions Therapeutic exercise;Therapeutic activities;Neuromuscular re-education;Patient/family education;Manual techniques;Dry needling;Electrical Stimulation    PT Next Visit Plan scapular strengthening, posture, mechanics, manual techniques, modalities PRN    PT Home Exercise Plan Access Code: [email or web address]    Consulted and Agree with Plan of Care Patient                  Joneen Boers PT, DPT  12/04/2021, 12:01 PM

## 2021-12-09 ENCOUNTER — Ambulatory Visit: Payer: Medicare Other

## 2021-12-09 DIAGNOSIS — M542 Cervicalgia: Secondary | ICD-10-CM

## 2021-12-09 DIAGNOSIS — M5412 Radiculopathy, cervical region: Secondary | ICD-10-CM

## 2021-12-09 NOTE — Therapy (Signed)
OUTPATIENT PHYSICAL THERAPY TREATMENT NOTE   Patient Name: Ralph Doyle MRN: [id number] DOB:1950-11-18, 71 y.o., male Today's Date: 12/09/2021  PCP: Lenard Simmer, MD REFERRING PROVIDER: Collier Salina, MD   PT End of Session - 12/09/21 1152     Visit Number 7    Number of Visits 17    Date for PT Re-Evaluation 01/02/22    Authorization Type 7    Authorization Time Period 10 progress report.    PT Start Time 1152    PT Stop Time 1232    PT Time Calculation (min) 40 min    Activity Tolerance Patient tolerated treatment well    Behavior During Therapy WFL for tasks assessed/performed                  Past Medical History:  Diagnosis Date   Cancer ([place])    Thyroid   Hyperlipidemia    Hypertension    Rheumatoid arthritis ([place])    Past Surgical History:  Procedure Laterality Date   COLONOSCOPY WITH PROPOFOL     COLONOSCOPY WITH PROPOFOL N/A 10/08/2021   Procedure: COLONOSCOPY WITH PROPOFOL;  Surgeon: Lucilla Lame, MD;  Location: [facility];  Service: Endoscopy;  Laterality: N/A;   MANDIBLE SURGERY     THYROIDECTOMY Bilateral 05/16/2020   Procedure: TOTAL THYROIDECTOMY;  Surgeon: Clyde Canterbury, MD;  Location: [facility];  Service: ENT;  Laterality: Bilateral;   Patient Active Problem List   Diagnosis Date Noted   Neck pain 10/30/2021   Positive colorectal cancer screening using Cologuard test    Polyp of transverse colon    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 turning his head bothers the back  of his neck. Feels bilateral shoulder pain as well when he raises his arms up. Neck pain begain about a couple of years ago when pt got rheumatoid arthritis. Neck pain might have improved since onset. No unexplained chainges in weight. Pain does not wake him up at night. Thryroid was removed due to the cancer. No cancer anywhere else that he knows of.  PRECAUTIONS: Rheumatoid arthritis  SUBJECTIVE: Neck is doing good. Neck feels so much better since he started doing his exercises.   (Last scheduled visit 12/11/2021)   PAIN:  Are you having pain? 0/10 with R and L cervical rotation.    TODAY'S TREATMENT:  Therapeutic exercise    Seated chin tuck with cervical rotation 10x2 each direction  Seated manually resisted cervical flexion isometrics in neutral 10x3 with 5 second holds    Seated scapular retraction targeting lower trap muscle   R 10x3 with 5 second holds  Omega machine  Rows plate 35 for 56E3 second holds for 2 sets then   Standing B shoulder extension with B scapular retraction in standing, plate 15 for 32R5 seconds, then 5x5 seconds     Improved exercise technique, movement at target joints, use of target muscles after mod verbal, visual, tactile cues.      Manual therapy  Seated STM B cervical paraspinal and upper trap muscles to decrease fascial restrictions and muscle  tension.  Decreased neck pain with R and L cervical rotation        Response to treatment Pt tolerated session well without aggravation of symptoms.       Clinical impression  Pt making very good progress with decreased neck pain based on subjective reports. Continued working on improving anterior cervical, B scapular strength, mechanics, and decreasing cervical paraspinal and upper trap muscle tension to his neck. Pt tolerated session well without aggravation of symptoms. Pt will benefit from continued skilled physical therapy services to decrease pain, improve strength and function.         PATIENT EDUCATION: Education details: ther-ex Person educated: Patient Education method: Explanation, Demonstration, Corporate treasurer cues, and Verbal cues Education comprehension: verbalized understanding and returned demonstration   HOME EXERCISE PROGRAM: Access Code: [email or web address] URL: [email or web address] Date: 11/07/2021 Prepared by: Joneen Boers       Exercises - Seated Scapular Retraction  - 1 x daily - 7 x weekly - 3 sets - 10 reps - 5 seconds hold - Seated Cervical Retraction  - 1 x daily - 7 x weekly - 3 sets - 10 reps - 5 seconds hold  - Shoulder Extension with Resistance  - 3 x daily - 7 x weekly - 1 sets - 10 reps   PT Short Term Goals - 11/07/21 1056       PT SHORT TERM GOAL #1   Title Pt will be independent with his initial HEP to decrease pain, improve strenth and ability to look around more comfortably.    Baseline Pt has started his HEP (11/07/2021)    Time 3    Period Weeks    Status New    Target Date 11/28/21              PT Long Term Goals - 11/07/21 1057       PT LONG TERM GOAL #1   Title Pt will have a decrease in neck pain to 2/10 or less at worst to promote ability to look around, drive more comfortably.    Baseline 7/10 neck pain at most for the past 3 months (11/07/2021)    Time 8    Period Weeks    Status New    Target Date 01/02/22      PT LONG TERM GOAL #2   Title Pt will improve bilateral lower trap strength by at least 1/2  MMT to promote posture and ability to look around with less pain.    Baseline Manually resisted scapular retraction targeting lower trap: 4/5 R, 4-/5 L (11/07/2021)    Time 8    Period Weeks    Status New    Target Date 01/02/22      PT LONG TERM GOAL #3   Title Pt will improve his neck FOTO score by at least 10 points as a demonstration of improved function.    Baseline Neck FOTO 63 (11/07/2021)    Time 8    Period Weeks    Status New    Target Date 01/02/22              Plan -  12/09/21 1210     Clinical Impression Statement Pt making very good progress with decreased neck pain based on subjective reports. Continued working on improving anterior cervical, B scapular strength, mechanics, and decreasing cervical paraspinal and upper trap muscle tension to his neck. Pt tolerated session well without aggravation of symptoms. Pt will benefit from continued skilled physical therapy services to decrease pain,  improve strength and function.    Personal Factors and Comorbidities Comorbidity 3+    Comorbidities HTN, rheumatoid arthritis, Hx of thyroid CA    Examination-Activity Limitations Other   looking around   Stability/Clinical Decision Making Stable/Uncomplicated    Rehab Potential Fair    PT Frequency 2x / week    PT Duration 8 weeks    PT Treatment/Interventions Therapeutic exercise;Therapeutic activities;Neuromuscular re-education;Patient/family education;Manual techniques;Dry needling;Electrical Stimulation    PT Next Visit Plan scapular strengthening, posture, mechanics, manual techniques, modalities PRN    PT Home Exercise Plan Access Code: [email or web address]    Consulted and Agree with Plan of Care Patient                   Joneen Boers PT, DPT  12/09/2021, 12:47 PM

## 2021-12-11 ENCOUNTER — Ambulatory Visit: Payer: Medicare Other

## 2021-12-11 DIAGNOSIS — M542 Cervicalgia: Secondary | ICD-10-CM | POA: Diagnosis not present

## 2021-12-11 DIAGNOSIS — M5412 Radiculopathy, cervical region: Secondary | ICD-10-CM

## 2021-12-11 NOTE — Therapy (Signed)
OUTPATIENT PHYSICAL THERAPY TREATMENT NOTE   Patient Name: Ralph Doyle MRN: [id number] DOB:[birth year], 71 y.o., male Today's Date: 12/11/2021  PCP: Lenard Simmer, MD REFERRING PROVIDER: Collier Salina, MD   PT End of Session - 12/11/21 1155     Visit Number 8    Number of Visits 17    Date for PT Re-Evaluation 01/02/22    Authorization Type 8    Authorization Time Period 10 progress report.    PT Start Time 1155    PT Stop Time 1236    PT Time Calculation (min) 41 min    Activity Tolerance Patient tolerated treatment well    Behavior During Therapy WFL for tasks assessed/performed                   Past Medical History:  Diagnosis Date   Cancer ([place])    Thyroid   Hyperlipidemia    Hypertension    Rheumatoid arthritis ([place])    Past Surgical History:  Procedure Laterality Date   COLONOSCOPY WITH PROPOFOL     COLONOSCOPY WITH PROPOFOL N/A 10/08/2021   Procedure: COLONOSCOPY WITH PROPOFOL;  Surgeon: Lucilla Lame, MD;  Location: [facility];  Service: Endoscopy;  Laterality: N/A;   MANDIBLE SURGERY     THYROIDECTOMY Bilateral 05/16/2020   Procedure: TOTAL THYROIDECTOMY;  Surgeon: Clyde Canterbury, MD;  Location: [facility];  Service: ENT;  Laterality: Bilateral;   Patient Active Problem List   Diagnosis Date Noted   Neck pain 10/30/2021   Positive colorectal cancer screening using Cologuard test    Polyp of transverse colon    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 turning his head bothers the  back of his neck. Feels bilateral shoulder pain as well when he raises his arms up. Neck pain begain about a couple of years ago when pt got rheumatoid arthritis. Neck pain might have improved since onset. No unexplained chainges in weight. Pain does not wake him up at night. Thryroid was removed due to the cancer. No cancer anywhere else that he knows of.  PRECAUTIONS: Rheumatoid arthritis  SUBJECTIVE: Neck is doing good. 1-2/10 neck pain at worst for the past 7 days.     (Last scheduled visit 12/11/2021)   PAIN:  Are you having pain? 0/10 with R and L cervical rotation.    TODAY'S TREATMENT:  Therapeutic exercise   Seated chin tucks 10x5 seconds for 2 sets  Standing B scapular retraction gray band 10x5 seconds for 2 sets  Seated chin tuck with cervical rotation 10x2 each direction  Seated scapular retraction targeting lower trap muscle   R 10x3 with 5 second holds  Seated manually resisted cervical flexion isometrics in neutral 10x2 with 5 second holds   Omega machine    Standing B shoulder extension with B scapular retraction in standing, plate 15 for 93Z1 seconds, then 5x5 seconds       Improved exercise technique, movement at target joints, use of target muscles after mod verbal, visual, tactile cues.         Response to treatment  Pt tolerated session well without aggravation of symptoms.       Clinical impression Decreased neck pain to 1-2/10 at worst for the past 7 days. Continued working on improving anterior cervical, B scapular strength, and mechanics to decrease tension to his neck. Pt tolerated session well without aggravation of symptoms. Pt will benefit from continued skilled physical therapy services to decrease pain, improve strength and function.        PATIENT EDUCATION: Education details: ther-ex Person educated: Patient Education method: Explanation, Demonstration, Corporate treasurer cues, and Verbal cues Education comprehension: verbalized  understanding and returned demonstration   HOME EXERCISE PROGRAM: Access Code: [email or web address] URL: [email or web address] Date: 11/07/2021 Prepared by: Joneen Boers       Exercises - Seated Scapular Retraction  - 1 x daily - 7 x weekly - 3 sets - 10 reps - 5 seconds hold - Seated Cervical Retraction  - 1 x daily - 7 x weekly - 3 sets - 10 reps - 5 seconds hold  - Shoulder Extension with Resistance  - 3 x daily - 7 x weekly - 1 sets - 10 reps - Scapular Retraction with Resistance  - 1 x daily - 7 x weekly - 3 sets - 10 reps - 5 seconds hold       PT Short Term Goals - 12/11/21 1156       PT SHORT TERM GOAL #1   Title Pt will be independent with his initial HEP to decrease pain, improve strenth and ability to look around more comfortably.    Baseline Pt has started his HEP (11/07/2021);    Time 3    Period Weeks    Status New    Target Date 11/28/21              PT Long Term Goals - 12/11/21 1327       PT LONG TERM GOAL #1   Title Pt will have a decrease in neck pain to 2/10 or less at worst to promote ability to look around, drive more comfortably.    Baseline 7/10 neck pain at most for the past 3 months (11/07/2021); 1-2/10 at worst for the past 7 days (12/11/2021)    Time 8    Period Weeks    Status Achieved    Target Date 01/02/22      PT LONG TERM GOAL #2   Title Pt will improve bilateral lower trap strength by at least 1/2  MMT to promote posture and ability to look around with less pain.    Baseline Manually resisted scapular retraction targeting lower trap: 4/5 R, 4-/5 L (11/07/2021)    Time 8    Period Weeks    Status Deferred    Target Date 01/02/22      PT LONG TERM GOAL #3   Title Pt will improve his neck FOTO score by at least 10 points as a demonstration of improved function.    Baseline Neck FOTO 63 (11/07/2021); 68 (12/11/2021)    Time 8    Period Weeks    Status Partially Met    Target Date 01/02/22              Plan - 12/11/21  1209     Clinical Impression Statement Decreased neck pain to 1-2/10 at worst for the past 7 days. Continued working on improving anterior cervical, B scapular strength, and mechanics to decrease tension to his neck. Pt tolerated session well without aggravation of symptoms. Pt will benefit from continued skilled  physical therapy services to decrease pain, improve strength and function.    Personal Factors and Comorbidities Comorbidity 3+    Comorbidities HTN, rheumatoid arthritis, Hx of thyroid CA    Examination-Activity Limitations Other   looking around   Stability/Clinical Decision Making Stable/Uncomplicated    Clinical Decision Making Low    Rehab Potential Fair    PT Frequency 2x / week    PT Duration 8 weeks    PT Treatment/Interventions Therapeutic exercise;Therapeutic activities;Neuromuscular re-education;Patient/family education;Manual techniques;Dry needling;Electrical Stimulation    PT Next Visit Plan scapular strengthening, posture, mechanics, manual techniques, modalities PRN    PT Home Exercise Plan Access Code: [email or web address]    Consulted and Agree with Plan of Care Patient                    Joneen Boers PT, DPT  12/11/2021, 1:31 PM

## 2021-12-13 ENCOUNTER — Telehealth: Payer: Self-pay | Admitting: Internal Medicine

## 2021-12-13 NOTE — Telephone Encounter (Signed)
Yes I recommend he can try taking the prednisone 10 mg daily for 5 or 6 days see if this helps. If not he can reach back out to [place].

## 2021-12-13 NOTE — Telephone Encounter (Signed)
Spoke with patient and advised Dr. Benjamine Mola recommends he can try taking the prednisone 10 mg daily for 5 or 6 days see if this helps. If not he can reach back out to [place].

## 2021-12-13 NOTE — Telephone Encounter (Signed)
Patient called stating he is experiencing bilateral hand pain.  Patient states he is currently taking Tylenol 500 mg and Tumeric.  Patient requested a return call to let him know if he can take Prednisone.  Patient states he has 13 tablets remaining from his last prescription of Prednisone 5 mg.

## 2021-12-17 ENCOUNTER — Ambulatory Visit: Payer: Medicare Other

## 2021-12-17 DIAGNOSIS — M5412 Radiculopathy, cervical region: Secondary | ICD-10-CM

## 2021-12-17 DIAGNOSIS — M542 Cervicalgia: Secondary | ICD-10-CM

## 2021-12-17 NOTE — Therapy (Signed)
OUTPATIENT PHYSICAL THERAPY TREATMENT NOTE   Patient Name: Ralph Doyle MRN: [id number] DOB:[birth year], 71 y.o., male Today's Date: 12/17/2021  PCP: Lenard Simmer, MD REFERRING PROVIDER: Collier Salina, MD   PT End of Session - 12/17/21 1547     Visit Number 9    Number of Visits 17    Date for PT Re-Evaluation 01/02/22    Authorization Type 9    Authorization Time Period 10 progress report.    PT Start Time 1547    PT Stop Time 1627    PT Time Calculation (min) 40 min    Activity Tolerance Patient tolerated treatment well    Behavior During Therapy WFL for tasks assessed/performed                    Past Medical History:  Diagnosis Date   Cancer ([place])    Thyroid   Hyperlipidemia    Hypertension    Rheumatoid arthritis ([place])    Past Surgical History:  Procedure Laterality Date   COLONOSCOPY WITH PROPOFOL     COLONOSCOPY WITH PROPOFOL N/A 10/08/2021   Procedure: COLONOSCOPY WITH PROPOFOL;  Surgeon: Lucilla Lame, MD;  Location: [facility] ENDOSCOPY;  Service: Endoscopy;  Laterality: N/A;   MANDIBLE SURGERY     THYROIDECTOMY Bilateral 05/16/2020   Procedure: TOTAL THYROIDECTOMY;  Surgeon: Clyde Canterbury, MD;  Location: [facility] ORS;  Service: ENT;  Laterality: Bilateral;   Patient Active Problem List   Diagnosis Date Noted   Neck pain 10/30/2021   Positive colorectal cancer screening using Cologuard test    Polyp of transverse colon    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 turning his head bothers the  back of his neck. Feels bilateral shoulder pain as well when he raises his arms up. Neck pain begain about a couple of years ago when pt got rheumatoid arthritis. Neck pain might have improved since onset. No unexplained chainges in weight. Pain does not wake him up at night. Thryroid was removed due to the cancer. No cancer anywhere else that he knows of.  PRECAUTIONS: Rheumatoid arthritis  SUBJECTIVE: Neck is doing good.      (Last scheduled visit 12/11/2021)   PAIN:  Are you having pain? 0/10 with R and L cervical rotation.    TODAY'S TREATMENT:  Therapeutic exercise   Seated chin tuck with cervical rotation 10x each direction   Seated manually resisted cervical flexion isometrics in neutral 10x2 with 5 second holds   Omega machine      Rows plate 35 for 05L9 second holds for 3 sets    Standing B shoulder extension with B scapular retraction in standing, plate 15 for 76B3 seconds, then 5x5 seconds    Seated scapular retraction targeting lower trap muscle   R 10x5 seconds  L 10x5 seconds        Improved exercise technique, movement at target joints, use of target muscles after mod verbal, visual, tactile cues.      Manual therapy Seated STM B upper trap muscles to decrease tension   Response to  treatment Pt tolerated session well without aggravation of symptoms.       Clinical impression Pt making very good progress with decreased neck pain based on subjective reports.  Continued working on improving anterior cervical, B scapular strength, and mechanics to decrease tension to his neck. Pt tolerated session well without aggravation of symptoms. Pt will benefit from continued skilled physical therapy services to decrease pain, improve strength and function.        PATIENT EDUCATION: Education details: ther-ex Person educated: Patient Education method: Explanation, Demonstration, Corporate treasurer cues, and Verbal cues Education comprehension: verbalized  understanding and returned demonstration   HOME EXERCISE PROGRAM: Access Code: [email or web address] URL: [email or web address] Date: 11/07/2021 Prepared by: Joneen Boers       Exercises - Seated Scapular Retraction  - 1 x daily - 7 x weekly - 3 sets - 10 reps - 5 seconds hold - Seated Cervical Retraction  - 1 x daily - 7 x weekly - 3 sets - 10 reps - 5 seconds hold  - Shoulder Extension with Resistance  - 3 x daily - 7 x weekly - 1 sets - 10 reps - Scapular Retraction with Resistance  - 1 x daily - 7 x weekly - 3 sets - 10 reps - 5 seconds hold       PT Short Term Goals - 12/11/21 1156       PT SHORT TERM GOAL #1   Title Pt will be independent with his initial HEP to decrease pain, improve strenth and ability to look around more comfortably.    Baseline Pt has started his HEP (11/07/2021);    Time 3    Period Weeks    Status New    Target Date 11/28/21              PT Long Term Goals - 12/11/21 1327       PT LONG TERM GOAL #1   Title Pt will have a decrease in neck pain to 2/10 or less at worst to promote ability to look around, drive more comfortably.    Baseline 7/10 neck pain at most for the past 3 months (11/07/2021); 1-2/10 at worst for the past 7 days (12/11/2021)    Time 8    Period Weeks    Status Achieved    Target Date 01/02/22      PT LONG TERM GOAL #2   Title Pt will improve bilateral lower trap strength by at least 1/2  MMT to promote posture and ability to look around with less pain.    Baseline Manually resisted scapular retraction targeting lower trap: 4/5 R, 4-/5 L (11/07/2021)    Time 8    Period Weeks    Status Deferred    Target Date 01/02/22      PT LONG TERM GOAL #3   Title Pt will improve his neck FOTO score by at least 10 points as a demonstration of improved function.    Baseline Neck FOTO 63 (11/07/2021); 68 (12/11/2021)    Time 8    Period Weeks    Status Partially Met    Target Date 01/02/22              Plan - 12/17/21  1543     Clinical Impression Statement Pt making very good progress with decreased neck pain based on subjective reports.  Continued working on improving anterior cervical, B scapular strength, and mechanics to decrease tension to his neck. Pt tolerated session well without aggravation of symptoms. Pt  will benefit from continued skilled physical therapy services to decrease pain, improve strength and function.    Personal Factors and Comorbidities Comorbidity 3+    Comorbidities HTN, rheumatoid arthritis, Hx of thyroid CA    Examination-Activity Limitations Other   looking around   Stability/Clinical Decision Making Stable/Uncomplicated    Clinical Decision Making Low    Rehab Potential Fair    PT Frequency 2x / week    PT Duration 8 weeks    PT Treatment/Interventions Therapeutic exercise;Therapeutic activities;Neuromuscular re-education;Patient/family education;Manual techniques;Dry needling;Electrical Stimulation    PT Next Visit Plan scapular strengthening, posture, mechanics, manual techniques, modalities PRN    PT Home Exercise Plan Access Code: [email or web address]    Consulted and Agree with Plan of Care Patient                     Joneen Boers PT, DPT  12/17/2021, 4:29 PM

## 2021-12-19 ENCOUNTER — Ambulatory Visit: Payer: Medicare Other

## 2021-12-19 DIAGNOSIS — M542 Cervicalgia: Secondary | ICD-10-CM

## 2021-12-19 DIAGNOSIS — M5412 Radiculopathy, cervical region: Secondary | ICD-10-CM

## 2021-12-19 NOTE — Therapy (Signed)
OUTPATIENT PHYSICAL THERAPY TREATMENT NOTE Progress Report (11/07/2021 - 12/19/2021) And Discharge Summary   Patient Name: Ralph Doyle MRN: [id number] DOB:March 23, 1951, 71 y.o., male Today's Date: 12/19/2021  PCP: Lenard Simmer, MD REFERRING PROVIDER: Collier Salina, MD   PT End of Session - 12/19/21 1502     Visit Number 10    Number of Visits 17    Date for PT Re-Evaluation 01/02/22    Authorization Type 10    Authorization Time Period 10 progress report.    PT Start Time 1502    PT Stop Time 1542    PT Time Calculation (min) 40 min    Activity Tolerance Patient tolerated treatment well    Behavior During Therapy WFL for tasks assessed/performed                     Past Medical History:  Diagnosis Date   Cancer ([place])    Thyroid   Hyperlipidemia    Hypertension    Rheumatoid arthritis ([place])    Past Surgical History:  Procedure Laterality Date   COLONOSCOPY WITH PROPOFOL     COLONOSCOPY WITH PROPOFOL N/A 10/08/2021   Procedure: COLONOSCOPY WITH PROPOFOL;  Surgeon: Lucilla Lame, MD;  Location: [facility];  Service: Endoscopy;  Laterality: N/A;   MANDIBLE SURGERY     THYROIDECTOMY Bilateral 05/16/2020   Procedure: TOTAL THYROIDECTOMY;  Surgeon: Clyde Canterbury, MD;  Location: [facility] OR[facility];  Service: ENT;  Laterality: Bilateral;   Patient Active Problem List   Diagnosis Date Noted   Neck pain 10/30/2021   Positive colorectal cancer screening using Cologuard test    Polyp of transverse colon    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 turning his head bothers the back of his neck. Feels bilateral shoulder pain as well when he raises his arms up. Neck pain begain about a couple of years ago when pt got rheumatoid arthritis. Neck pain might have improved since onset. No unexplained chainges in weight. Pain does not wake him up at night. Thryroid was removed due to the cancer. No cancer anywhere else that he knows of.  PRECAUTIONS: Rheumatoid arthritis  SUBJECTIVE: Neck has not felt this good and can't remember when was the last time.      (Last scheduled visit 12/11/2021)   PAIN:  Are you having pain? 0/10    TODAY'S TREATMENT:  Therapeutic exercise   Seated manually resisted scapular retraction targeting the lower trap  R 10x5 seconds for 2 sets  L 10x5 seconds for 2 sets  Reviewed progress with PT towards goals.   Seated R and L trunk rotation to promote mobility 10x2 each direction  Standing chin tucks, back against the door, head on pillow 10x5 seconds for 2 sets  Seated self manually resisted cervical flexion isometrics in neutral 10x with 5 second holds     Omega machine      Rows plate 35 for 76E8 second holds for 3 sets   Standing B shoulder extension with B scapular retraction in standing, plate 15 for 31D1 seconds, then  5x5 seconds       Response to treatment Pt tolerated session well without aggravation of symptoms.       Clinical impression Pt demonstrates significant decrease in neck pain, improved B scapular strength, as well as improved function since initial evaluation. Pt has made very good progress with PT towards goals. Skilled physical therapy services discharged with pt continuing progress with his exercises at home.        PATIENT EDUCATION: Education details: ther-ex Person educated: Patient Education method: Explanation, Demonstration, Corporate treasurer cues, and Verbal cues Education comprehension: verbalized understanding and returned  demonstration   HOME EXERCISE PROGRAM: Access Code: [email or web address] URL: [email or web address] Date: 11/07/2021 Prepared by: Joneen Boers       Exercises - Seated Scapular Retraction  - 1 x daily - 7 x weekly - 3 sets - 10 reps - 5 seconds hold - Seated Cervical Retraction  - 1 x daily - 7 x weekly - 3 sets - 10 reps - 5 seconds hold  - Shoulder Extension with Resistance  - 3 x daily - 7 x weekly - 1 sets - 10 reps - Scapular Retraction with Resistance  - 1 x daily - 7 x weekly - 3 sets - 10 reps - 5 seconds hold       PT Short Term Goals - 12/19/21 1550       PT SHORT TERM GOAL #1   Title Pt will be independent with his initial HEP to decrease pain, improve strenth and ability to look around more comfortably.    Baseline Pt has started his HEP (11/07/2021); no questions (12/19/2021)    Time 3    Period Weeks    Status Achieved    Target Date 11/28/21              PT Long Term Goals - 12/19/21 1504       PT LONG TERM GOAL #1   Title Pt will have a decrease in neck pain to 2/10 or less at worst to promote ability to look around, drive more comfortably.    Baseline 7/10 neck pain at most for the past 3 months (11/07/2021); 1-2/10 at worst for the past 7 days (12/11/2021), (12/19/2021)    Time 8    Period Weeks    Status Achieved    Target Date 01/02/22      PT LONG TERM GOAL #2   Title Pt will improve bilateral lower trap strength by at least 1/2  MMT to promote posture and ability to look around with less pain.    Baseline Manually resisted scapular retraction targeting lower trap: 4/5 R, 4-/5 L (11/07/2021); 4+/5 R and L (12/19/2021)    Time 8    Period Weeks    Status Achieved    Target Date 01/02/22      PT LONG TERM GOAL #3   Title Pt will improve his neck FOTO score by at least 10 points as a demonstration of improved function.    Baseline Neck FOTO 63 (11/07/2021); 68 (12/11/2021); 78 (12/19/2021)    Time 8    Period Weeks    Status Achieved    Target  Date 01/02/22              Plan - 12/19/21 1501     Clinical Impression Statement Pt demonstrates significant decrease in neck pain, improved B scapular strength, as well as improved function since initial evaluation. Pt has made very good progress with PT towards goals. Skilled physical therapy  services discharged with pt continuing progress with his exercises at home.    Personal Factors and Comorbidities Comorbidity 3+    Comorbidities HTN, rheumatoid arthritis, Hx of thyroid CA    Examination-Activity Limitations Other   looking around   Stability/Clinical Decision Making --    Rehab Potential --    PT Frequency --    PT Duration --    PT Treatment/Interventions Therapeutic exercise;Therapeutic activities;Neuromuscular re-education;Patient/family education;Manual techniques    PT Next Visit Plan Continue progress with his exercises at home.    PT Home Exercise Plan Access Code: [email or web address]    Consulted and Agree with Plan of Care Patient                      Joneen Boers PT, DPT  12/19/2021, 3:53 PM

## 2022-01-27 NOTE — Progress Notes (Signed)
Office Visit Note  Patient: Ralph Doyle             Date of Birth: [birth year]           MRN: [id number]             PCP: Lenard Simmer, MD Referring: Lenard Simmer, MD Visit Date: 02/04/2022   Subjective:  Rheumatoid Arthritis (Doing good)   History of Present Illness: Ralph Doyle is a 71 y.o. 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 He has not required any additional steroid treatments since our last visit. He had some ultrasound shockwave treatment for tendonitis symptoms with a good benefit. Physical therapy has improved shoulder pain and range of motion. He is trying a new Aeon patch device.   Previous HPI 10/30/2021 Ralph Doyle is a 71 y.o. male here for follow up for seropositive RA on MTX 38.1 mg PO weekly folic acid 1 mg daily tried tapering prednisone and trying turmeric supplementation.  Overall symptoms are doing fairly well. He has some pain in is left middle finger hands otherwise about the same as before. He is having pain at the base of the neck more than usual, going for massages these temporarily help. Pain is somewhat worse with looking side to side.   Review of Systems  Constitutional:  Negative for fatigue.  HENT:  Negative for mouth sores and mouth dryness.   Eyes:  Positive for dryness.  Respiratory:  Negative for shortness of breath.   Cardiovascular:  Negative for chest pain and palpitations.  Gastrointestinal:  Negative for blood in stool, constipation and diarrhea.  Endocrine: Positive for increased urination.  Genitourinary:  Negative for involuntary urination.  Musculoskeletal:  Positive for joint pain, joint pain and morning stiffness. Negative for gait problem, joint swelling, myalgias, muscle weakness, muscle tenderness and myalgias.  Skin:  Positive for rash. Negative for color change, hair loss and sensitivity to sunlight.  Allergic/Immunologic: Negative for susceptible to infections.   Neurological:  Negative for dizziness and headaches.  Hematological:  Negative for swollen glands.  Psychiatric/Behavioral:  Positive for depressed mood and sleep disturbance. The patient is not nervous/anxious.     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   Pain in right hand 10/12/2020   Pain of left hand 10/12/2020   Flexor tenosynovitis of finger 08/15/2020   Rheumatoid arthritis with rheumatoid factor of multiple sites without organ or systems involvement ([place]) 06/14/2020   High risk medication use 06/14/2020   Thyroid malignant neoplasm ([place]) 05/16/2020    Past Medical History:  Diagnosis Date   Cancer ([place])    Thyroid   Hyperlipidemia    Hypertension    Rheumatoid arthritis ([place])     Family History  Problem Relation Age of Onset   Diabetes Mother    Hypertension Mother    Arthritis Mother    Diabetes Father    Hypertension Father    Diabetes Brother    Hypertension Brother    Hypertension Brother    Past Surgical History:  Procedure Laterality Date   COLONOSCOPY WITH PROPOFOL     COLONOSCOPY WITH PROPOFOL N/A 10/08/2021   Procedure: COLONOSCOPY WITH PROPOFOL;  Surgeon: Lucilla Lame, MD;  Location: [facility];  Service: Endoscopy;  Laterality: N/A;  MANDIBLE SURGERY     THYROIDECTOMY Bilateral 05/16/2020   Procedure: TOTAL THYROIDECTOMY;  Surgeon: Clyde Canterbury, MD;  Location: [facility];  Service: ENT;  Laterality: Bilateral;   Social History   Social History Narrative   Not on file   Immunization History  Administered Date(s) Administered   PFIZER(Purple Top)SARS-COV-2 Vaccination 08/25/2019, 09/20/2019     Objective: Vital Signs: BP 138/79 (BP Location: Left Arm, Patient Position: Sitting, Cuff Size: Normal)   Pulse 60   Resp 15   Ht 5' 8.5" (1.74 m)   Wt 199 lb  (90.3 kg)   BMI 29.82 kg/m    Physical Exam Cardiovascular:     Rate and Rhythm: Normal rate and regular rhythm.  Pulmonary:     Effort: Pulmonary effort is normal.     Breath sounds: Normal breath sounds.  Skin:    General: Skin is warm and dry.  Neurological:     Mental Status: He is alert.  Psychiatric:        Mood and Affect: Mood normal.      Musculoskeletal Exam:  Neck full ROM no tenderness Shoulders full ROM no tenderness or swelling Elbows full ROM no tenderness or swelling Wrists full ROM no tenderness or swelling Slight 4th-5th finger flexion contracture on left hand, no tenderness or palpable synovitis Knees full ROM no tenderness or swelling  CDAI Exam: CDAI Score: 2  Patient Global: 10 mm; Provider Global: 10 mm Swollen: 0 ; Tender: 0  Joint Exam 02/04/2022   All documented joints were normal     Investigation: No additional findings.  Imaging: No results found.  Recent Labs: Lab Results  Component Value Date   WBC 8.4 02/04/2022   HGB 15.9 02/04/2022   PLT 274 02/04/2022   NA 142 02/04/2022   K 4.4 02/04/2022   CL 105 02/04/2022   CO2 30 02/04/2022   GLUCOSE 81 02/04/2022   BUN 16 02/04/2022   CREATININE 1.31 02/04/2022   BILITOT 0.7 02/04/2022   AST 12 02/04/2022   ALT 14 02/04/2022   PROT 6.5 02/04/2022   CALCIUM 9.9 02/04/2022   GFRAA 69 11/06/2020    Speciality Comments: No specialty comments available.  Procedures:  No procedures performed Allergies: Patient has no known allergies.   Assessment / Plan:     Visit Diagnoses: Rheumatoid arthritis with rheumatoid factor of multiple sites without organ or systems involvement ([place]) - Plan: Sedimentation rate, Ambulatory referral to Occupational Therapy  Appears to be in remission or low disease activity. Checking ESR for disease activity monitoring. Plan to continue methotrexate 12.5 mg PO weekly and folic acid 1 mg weekly.  Referring to occupational therapy for existing flexion  contractures of the hands.  I am not sure to what extent these are related to his RA versus occupation and instrument use probably benefit with rehab for range of motion.  High risk medication use - methotrexate 12.5 mg p.o - Plan: CBC with Differential/Platelet, COMPLETE METABOLIC PANEL WITH GFR  Checking CBC and CMP for methotrexate toxicity monitoring. No interval infections or problems tolerating medication.  Neck pain  Neck and shoulder mobility doing better after PT course currently doing well.  Orders: Orders Placed This Encounter  Procedures   Sedimentation rate   CBC with Differential/Platelet   COMPLETE METABOLIC PANEL WITH GFR   Ambulatory referral to Occupational Therapy   No orders of the defined types were placed in this encounter.    Follow-Up Instructions: Return in about 3 months (around 05/07/2022) for  RA on MTX f/u 3mo.   CCollier Salina MD  Note - This record has been created using [employer]  Chart creation errors have been sought, but may not always  have been located. Such creation errors do not reflect on  the standard of medical care.

## 2022-02-04 ENCOUNTER — Ambulatory Visit: Payer: Medicare Other | Attending: Internal Medicine | Admitting: Internal Medicine

## 2022-02-04 ENCOUNTER — Encounter: Payer: Self-pay | Admitting: Internal Medicine

## 2022-02-04 VITALS — BP 138/79 | HR 60 | Resp 15 | Ht 68.5 in | Wt 199.0 lb

## 2022-02-04 DIAGNOSIS — M0579 Rheumatoid arthritis with rheumatoid factor of multiple sites without organ or systems involvement: Secondary | ICD-10-CM | POA: Insufficient documentation

## 2022-02-04 DIAGNOSIS — M542 Cervicalgia: Secondary | ICD-10-CM | POA: Insufficient documentation

## 2022-02-04 DIAGNOSIS — Z79899 Other long term (current) drug therapy: Secondary | ICD-10-CM | POA: Insufficient documentation

## 2022-02-05 LAB — CBC WITH DIFFERENTIAL/PLATELET
Absolute Monocytes: 655 cells/uL (ref 200–950)
Basophils Absolute: 59 cells/uL (ref 0–200)
Basophils Relative: 0.7 %
Eosinophils Absolute: 84 cells/uL (ref 15–500)
Eosinophils Relative: 1 %
HCT: 46.9 % (ref 38.5–50.0)
Hemoglobin: 15.9 g/dL (ref 13.2–17.1)
Lymphs Abs: 1898 cells/uL (ref 850–3900)
MCH: 31.9 pg (ref 27.0–33.0)
MCHC: 33.9 g/dL (ref 32.0–36.0)
MCV: 94 fL (ref 80.0–100.0)
MPV: 9.8 fL (ref 7.5–12.5)
Monocytes Relative: 7.8 %
Neutro Abs: 5704 cells/uL (ref 1500–7800)
Neutrophils Relative %: 67.9 %
Platelets: 274 10*3/uL (ref 140–400)
RBC: 4.99 10*6/uL (ref 4.20–5.80)
RDW: 14.4 % (ref 11.0–15.0)
Total Lymphocyte: 22.6 %
WBC: 8.4 10*3/uL (ref 3.8–10.8)

## 2022-02-05 LAB — COMPLETE METABOLIC PANEL WITH GFR
AG Ratio: 1.7 (calc) (ref 1.0–2.5)
ALT: 14 U/L (ref 9–46)
AST: 12 U/L (ref 10–35)
Albumin: 4.1 g/dL (ref 3.6–5.1)
Alkaline phosphatase (APISO): 62 U/L (ref 35–144)
BUN: 16 mg/dL (ref 7–25)
CO2: 30 mmol/L (ref 20–32)
Calcium: 9.9 mg/dL (ref 8.6–10.3)
Chloride: 105 mmol/L (ref 98–110)
Creat: 1.31 mg/dL (ref 0.70–1.35)
Globulin: 2.4 g/dL (calc) (ref 1.9–3.7)
Glucose, Bld: 81 mg/dL (ref 65–99)
Potassium: 4.4 mmol/L (ref 3.5–5.3)
Sodium: 142 mmol/L (ref 135–146)
Total Bilirubin: 0.7 mg/dL (ref 0.2–1.2)
Total Protein: 6.5 g/dL (ref 6.1–8.1)
eGFR: 59 mL/min/{1.73_m2} — ABNORMAL LOW (ref >=60)

## 2022-02-05 LAB — SEDIMENTATION RATE: Sed Rate: 2 mm/h (ref 0–20)

## 2022-02-19 ENCOUNTER — Ambulatory Visit: Payer: Medicare Other | Attending: Internal Medicine

## 2022-02-19 DIAGNOSIS — M25642 Stiffness of left hand, not elsewhere classified: Secondary | ICD-10-CM | POA: Insufficient documentation

## 2022-02-19 DIAGNOSIS — M25641 Stiffness of right hand, not elsewhere classified: Secondary | ICD-10-CM | POA: Diagnosis present

## 2022-02-19 DIAGNOSIS — M79642 Pain in left hand: Secondary | ICD-10-CM | POA: Diagnosis present

## 2022-02-19 DIAGNOSIS — M79641 Pain in right hand: Secondary | ICD-10-CM | POA: Diagnosis present

## 2022-02-19 NOTE — Therapy (Unsigned)
OUTPATIENT OCCUPATIONAL THERAPY ORTHO EVALUATION  Patient Name: Ralph Doyle MRN: [id number] DOB:30-Nov-1950, 71 y.o., male Today's Date: 02/19/2022  PCP: Dr. Belinda Fisher REFERRING PROVIDER: Dr. Vernelle Emerald (Rheumatology)    Past Medical History:  Diagnosis Date   Cancer [facility])    Thyroid   Hyperlipidemia    Hypertension    Rheumatoid arthritis [facility])    Past Surgical History:  Procedure Laterality Date   COLONOSCOPY WITH PROPOFOL     COLONOSCOPY WITH PROPOFOL N/A 10/08/2021   Procedure: COLONOSCOPY WITH PROPOFOL;  Surgeon: Lucilla Lame, MD;  Location: [facility] ENDOSCOPY;  Service: Endoscopy;  Laterality: N/A;   MANDIBLE SURGERY     THYROIDECTOMY Bilateral 05/16/2020   Procedure: TOTAL THYROIDECTOMY;  Surgeon: Clyde Canterbury, MD;  Location: [facility] ORS;  Service: ENT;  Laterality: Bilateral;   Patient Active Problem List   Diagnosis Date Noted   Neck pain 10/30/2021   Positive colorectal cancer screening using Cologuard test    Polyp of transverse colon    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   Pain in right hand 10/12/2020   Pain of left hand 10/12/2020   Flexor tenosynovitis of finger 08/15/2020   Rheumatoid arthritis with rheumatoid factor of multiple sites without organ or systems involvement ([place]) 06/14/2020   High risk medication use 06/14/2020   Thyroid malignant neoplasm ([place]) 05/16/2020    ONSET DATE: 02/04/22 (date of referral)  REFERRING DIAG: M05.79 (ICD-10-CM) - Rheumatoid arthritis with rheumatoid factor of multiple sites without organ or systems involvement ([place])  THERAPY DIAG:  Bilateral hand pain  Rationale for Evaluation and Treatment Rehabilitation  SUBJECTIVE:   SUBJECTIVE STATEMENT: Pt is a full time musician.  Plays the bass guitar.  Pt reports worsening hand pain and stiffness in the fingers. Pt accompanied by: self  PERTINENT HISTORY: Per rheumatology office note on 02/04/22, Ralph Doyle is a 71 y.o. 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  Overall symptoms are doing fairly well. He has some pain in is left middle finger hands otherwise about the same as before. He is having pain at the base of the neck more than usual, going for massages these temporarily help. Pain is somewhat worse with looking side to side.  PRECAUTIONS: None  WEIGHT BEARING RESTRICTIONS No  PAIN:  Are you having pain? 2/10 pain at rest, bad days with activity 5-6/10; pt has copper compression gloves    FALLS: Has patient fallen in last 6 months? No  LIVING ENVIRONMENT: Lives with: spouse  PLOF: Indep with daily tasks and working full time as a Therapist, nutritional   PATIENT GOALS: improve pain and stiffness in hands  OBJECTIVE:   HAND DOMINANCE: Right  ADLs: Pt reports some increased pain in hands when turning wheel on riding mower and when playing guitar  Overall ADLs: indep but with some occasional discomfort in hands  Transfers/ambulation related to ADLs: indep Eating: reports ability to cut food and open containers ok Grooming: indep UB Dressing: indep LB Dressing: indep Toileting: indep Bathing: indep Tub Shower transfers: indep Equipment: none  FUNCTIONAL OUTCOME MEASURES: FOTO: 69  UPPER EXTREMITY ROM     Active ROM Right eval Left eval  Shoulder flexion 158 155  Shoulder abduction 180 160  Shoulder adduction    Shoulder extension    Shoulder internal rotation WNL WNL  Shoulder external rotation WNL WNL  Elbow flexion    Elbow extension  Wrist flexion 80 75  Wrist extension 55 62  Wrist ulnar deviation    Wrist radial deviation    Wrist pronation    Wrist supination    (Blank rows = not tested)  Active ROM Right eval Left eval  Thumb MCP (0-60)    Thumb IP (0-80)    Thumb Radial abd/add (0-55)     Thumb Palmar abd/add (0-45)     Thumb Opposition to Small Finger     Index MCP (0-90)  -30* ext    Index PIP (0-100)     Index DIP (0-70)      Long MCP (0-90)   -30* ext   Long PIP (0-100)      Long DIP (0-70)      Ring MCP (0-90)      Ring PIP (0-100) -15* ext     Ring DIP (0-70)      Little MCP (0-90)      Little PIP (0-100)      Little DIP (0-70)      (Blank rows = not tested)   UPPER EXTREMITY MMT:   BUEs grossly 4+/5  HAND FUNCTION: Grip strength: Right: 100 lbs; Left: 103 lbs, Lateral pinch: Right: 27 lbs, Left: 24 lbs, and 3 point pinch: Right: 19 lbs, Left: 24 lbs  COORDINATION: 9 Hole Peg test: Right: 24 sec; Left: 19 sec  SENSATION: Occasional numbness in the radial wrist and thenar eminence bilat hands  EDEMA: none  COGNITION: Overall cognitive status: Within functional limits for tasks assessed  TODAY'S TREATMENT:  OT eval completed.  Objective measures taken and goals established.   PATIENT EDUCATION: Education details: OT role, goals, poc Person educated: Patient Education method: Explanation and Verbal cues Education comprehension: verbalized understanding   HOME EXERCISE PROGRAM: To be initiated next session  GOALS: Goals reviewed with patient? Yes  SHORT TERM GOALS: Target date: 03/13/22    Pt will be indep with HEP for improving ROM in BUEs Baseline: HEP not yet initiated Goal status: INITIAL   LONG TERM GOALS: Target date: 04/02/22    Pt will increase FOTO score to 70 or greater to indicate pt perceived improvement in functional performance with daily tasks. Baseline: 69 Goal status: INITIAL  2.  Pt will verbalize at least 3 joint protection strategies for long term maintenance of RA symptoms. Baseline: Education not yet initiated Goal status: INITIAL  3.  Pt will verbalize at least 2 pain management strategies to manage pain in BUEs for better tolerance to daily tasks. Baseline: Education not yet initiated Goal status: INITIAL  4.  Pt will be indep to verbalize/demo splinting regimen with ring splints (ie:  schedule/precautions/donning/doffing) for minimizing flexion contractures in R RF and L LF. Baseline: Splints not yet obtained Goal status: INITIAL   ASSESSMENT:  CLINICAL IMPRESSION: Patient is a 71 y.o. male who was seen today for occupational therapy evaluation for bilat hand pain and digit flexion contractures related to RA.  Pt is a full time musician/bass player and is reporting increased pain in hands when playing his instrument, as well as after gripping handles/turning steering wheel when mowing the lawn.  Pt also reports increased stiffness in the shoulders, and states that pain in BUEs can reach up to 5-6/10 pain.  Pt reports that he does have copper compression gloves which he occasionally wears for comfort.  L hand 2nd and 3rd MCP joints lacking 30 degrees of ext, and R hand 4th digit PIP lacking 30 degrees of extension.  Pt  will benefit from skilled OT for HEP instruction for increasing BUE ROM, education on pain management, joint protection, and activity modification strategies, and possible splinting to reduce flexion contractures in bilat hands (may benefit from ring splints).    PERFORMANCE DEFICITS in functional skills including ADLs, IADLs, sensation, ROM, pain, flexibility, body mechanics, decreased knowledge of precautions, decreased knowledge of use of DME, and UE functional use.  IMPAIRMENTS are limiting patient from ADLs, IADLs, work, and leisure.   COMORBIDITIES may have co-morbidities  that affects occupational performance. Patient will benefit from skilled OT to address above impairments and improve overall function.  MODIFICATION OR ASSISTANCE TO COMPLETE EVALUATION: No modification of tasks or assist necessary to complete an evaluation.  OT OCCUPATIONAL PROFILE AND HISTORY: Problem focused assessment: Including review of records relating to presenting problem.  CLINICAL DECISION MAKING: LOW - limited treatment options, no task modification necessary  REHAB  POTENTIAL: Good  EVALUATION COMPLEXITY: Low      PLAN: OT FREQUENCY: 2x/week  OT DURATION: 6 weeks  PLANNED INTERVENTIONS: self care/ADL training, therapeutic exercise, therapeutic activity, manual therapy, passive range of motion, splinting, paraffin, moist heat, patient/family education, and DME and/or AE instructions  RECOMMENDED OTHER SERVICES: N/A  CONSULTED AND AGREED WITH PLAN OF CARE: Patient  PLAN FOR NEXT SESSION: Initiate HEP and joint protection strategies   Leta Speller, MS, OTR/L  Darleene Cleaver, OT 02/19/2022, 9:43 AM

## 2022-02-21 ENCOUNTER — Ambulatory Visit: Payer: Medicare Other

## 2022-02-21 DIAGNOSIS — M25642 Stiffness of left hand, not elsewhere classified: Secondary | ICD-10-CM

## 2022-02-21 DIAGNOSIS — M25641 Stiffness of right hand, not elsewhere classified: Secondary | ICD-10-CM

## 2022-02-21 DIAGNOSIS — M79641 Pain in right hand: Secondary | ICD-10-CM

## 2022-02-22 NOTE — Therapy (Signed)
OUTPATIENT OCCUPATIONAL THERAPY ORTHO TREATMENT  Patient Name: Ralph Doyle MRN: [id number] DOB:07-Jan-1951, 71 y.o., male Today's Date: 02/22/2022  PCP: Dr. Belinda Fisher REFERRING PROVIDER: Dr. Vernelle Emerald (Rheumatology)   OT End of Session - 02/22/22 0950     Visit Number 2    Number of Visits 12    Date for OT Re-Evaluation 04/03/22    Progress Note Due on Visit 10    OT Start Time 0830    OT Stop Time 0915    OT Time Calculation (min) 45 min    Activity Tolerance Patient tolerated treatment well    Behavior During Therapy [facility] for tasks assessed/performed             Past Medical History:  Diagnosis Date   Cancer ([place])    Thyroid   Hyperlipidemia    Hypertension    Rheumatoid arthritis ([place])    Past Surgical History:  Procedure Laterality Date   COLONOSCOPY WITH PROPOFOL     COLONOSCOPY WITH PROPOFOL N/A 10/08/2021   Procedure: COLONOSCOPY WITH PROPOFOL;  Surgeon: Lucilla Lame, MD;  Location: [facility] ENDOSCOPY;  Service: Endoscopy;  Laterality: N/A;   MANDIBLE SURGERY     THYROIDECTOMY Bilateral 05/16/2020   Procedure: TOTAL THYROIDECTOMY;  Surgeon: Clyde Canterbury, MD;  Location: [facility] ORS;  Service: ENT;  Laterality: Bilateral;   Patient Active Problem List   Diagnosis Date Noted   Neck pain 10/30/2021   Positive colorectal cancer screening using Cologuard test    Polyp of transverse colon    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   Pain in right hand 10/12/2020   Pain of left hand 10/12/2020   Flexor tenosynovitis of finger 08/15/2020   Rheumatoid arthritis with rheumatoid factor of multiple sites without organ or systems involvement ([place]) 06/14/2020   High risk medication use 06/14/2020   Thyroid malignant neoplasm ([place]) 05/16/2020    ONSET DATE: 02/04/22 (date of referral)  REFERRING DIAG: M05.79 (ICD-10-CM) - Rheumatoid arthritis with rheumatoid factor of multiple sites without organ or systems involvement ([place])  THERAPY DIAG:  Stiffness of  hand joint, right  Stiffness of hand joint, left  Bilateral hand pain  Rationale for Evaluation and Treatment Rehabilitation  SUBJECTIVE:   SUBJECTIVE STATEMENT: Pt reports doing well this morning.  PERTINENT HISTORY: Per rheumatology office note on 02/04/22, Ralph Doyle is a 71 y.o. male here for follow up for seropositive RA on MTX 09.9 mg PO weekly folic acid 1 mg daily tried tapering prednisone and trying turmeric supplementation.  Overall symptoms are doing fairly well. He has some pain in is left middle finger hands otherwise about the same as before. He is having pain at the base of the neck more than usual, going for massages these temporarily help. Pain is somewhat worse with looking side to side.  PAIN:  Are you having pain? 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, nutritional   PATIENT GOALS: improve pain and stiffness in hands  OBJECTIVE:   HAND DOMINANCE: Right  FUNCTIONAL OUTCOME MEASURES: FOTO: 69  UPPER EXTREMITY ROM     Active ROM Right eval Left eval  Shoulder flexion 158 155  Shoulder abduction 180 160  Shoulder adduction    Shoulder extension    Shoulder internal rotation WNL WNL  Shoulder external rotation WNL WNL  Elbow flexion    Elbow extension    Wrist flexion 80 75  Wrist extension 55 62  Wrist ulnar deviation    Wrist radial deviation    Wrist pronation    Wrist supination    (Blank rows = not tested)  Active ROM Right eval Left eval  Thumb MCP (0-60)    Thumb IP (0-80)    Thumb Radial abd/add (0-55)     Thumb Palmar abd/add (0-45)     Thumb Opposition to Small Finger     Index MCP (0-90)  -30* ext   Index PIP (0-100)     Index DIP (0-70)      Long MCP (0-90)   -30* ext   Long PIP (0-100)      Long DIP (0-70)      Ring MCP (0-90)      Ring PIP (0-100) -15* ext     Ring DIP (0-70)      Little MCP (0-90)      Little PIP (0-100)       Little DIP (0-70)      (Blank rows = not tested)   TODAY'S TREATMENT:  Self Care: Initiated joint protection education with focus on alternating activity and rest periods, activity modifications (using cart vs basket in the grocery store), carrying ADL supplies (ie groceries/bags) with larger muscle groups, gripping smaller items with the least amount of force necessary, resting before the point of pain or fatigue.  Made recommendations for wearing pt's copper gloves when doing yard work, and educated on built up handles for steering wheels.  Pt reported his copper gloves are old and OT recommended replacement to maximize benefit and compression, and encouraged open fingered gloves for maintaining optimal dexterity while wearing.  Used moist heat this date for pain reduction/muscle relaxation in prep for therapeutic exercises noted below during joint protection education.  Pt tolerated heat well and OT recommended various heat options for home, including rice packs that may go in microwave for heat therapy or freezer for cold therapy.  Therapeutic Exercise: Issued soft blue theraputty and instructed pt in gentle strengthening and coordination exercises for R/L hands, including gross grasping, lateral/2 point/3 point pinching, and digit abd/add.  Able to return demo with intermittent vc for technique to improve quality of movement.  Reinforced importance of low reps and pain free movements for RA considerations.  Encouraged use of putty 3-7 days, 5-10 min for loosing and lubricating joints for increasing flexibility and strength in hands.  Instructed pt in self passive wrist and MCP ext stretch using edge of table, cued for technique and holding 20 secs.  These joints targeted to minimize flexion contractures.  Instructed pt all stretches should be within a comfortable/pain free range.  Instructed pt in tendon glides for each hand, completing 3 reps on each hand, mod verbal and tactile cues for  technique.  Handouts issued for putty and tendon gliding exercises.      PATIENT EDUCATION: Education details: Hydrologist, HEP Person educated: Patient Education method: Explanation and Verbal cues Education comprehension: verbalized understanding   HOME EXERCISE PROGRAM: Theraputty, tendon gliding, wrist and MCP ext stretching  GOALS: Goals reviewed with patient? Yes  SHORT TERM GOALS: Target date: 03/13/22    Pt will be indep with HEP for improving ROM in BUEs Baseline: HEP not yet initiated Goal status: INITIAL   LONG TERM GOALS: Target date: 04/02/22    Pt will increase FOTO score to 70 or greater to indicate pt perceived improvement in functional performance with daily tasks. Baseline: 69 Goal status: INITIAL  2.  Pt will verbalize at least 3 joint protection strategies for long term maintenance of RA symptoms. Baseline: Education not yet initiated Goal status: INITIAL  3.  Pt will verbalize at least 2 pain management strategies to manage pain in BUEs for better tolerance to daily tasks. Baseline: Education not yet initiated Goal status: INITIAL  4.  Pt will be indep to verbalize/demo splinting regimen with ring splints (ie: schedule/precautions/donning/doffing) for minimizing flexion contractures in R RF and L LF. Baseline: Splints not yet obtained Goal status: INITIAL   ASSESSMENT:  CLINICAL IMPRESSION: Joint protection education initiated this date and education on benefits of heat/cold/compression/gentle stretch for maintaining joint ROM and minimizing pain in bilat hands.  Pt tolerated all exercises well this date, including tendon glides, soft theraputty, and gentle passive wrist and MCP extension stretching, with pt verbalizing understanding that low reps and pain free exercise are important activity considerations for RA.  Pt will benefit from skilled OT for continued HEP instruction for increasing BUE ROM, education on pain management, continued joint  protection, and activity modification strategies, and possible splinting to reduce flexion contractures in bilat hands (may benefit from ring splints).    PERFORMANCE DEFICITS in functional skills including ADLs, IADLs, sensation, ROM, pain, flexibility, body mechanics, decreased knowledge of precautions, decreased knowledge of use of DME, and UE functional use.  IMPAIRMENTS are limiting patient from ADLs, IADLs, work, and leisure.   COMORBIDITIES may have co-morbidities  that affects occupational performance. Patient will benefit from skilled OT to address above impairments and improve overall function.  MODIFICATION OR ASSISTANCE TO COMPLETE EVALUATION: No modification of tasks or assist necessary to complete an evaluation.  OT OCCUPATIONAL PROFILE AND HISTORY: Problem focused assessment: Including review of records relating to presenting problem.  CLINICAL DECISION MAKING: LOW - limited treatment options, no task modification necessary  REHAB POTENTIAL: Good  EVALUATION COMPLEXITY: Low      PLAN: OT FREQUENCY: 2x/week  OT DURATION: 6 weeks  PLANNED INTERVENTIONS: self care/ADL training, therapeutic exercise, therapeutic activity, manual therapy, passive range of motion, splinting, paraffin, moist heat, patient/family education, and DME and/or AE instructions  RECOMMENDED OTHER SERVICES: N/A  CONSULTED AND AGREED WITH PLAN OF CARE: Patient  PLAN FOR NEXT SESSION: HEP progression, joint protection strategies, pain management strategies, paraffin   Leta Speller, MS, OTR/L  Darleene Cleaver, OT 02/22/2022, 9:51 AM

## 2022-02-26 ENCOUNTER — Ambulatory Visit: Payer: Medicare Other

## 2022-02-26 DIAGNOSIS — M79642 Pain in left hand: Secondary | ICD-10-CM

## 2022-02-26 DIAGNOSIS — M79641 Pain in right hand: Secondary | ICD-10-CM | POA: Diagnosis not present

## 2022-02-26 DIAGNOSIS — M25642 Stiffness of left hand, not elsewhere classified: Secondary | ICD-10-CM

## 2022-02-26 DIAGNOSIS — M25641 Stiffness of right hand, not elsewhere classified: Secondary | ICD-10-CM

## 2022-02-26 NOTE — Therapy (Signed)
OUTPATIENT OCCUPATIONAL THERAPY ORTHO TREATMENT  Patient Name: Ralph Doyle MRN: [id number] DOB:1950-12-27, 71 y.o., male Today's Date: 02/26/2022  PCP: Dr. Belinda Fisher REFERRING PROVIDER: Dr. Vernelle Emerald (Rheumatology)     Past Medical History:  Diagnosis Date   Cancer [facility])    Thyroid   Hyperlipidemia    Hypertension    Rheumatoid arthritis [facility])    Past Surgical History:  Procedure Laterality Date   COLONOSCOPY WITH PROPOFOL     COLONOSCOPY WITH PROPOFOL N/A 10/08/2021   Procedure: COLONOSCOPY WITH PROPOFOL;  Surgeon: Lucilla Lame, MD;  Location: [facility] ENDOSCOPY;  Service: Endoscopy;  Laterality: N/A;   MANDIBLE SURGERY     THYROIDECTOMY Bilateral 05/16/2020   Procedure: TOTAL THYROIDECTOMY;  Surgeon: Clyde Canterbury, MD;  Location: [facility] ORS;  Service: ENT;  Laterality: Bilateral;   Patient Active Problem List   Diagnosis Date Noted   Neck pain 10/30/2021   Positive colorectal cancer screening using Cologuard test    Polyp of transverse colon    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  Rationale for Evaluation and Treatment Rehabilitation  SUBJECTIVE:   SUBJECTIVE STATEMENT: Pt reports his hands were a little sore after working with the putty, but not bad.  OT reminded pt to perform HEP with low reps.  Will plan to issue a softer putty to enable pain free exercises.   PERTINENT HISTORY: Per rheumatology office note on 02/04/22,  Ralph Doyle is a 71 y.o. male here for follow up for seropositive RA on MTX 56.8 mg PO weekly folic acid 1 mg daily tried tapering prednisone and trying turmeric supplementation.  Overall symptoms are doing fairly well. He has some pain in is left middle finger hands otherwise about the same as before. He is having pain at the base of the neck more than usual, going for massages these temporarily help. Pain is somewhat worse with looking side to side.  PAIN:  Are you having pain? 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, nutritional   PATIENT GOALS: improve pain and stiffness in hands  OBJECTIVE:   HAND DOMINANCE: Right  FUNCTIONAL OUTCOME MEASURES: FOTO: 69  UPPER EXTREMITY ROM     Active ROM Right eval Left eval  Shoulder flexion 158 155  Shoulder abduction 180 160  Shoulder adduction    Shoulder extension    Shoulder internal rotation WNL WNL  Shoulder external rotation WNL WNL  Elbow flexion    Elbow extension    Wrist flexion 80 75  Wrist extension 55 62  Wrist ulnar deviation    Wrist radial deviation    Wrist pronation    Wrist supination    (Blank rows = not tested)  Active ROM Right eval Left eval  Thumb MCP (0-60)    Thumb IP (0-80)    Thumb Radial abd/add (0-55)     Thumb Palmar abd/add (0-45)  Thumb Opposition to Small Finger     Index MCP (0-90)  -30* ext   Index PIP (0-100)     Index DIP (0-70)      Long MCP (0-90)   -30* ext   Long PIP (0-100)      Long DIP (0-70)      Ring MCP (0-90)      Ring PIP (0-100) -15* ext     Ring DIP (0-70)      Little MCP (0-90)      Little PIP (0-100)      Little DIP (0-70)      (Blank rows = not tested)   TODAY'S TREATMENT:    Paraffin tx: Bilat hands x10 min for pain reduction, muscle relaxation in prep for therapeutic exercises    Manual Therapy: Soft tissue massage surrounding R RF PIP joint, MCPs of  all digits both hands to promote circulation for pain reduction and reducing joint inflammation.  Soft tissue massage completed at the flexor tendon of the R RF, working to increase tissue elasticity for increasing ROM.  Instructed pt in self massage techniques and encouraged pt or spouse perform to manage pain and swelling as needed.  Therapeutic Exercise: Reviewed HEP with primary focus on keeping all exercises within a pain free range and avoid high reps.  Performed gentle, prolonged passive stretching for R RF PIP extension and MCP extension on all digits.  Reviewed self PROM techniques with good return demo.  Ortho fit training: Assisted pt to don a soft stretch extension splint for the R RF PIP flexion contracture.  Size medium was tried, though this was too narrow and will plan to order a large.  Advised pt of general splint wearing guidelines, including wear should be comfortable, feeling a gentle stretch and no pain, with goal to gradually work up to 2 hours on at a time.  Pt verbalized understanding.  Further training needed.    PATIENT EDUCATION: Education details: HEP, soft tissue massage, self passive stretching, soft stretch ext splint Person educated: Patient Education method: Explanation and Verbal cues Education comprehension: verbalized understanding   HOME EXERCISE PROGRAM: Theraputty, tendon gliding, wrist and MCP ext stretching  GOALS: Goals reviewed with patient? Yes  SHORT TERM GOALS: Target date: 03/13/22    Pt will be indep with HEP for improving ROM in BUEs Baseline: HEP not yet initiated Goal status: INITIAL   LONG TERM GOALS: Target date: 04/02/22    Pt will increase FOTO score to 70 or greater to indicate pt perceived improvement in functional performance with daily tasks. Baseline: 69 Goal status: INITIAL  2.  Pt will verbalize at least 3 joint protection strategies for long term maintenance of RA symptoms. Baseline: Education not yet initiated Goal  status: INITIAL  3.  Pt will verbalize at least 2 pain management strategies to manage pain in BUEs for better tolerance to daily tasks. Baseline: Education not yet initiated Goal status: INITIAL  4.  Pt will be indep to verbalize/demo splinting regimen with ring splints (ie: schedule/precautions/donning/doffing) for minimizing flexion contractures in R RF and L LF. Baseline: Splints not yet obtained Goal status: INITIAL   ASSESSMENT:  CLINICAL IMPRESSION: Good tolerance to paraffin, soft tissue massage, and passive stretching this date.  Reviewed importance of low reps/pain free exercises with putty.  Pt reported a little bit of pain in the hands, despite performing low reps, so will plan to issue a lighter resistant putty when available.  Assisted pt to don a soft  stretch extension splint for the R RF PIP flexion contracture.  Size medium was tried, though this was too narrow and will plan to order a large.  Advised pt of general splint wearing guidelines, including wear should be comfortable, feeling a gentle stretch and no pain, with goal to gradually work up to 2 hours on at a time.  Pt verbalized understanding.  Further training needed.  Pt will benefit from skilled OT for continued HEP instruction for increasing BUE ROM, education on pain management, continued joint protection, and activity modification strategies, and possible splinting to reduce flexion contractures in bilat hands (may benefit from ring splints).    PERFORMANCE DEFICITS in functional skills including ADLs, IADLs, sensation, ROM, pain, flexibility, body mechanics, decreased knowledge of precautions, decreased knowledge of use of DME, and UE functional use.  IMPAIRMENTS are limiting patient from ADLs, IADLs, work, and leisure.   COMORBIDITIES may have co-morbidities  that affects occupational performance. Patient will benefit from skilled OT to address above impairments and improve overall function.  MODIFICATION OR  ASSISTANCE TO COMPLETE EVALUATION: No modification of tasks or assist necessary to complete an evaluation.  OT OCCUPATIONAL PROFILE AND HISTORY: Problem focused assessment: Including review of records relating to presenting problem.  CLINICAL DECISION MAKING: LOW - limited treatment options, no task modification necessary  REHAB POTENTIAL: Good  EVALUATION COMPLEXITY: Low      PLAN: OT FREQUENCY: 2x/week  OT DURATION: 6 weeks  PLANNED INTERVENTIONS: self care/ADL training, therapeutic exercise, therapeutic activity, manual therapy, passive range of motion, splinting, paraffin, moist heat, patient/family education, and DME and/or AE instructions  RECOMMENDED OTHER SERVICES: N/A  CONSULTED AND AGREED WITH PLAN OF CARE: Patient  PLAN FOR NEXT SESSION: HEP progression, joint protection strategies, pain management strategies, paraffin   Leta Speller, MS, OTR/L  Darleene Cleaver, OT 02/26/2022, 9:39 AM

## 2022-02-28 ENCOUNTER — Ambulatory Visit: Payer: Medicare Other | Attending: Internal Medicine

## 2022-02-28 DIAGNOSIS — M79642 Pain in left hand: Secondary | ICD-10-CM | POA: Insufficient documentation

## 2022-02-28 DIAGNOSIS — M79641 Pain in right hand: Secondary | ICD-10-CM | POA: Diagnosis present

## 2022-02-28 DIAGNOSIS — M25641 Stiffness of right hand, not elsewhere classified: Secondary | ICD-10-CM | POA: Insufficient documentation

## 2022-02-28 DIAGNOSIS — M25642 Stiffness of left hand, not elsewhere classified: Secondary | ICD-10-CM | POA: Diagnosis present

## 2022-02-28 NOTE — Therapy (Signed)
OUTPATIENT OCCUPATIONAL THERAPY ORTHO TREATMENT  Patient Name: Ralph Doyle MRN: [id number] DOB:09-15-1950, 71 y.o., male Today's Date: 02/28/2022  PCP: Dr. Belinda Fisher REFERRING PROVIDER: Dr. Vernelle Emerald (Rheumatology)   OT End of Session - 02/28/22 0928     Visit Number 4    Number of Visits 12    Date for OT Re-Evaluation 04/03/22    Progress Note Due on Visit 10    OT Start Time 0831    OT Stop Time 0916    OT Time Calculation (min) 45 min    Activity Tolerance Patient tolerated treatment well    Behavior During Therapy [facility] for tasks assessed/performed              Past Medical History:  Diagnosis Date   Cancer ([place])    Thyroid   Hyperlipidemia    Hypertension    Rheumatoid arthritis ([place])    Past Surgical History:  Procedure Laterality Date   COLONOSCOPY WITH PROPOFOL     COLONOSCOPY WITH PROPOFOL N/A 10/08/2021   Procedure: COLONOSCOPY WITH PROPOFOL;  Surgeon: Lucilla Lame, MD;  Location: [facility] ENDOSCOPY;  Service: Endoscopy;  Laterality: N/A;   MANDIBLE SURGERY     THYROIDECTOMY Bilateral 05/16/2020   Procedure: TOTAL THYROIDECTOMY;  Surgeon: Clyde Canterbury, MD;  Location: [facility] ORS;  Service: ENT;  Laterality: Bilateral;   Patient Active Problem List   Diagnosis Date Noted   Neck pain 10/30/2021   Positive colorectal cancer screening using Cologuard test    Polyp of transverse colon    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)  THERAPY DIAG:  Stiffness of  hand joint, right  Stiffness of hand joint, left  Bilateral hand pain  Rationale for Evaluation and Treatment Rehabilitation  SUBJECTIVE:   SUBJECTIVE STATEMENT: Pt reports that his hands are a little more sore today but stated that he played his bass for quite awhile yesterday.     PERTINENT HISTORY: Per rheumatology office note on 02/04/22, [name] is a 71 y.o. male here for follow up for seropositive RA on MTX 62.7 mg PO weekly folic acid 1 mg daily tried tapering prednisone and trying turmeric supplementation.  Overall symptoms are doing fairly well. He has some pain in is left middle finger hands otherwise about the same as before. He is having pain at the base of the neck more than usual, going for massages these temporarily help. Pain is somewhat worse with looking side to side.  PAIN:  Are you having pain? 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, nutritional   PATIENT GOALS: improve pain and stiffness in hands  OBJECTIVE:   HAND DOMINANCE: Right  FUNCTIONAL OUTCOME MEASURES: FOTO: 69  UPPER EXTREMITY ROM     Active ROM Right eval Left eval  Shoulder flexion 158 155  Shoulder abduction 180 160  Shoulder adduction    Shoulder extension    Shoulder internal rotation WNL WNL  Shoulder  external rotation WNL WNL  Elbow flexion    Elbow extension    Wrist flexion 80 75  Wrist extension 55 62  Wrist ulnar deviation    Wrist radial deviation    Wrist pronation    Wrist supination    (Blank rows = not tested)  Active ROM Right eval Left eval  Thumb MCP (0-60)    Thumb IP (0-80)    Thumb Radial abd/add (0-55)     Thumb Palmar abd/add (0-45)     Thumb Opposition to Small Finger     Index MCP (0-90)  -30* ext   Index PIP (0-100)     Index DIP (0-70)      Long MCP (0-90)   -30* ext   Long PIP (0-100)      Long DIP (0-70)      Ring MCP (0-90)      Ring  PIP (0-100) -15* ext     Ring DIP (0-70)      Little MCP (0-90)      Little PIP (0-100)      Little DIP (0-70)      (Blank rows = not tested)   TODAY'S TREATMENT:    Moist heat: Bilat hands x5 min for pain reduction, muscle relaxation in prep for therapeutic exercises    Manual Therapy: Soft tissue massage surrounding R RF PIP joint, MCPs of all digits both hands to promote circulation for pain reduction and reducing joint inflammation.  Soft tissue massage completed at the flexor tendon of the R RF, and entire palmar surface of the L hand to target tight flexor tendons, working to increase tissue elasticity for increasing ROM.   Therapeutic Exercise:  Issued soft, white theraputty for down grading resistance as pt reported some pain with soft, blue putty.  Reminded pt to perform low reps.  Performed gentle, prolonged passive stretching for R RF PIP extension and MCP extension on all digits each hand.  Instructed pt in cane stretches for increasing bilat shoulder mobility d/t stiffness and pain.  Issued handout and completed 1 set 5-7 reps for bilat shoulder flex, ER to top of head, shoulder abd, shoulder IR and ext behind back.  OT provided mod vc for posture and form for each exercise.  Pt able to follow handout with min verbal and written cues.  Encouraged completion of cane exercises with low reps, pain free motion, several days per week.  Pt verbalized understanding.   PATIENT EDUCATION: Education details: HEP, soft tissue massage, self passive stretching, soft stretch ext splint Person educated: Patient Education method: Explanation and Verbal cues Education comprehension: verbalized understanding   HOME EXERCISE PROGRAM: Theraputty, tendon gliding, wrist and MCP ext stretching  GOALS: Goals reviewed with patient? Yes  SHORT TERM GOALS: Target date: 03/13/22    Pt will be indep with HEP for improving ROM in BUEs Baseline: HEP not yet initiated Goal status: INITIAL   LONG  TERM GOALS: Target date: 04/02/22    Pt will increase FOTO score to 70 or greater to indicate pt perceived improvement in functional performance with daily tasks. Baseline: 69 Goal status: INITIAL  2.  Pt will verbalize at least 3 joint protection strategies for long term maintenance of RA symptoms. Baseline: Education not yet initiated Goal status: INITIAL  3.  Pt will verbalize at least 2 pain management strategies to manage pain in BUEs for better tolerance to daily tasks. Baseline: Education not yet initiated Goal status: INITIAL  4.  Pt will be indep  to verbalize/demo splinting regimen with ring splints (ie: schedule/precautions/donning/doffing) for minimizing flexion contractures in R RF and L LF. Baseline: Splints not yet obtained Goal status: INITIAL   ASSESSMENT:  CLINICAL IMPRESSION: Good tolerance to moist heat, soft tissue massage, passive stretching, and cane stretches for shoulder mobility this date.  OT issued XL soft stretch ext splint for R RF PIP joint; good fit.  Pt tolerated splint well during session (~15 min).  OT encouraged pt don for no more than 30 min but can don 2-3 times during the day, working up to increased time as tolerated.  OT instructed to cut down on time or frequency of donning if any pain develops, as splint should be comfortable with a light stretch.  Pt verbalized understanding.  OT continued to reinforce joint protection strategies, allowing frequent rest breaks with activity, especially when playing his bass.  Flexor tendons in each hand will continue to benefit from stretch and soft tissue massage for reducing joint contractures and pain.  Pt will benefit from skilled OT for continued HEP instruction for increasing BUE ROM, education on pain management, continued joint protection, activity modification strategies, and splint monitoring to reduce flexion contractures in bilat hands.    PERFORMANCE DEFICITS in functional skills including ADLs, IADLs,  sensation, ROM, pain, flexibility, body mechanics, decreased knowledge of precautions, decreased knowledge of use of DME, and UE functional use.  IMPAIRMENTS are limiting patient from ADLs, IADLs, work, and leisure.   COMORBIDITIES may have co-morbidities  that affects occupational performance. Patient will benefit from skilled OT to address above impairments and improve overall function.  MODIFICATION OR ASSISTANCE TO COMPLETE EVALUATION: No modification of tasks or assist necessary to complete an evaluation.  OT OCCUPATIONAL PROFILE AND HISTORY: Problem focused assessment: Including review of records relating to presenting problem.  CLINICAL DECISION MAKING: LOW - limited treatment options, no task modification necessary  REHAB POTENTIAL: Good  EVALUATION COMPLEXITY: Low      PLAN: OT FREQUENCY: 2x/week  OT DURATION: 6 weeks  PLANNED INTERVENTIONS: self care/ADL training, therapeutic exercise, therapeutic activity, manual therapy, passive range of motion, splinting, paraffin, moist heat, patient/family education, and DME and/or AE instructions  RECOMMENDED OTHER SERVICES: N/A  CONSULTED AND AGREED WITH PLAN OF CARE: Patient  PLAN FOR NEXT SESSION: HEP progression, joint protection strategies, pain management strategies, paraffin   Leta Speller, MS, OTR/L  Darleene Cleaver, OT 02/28/2022, 9:31 AM

## 2022-03-05 ENCOUNTER — Ambulatory Visit: Payer: Medicare Other

## 2022-03-05 DIAGNOSIS — M25641 Stiffness of right hand, not elsewhere classified: Secondary | ICD-10-CM

## 2022-03-05 DIAGNOSIS — M25642 Stiffness of left hand, not elsewhere classified: Secondary | ICD-10-CM

## 2022-03-05 DIAGNOSIS — M79641 Pain in right hand: Secondary | ICD-10-CM

## 2022-03-06 NOTE — Therapy (Signed)
OUTPATIENT OCCUPATIONAL THERAPY ORTHO TREATMENT  Patient Name: Ralph Doyle MRN: [id number] DOB:1950-07-13, 71 y.o., male Today's Date: 03/06/2022  PCP: Dr. Belinda Fisher REFERRING PROVIDER: Dr. Vernelle Emerald (Rheumatology)   OT End of Session - 03/06/22 0953     Visit Number 5    Number of Visits 12    Date for OT Re-Evaluation 04/03/22    Progress Note Due on Visit 10    OT Start Time 1346    OT Stop Time 1431    OT Time Calculation (min) 45 min    Activity Tolerance Patient tolerated treatment well    Behavior During Therapy [facility] for tasks assessed/performed              Past Medical History:  Diagnosis Date   Cancer ([place])    Thyroid   Hyperlipidemia    Hypertension    Rheumatoid arthritis ([place])    Past Surgical History:  Procedure Laterality Date   COLONOSCOPY WITH PROPOFOL     COLONOSCOPY WITH PROPOFOL N/A 10/08/2021   Procedure: COLONOSCOPY WITH PROPOFOL;  Surgeon: Lucilla Lame, MD;  Location: [facility] ENDOSCOPY;  Service: Endoscopy;  Laterality: N/A;   MANDIBLE SURGERY     THYROIDECTOMY Bilateral 05/16/2020   Procedure: TOTAL THYROIDECTOMY;  Surgeon: Clyde Canterbury, MD;  Location: [facility] ORS;  Service: ENT;  Laterality: Bilateral;   Patient Active Problem List   Diagnosis Date Noted   Neck pain 10/30/2021   Positive colorectal cancer screening using Cologuard test    Polyp of transverse colon    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   Pain in right hand 10/12/2020   Pain of left hand 10/12/2020   Flexor tenosynovitis of finger 08/15/2020   Rheumatoid arthritis with rheumatoid factor of multiple sites without organ or systems involvement ([place]) 06/14/2020   High risk medication use 06/14/2020   Thyroid malignant neoplasm ([place]) 05/16/2020    ONSET DATE: 02/04/22 (date of referral)  REFERRING DIAG: M05.79 (ICD-10-CM) - Rheumatoid arthritis with rheumatoid factor of multiple sites without organ or systems involvement ([place])  THERAPY DIAG:  Bilateral hand  pain  Stiffness of hand joint, right  Stiffness of hand joint, left  Rationale for Evaluation and Treatment Rehabilitation  SUBJECTIVE:   SUBJECTIVE STATEMENT: "I feel like my finger is already looking a little straighter."     PERTINENT HISTORY: Per rheumatology office note on 02/04/22, Ralph Doyle is a 71 y.o. male here for follow up for seropositive RA on MTX 29.5 mg PO weekly folic acid 1 mg daily tried tapering prednisone and trying turmeric supplementation.  Overall symptoms are doing fairly well. He has some pain in is left middle finger hands otherwise about the same as before. He is having pain at the base of the neck more than usual, going for massages these temporarily help. Pain is somewhat worse with looking side to side.  PAIN:  Are you having pain? 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, nutritional   PATIENT GOALS: improve pain and stiffness in hands  OBJECTIVE:   HAND DOMINANCE: Right  FUNCTIONAL OUTCOME MEASURES: FOTO: 69  UPPER EXTREMITY ROM     Active ROM Right eval Left eval  Shoulder flexion 158 155  Shoulder abduction 180 160  Shoulder adduction    Shoulder extension    Shoulder internal rotation WNL WNL  Shoulder external rotation WNL WNL  Elbow flexion    Elbow  extension    Wrist flexion 80 75  Wrist extension 55 62  Wrist ulnar deviation    Wrist radial deviation    Wrist pronation    Wrist supination    (Blank rows = not tested)  Active ROM Right eval Left eval  Thumb MCP (0-60)    Thumb IP (0-80)    Thumb Radial abd/add (0-55)     Thumb Palmar abd/add (0-45)     Thumb Opposition to Small Finger     Index MCP (0-90)  -30* ext   Index PIP (0-100)     Index DIP (0-70)      Long MCP (0-90)   -30* ext   Long PIP (0-100)      Long DIP (0-70)      Ring MCP (0-90)      Ring PIP (0-100) -15* ext     Ring DIP (0-70)      Little MCP  (0-90)      Little PIP (0-100)      Little DIP (0-70)      (Blank rows = not tested)   TODAY'S TREATMENT:    Moist heat: Bilat hands x5 min for pain reduction, muscle relaxation in prep for therapeutic exercises    Manual Therapy: Soft tissue massage surrounding R RF PIP joint, MCPs of all digits both hands to promote circulation for pain reduction and reducing joint inflammation.  Soft tissue massage completed at the flexor tendon of the R RF, and entire palmar surface of the L and R hand to target tight flexor tendons, working to increase tissue elasticity for increasing ROM.   Therapeutic Exercise:  Performed gentle, prolonged passive stretching for R RF PIP extension and MCP extension on all digits each hand.  Reviewed cane stretches for increasing bilat shoulder mobility d/t stiffness and pain.  OT provided min vc for posture and form for each exercise.    PATIENT EDUCATION: Education details: HEP, soft tissue massage, self passive stretching, soft stretch ext splint Person educated: Patient Education method: Explanation and Verbal cues Education comprehension: verbalized understanding   HOME EXERCISE PROGRAM: Theraputty, tendon gliding, wrist and MCP ext stretching  GOALS: Goals reviewed with patient? Yes  SHORT TERM GOALS: Target date: 03/13/22    Pt will be indep with HEP for improving ROM in BUEs Baseline: HEP not yet initiated Goal status: INITIAL   LONG TERM GOALS: Target date: 04/02/22    Pt will increase FOTO score to 70 or greater to indicate pt perceived improvement in functional performance with daily tasks. Baseline: 69 Goal status: INITIAL  2.  Pt will verbalize at least 3 joint protection strategies for long term maintenance of RA symptoms. Baseline: Education not yet initiated Goal status: INITIAL  3.  Pt will verbalize at least 2 pain management strategies to manage pain in BUEs for better tolerance to daily tasks. Baseline: Education not yet  initiated Goal status: INITIAL  4.  Pt will be indep to verbalize/demo splinting regimen with ring splints (ie: schedule/precautions/donning/doffing) for minimizing flexion contractures in R RF and L LF. Baseline: Splints not yet obtained Goal status: INITIAL   ASSESSMENT:  CLINICAL IMPRESSION: Good tolerance to moist heat, soft tissue massage, passive stretching, and cane stretches for shoulder mobility this date.  Pt reports good tolerance of soft extension splint and reports consistent use for short 30 min periods during the day.  Pt reports he feels like his R RF already looks a little straighter.  OT measured and noted improvement in  3* of active R RF PIP extension as compared to before splint use.  Pt will benefit from skilled OT for continued HEP instruction for increasing BUE ROM, education on pain management, continued joint protection, activity modification strategies, and splint monitoring to reduce flexion contractures in bilat hands.    PERFORMANCE DEFICITS in functional skills including ADLs, IADLs, sensation, ROM, pain, flexibility, body mechanics, decreased knowledge of precautions, decreased knowledge of use of DME, and UE functional use.  IMPAIRMENTS are limiting patient from ADLs, IADLs, work, and leisure.   COMORBIDITIES may have co-morbidities  that affects occupational performance. Patient will benefit from skilled OT to address above impairments and improve overall function.  MODIFICATION OR ASSISTANCE TO COMPLETE EVALUATION: No modification of tasks or assist necessary to complete an evaluation.  OT OCCUPATIONAL PROFILE AND HISTORY: Problem focused assessment: Including review of records relating to presenting problem.  CLINICAL DECISION MAKING: LOW - limited treatment options, no task modification necessary  REHAB POTENTIAL: Good  EVALUATION COMPLEXITY: Low      PLAN: OT FREQUENCY: 2x/week  OT DURATION: 6 weeks  PLANNED INTERVENTIONS: self care/ADL  training, therapeutic exercise, therapeutic activity, manual therapy, passive range of motion, splinting, paraffin, moist heat, patient/family education, and DME and/or AE instructions  RECOMMENDED OTHER SERVICES: N/A  CONSULTED AND AGREED WITH PLAN OF CARE: Patient  PLAN FOR NEXT SESSION: HEP progression, joint protection strategies, pain management strategies, paraffin   Leta Speller, MS, OTR/L  Darleene Cleaver, OT 03/06/2022, 9:55 AM

## 2022-03-07 ENCOUNTER — Ambulatory Visit: Payer: Medicare Other

## 2022-03-07 DIAGNOSIS — M25641 Stiffness of right hand, not elsewhere classified: Secondary | ICD-10-CM

## 2022-03-07 DIAGNOSIS — M79641 Pain in right hand: Secondary | ICD-10-CM

## 2022-03-07 DIAGNOSIS — M25642 Stiffness of left hand, not elsewhere classified: Secondary | ICD-10-CM

## 2022-03-07 NOTE — Therapy (Signed)
OUTPATIENT OCCUPATIONAL THERAPY ORTHO TREATMENT  Patient Name: Ralph Doyle MRN: [id number] DOB:[birth year], 71 y.o., male Today's Date: 03/07/2022  PCP: Dr. Belinda Fisher REFERRING PROVIDER: Dr. Vernelle Emerald (Rheumatology)   OT End of Session - 03/07/22 2043     Visit Number 6    Number of Visits 12    Date for OT Re-Evaluation 04/03/22    Progress Note Due on Visit 10    OT Start Time 0833    OT Stop Time 0916    OT Time Calculation (min) 43 min    Activity Tolerance Patient tolerated treatment well    Behavior During Therapy [facility] for tasks assessed/performed               Past Medical History:  Diagnosis Date   Cancer ([place])    Thyroid   Hyperlipidemia    Hypertension    Rheumatoid arthritis ([place])    Past Surgical History:  Procedure Laterality Date   COLONOSCOPY WITH PROPOFOL     COLONOSCOPY WITH PROPOFOL N/A 10/08/2021   Procedure: COLONOSCOPY WITH PROPOFOL;  Surgeon: Lucilla Lame, MD;  Location: [facility] ENDOSCOPY;  Service: Endoscopy;  Laterality: N/A;   MANDIBLE SURGERY     THYROIDECTOMY Bilateral 05/16/2020   Procedure: TOTAL THYROIDECTOMY;  Surgeon: Clyde Canterbury, MD;  Location: [facility] ORS;  Service: ENT;  Laterality: Bilateral;   Patient Active Problem List   Diagnosis Date Noted   Neck pain 10/30/2021   Positive colorectal cancer screening using Cologuard test    Polyp of transverse colon    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   Pain in right hand 10/12/2020   Pain of left hand 10/12/2020   Flexor tenosynovitis of finger 08/15/2020   Rheumatoid arthritis with rheumatoid factor of multiple sites without organ or systems involvement ([place]) 06/14/2020   High risk medication use 06/14/2020   Thyroid malignant neoplasm ([place]) 05/16/2020    ONSET DATE: 02/04/22 (date of referral)  REFERRING DIAG: M05.79 (ICD-10-CM) - Rheumatoid arthritis with rheumatoid factor of multiple sites without organ or systems involvement ([place])  THERAPY DIAG:  Bilateral  hand pain  Stiffness of hand joint, right  Stiffness of hand joint, left  Rationale for Evaluation and Treatment Rehabilitation  SUBJECTIVE:   SUBJECTIVE STATEMENT: "I was able to play golf the other day.  My hands didn't hurt anymore than they normally do."   PERTINENT HISTORY: Per rheumatology office note on 02/04/22, Ralph Doyle is a 71 y.o. male here for follow up for seropositive RA on MTX 95.1 mg PO weekly folic acid 1 mg daily tried tapering prednisone and trying turmeric supplementation.  Overall symptoms are doing fairly well. He has some pain in is left middle finger hands otherwise about the same as before. He is having pain at the base of the neck more than usual, going for massages these temporarily help. Pain is somewhat worse with looking side to side.  PAIN:  Are you having pain? 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, nutritional   PATIENT GOALS: improve pain and stiffness in hands  OBJECTIVE:   HAND DOMINANCE: Right  FUNCTIONAL OUTCOME MEASURES: FOTO: 69  UPPER EXTREMITY ROM     Active ROM Right eval Left eval  Shoulder flexion 158 155  Shoulder abduction 180 160  Shoulder adduction    Shoulder extension    Shoulder internal rotation WNL WNL  Shoulder external rotation WNL WNL  Elbow flexion    Elbow extension    Wrist flexion 80 75  Wrist extension 55 62  Wrist ulnar deviation    Wrist radial deviation    Wrist pronation    Wrist supination    (Blank rows = not tested)  Active ROM Right eval Left eval  Thumb MCP (0-60)    Thumb IP (0-80)    Thumb Radial abd/add (0-55)     Thumb Palmar abd/add (0-45)     Thumb Opposition to Small Finger     Index MCP (0-90)  -30* ext   Index PIP (0-100)     Index DIP (0-70)      Long MCP (0-90)   -30* ext   Long PIP (0-100)      Long DIP (0-70)      Ring MCP (0-90)      Ring PIP (0-100) -15* ext      Ring DIP (0-70)      Little MCP (0-90)      Little PIP (0-100)      Little DIP (0-70)      (Blank rows = not tested)   TODAY'S TREATMENT:    Moist heat: Bilat hands x5 min for pain reduction, muscle relaxation in prep for therapeutic exercises    Manual Therapy: Soft tissue massage surrounding R RF PIP joint, DIP and PIP joints of R/L IF, MCPs of all digits both hands to promote circulation for pain reduction and reducing joint inflammation.  Soft tissue massage completed at the flexor tendon of the R RF, and entire palmar surface of the L and R hand to target tight flexor tendons, working to increase tissue elasticity for increasing ROM.   Therapeutic Exercise:  Performed gentle, prolonged passive stretching for R RF PIP extension and MCP extension on all digits each hand.  Reviewed importance of ongoing daily stretches for the wrists and hands, and reviewed pain management options for R/L IF when playing guitar.  Reviewed importance of frequent rest, stretch, benefits of heat, benefits of soft tissue massage and techniques, and possibility of trying a digit sleeve for mild compression as pt states that his compression gloves are a little limiting when he plays his bass.  Pt was receptive to trying a digit sleeve and offered options to obtain.    PATIENT EDUCATION: Education details: HEP, soft tissue massage, self passive stretching, soft stretch ext splint, digit sleeves Person educated: Patient Education method: Explanation and Verbal cues Education comprehension: verbalized understanding   HOME EXERCISE PROGRAM: Theraputty, tendon gliding, wrist and MCP ext stretching  GOALS: Goals reviewed with patient? Yes  SHORT TERM GOALS: Target date: 03/13/22    Pt will be indep with HEP for improving ROM in BUEs Baseline: HEP not yet initiated Goal status: INITIAL   LONG TERM GOALS: Target date: 04/02/22    Pt will increase FOTO score to 70 or greater to indicate pt perceived  improvement in functional performance with daily tasks. Baseline: 69 Goal status: INITIAL  2.  Pt will verbalize at least 3 joint protection strategies for long term maintenance of RA symptoms. Baseline: Education not yet initiated Goal status: INITIAL  3.  Pt will verbalize at least 2 pain management strategies to manage pain in BUEs for better tolerance to daily tasks. Baseline: Education not yet initiated Goal status: INITIAL  4.  Pt will be indep to verbalize/demo splinting regimen with ring splints (ie: schedule/precautions/donning/doffing) for minimizing flexion contractures in R RF and L LF. Baseline: Splints not yet  obtained Goal status: INITIAL   ASSESSMENT:  CLINICAL IMPRESSION: Good tolerance to moist heat, soft tissue massage, passive stretching.  Pt continues to report good tolerance of soft extension splint and reports consistent use for short 30 min periods during the day.  Reviewed importance of ongoing daily stretches for the wrists and hands, and reviewed pain management options for R/L IF when playing guitar d/t pt's reported concern with these digits.  Reviewed importance of frequent rest, stretch, benefits of heat, benefits of soft tissue massage and techniques, and possibility of trying a digit sleeve for mild compression as pt states that his compression gloves are a little limiting when he plays his bass.  Pt was receptive to trying a digit sleeve and offered options to obtain.   Pt will benefit from skilled OT for continued HEP instruction for increasing BUE ROM, education on pain management, continued joint protection, activity modification strategies, and splint monitoring to reduce flexion contractures in bilat hands.    PERFORMANCE DEFICITS in functional skills including ADLs, IADLs, sensation, ROM, pain, flexibility, body mechanics, decreased knowledge of precautions, decreased knowledge of use of DME, and UE functional use.  IMPAIRMENTS are limiting patient from  ADLs, IADLs, work, and leisure.   COMORBIDITIES may have co-morbidities  that affects occupational performance. Patient will benefit from skilled OT to address above impairments and improve overall function.  MODIFICATION OR ASSISTANCE TO COMPLETE EVALUATION: No modification of tasks or assist necessary to complete an evaluation.  OT OCCUPATIONAL PROFILE AND HISTORY: Problem focused assessment: Including review of records relating to presenting problem.  CLINICAL DECISION MAKING: LOW - limited treatment options, no task modification necessary  REHAB POTENTIAL: Good  EVALUATION COMPLEXITY: Low      PLAN: OT FREQUENCY: 2x/week  OT DURATION: 6 weeks  PLANNED INTERVENTIONS: self care/ADL training, therapeutic exercise, therapeutic activity, manual therapy, passive range of motion, splinting, paraffin, moist heat, patient/family education, and DME and/or AE instructions  RECOMMENDED OTHER SERVICES: N/A  CONSULTED AND AGREED WITH PLAN OF CARE: Patient  PLAN FOR NEXT SESSION: HEP progression, joint protection strategies, pain management strategies, paraffin   Leta Speller, MS, OTR/L  Darleene Cleaver, OT 03/07/2022, 8:49 PM

## 2022-03-12 ENCOUNTER — Ambulatory Visit: Payer: Medicare Other

## 2022-03-12 DIAGNOSIS — M79641 Pain in right hand: Secondary | ICD-10-CM

## 2022-03-12 DIAGNOSIS — M25642 Stiffness of left hand, not elsewhere classified: Secondary | ICD-10-CM

## 2022-03-12 DIAGNOSIS — M25641 Stiffness of right hand, not elsewhere classified: Secondary | ICD-10-CM

## 2022-03-12 NOTE — Therapy (Signed)
OUTPATIENT OCCUPATIONAL THERAPY ORTHO TREATMENT  Patient Name: Ralph Doyle MRN: [id number] DOB:24-Aug-1950, 71 y.o., male Today's Date: 03/12/2022  PCP: Dr. Belinda Fisher REFERRING PROVIDER: Dr. Vernelle Emerald (Rheumatology)   OT End of Session - 03/12/22 1315     Visit Number 7    Number of Visits 12    Date for OT Re-Evaluation 04/03/22    Progress Note Due on Visit 10    OT Start Time 0855    OT Stop Time 0930    OT Time Calculation (min) 35 min    Activity Tolerance Patient tolerated treatment well    Behavior During Therapy [facility] for tasks assessed/performed               Past Medical History:  Diagnosis Date   Cancer ([place])    Thyroid   Hyperlipidemia    Hypertension    Rheumatoid arthritis ([place])    Past Surgical History:  Procedure Laterality Date   COLONOSCOPY WITH PROPOFOL     COLONOSCOPY WITH PROPOFOL N/A 10/08/2021   Procedure: COLONOSCOPY WITH PROPOFOL;  Surgeon: Lucilla Lame, MD;  Location: [facility] ENDOSCOPY;  Service: Endoscopy;  Laterality: N/A;   MANDIBLE SURGERY     THYROIDECTOMY Bilateral 05/16/2020   Procedure: TOTAL THYROIDECTOMY;  Surgeon: Clyde Canterbury, MD;  Location: [facility] ORS;  Service: ENT;  Laterality: Bilateral;   Patient Active Problem List   Diagnosis Date Noted   Neck pain 10/30/2021   Positive colorectal cancer screening using Cologuard test    Polyp of transverse colon    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   Pain in right hand 10/12/2020   Pain of left hand 10/12/2020   Flexor tenosynovitis of finger 08/15/2020   Rheumatoid arthritis with rheumatoid factor of multiple sites without organ or systems involvement ([place]) 06/14/2020   High risk medication use 06/14/2020   Thyroid malignant neoplasm ([place]) 05/16/2020    ONSET DATE: 02/04/22 (date of referral)  REFERRING DIAG: M05.79 (ICD-10-CM) - Rheumatoid arthritis with rheumatoid factor of multiple sites without organ or systems involvement ([place])  THERAPY DIAG:  Stiffness  of hand joint, right  Stiffness of hand joint, left  Bilateral hand pain  Rationale for Evaluation and Treatment Rehabilitation  SUBJECTIVE:   SUBJECTIVE STATEMENT: Pt arrived late, apologetic for getting behind this morning.  PERTINENT HISTORY: Per rheumatology office note on 02/04/22, Ralph Doyle is a 70 y.o. male here for follow up for seropositive RA on MTX 96.7 mg PO weekly folic acid 1 mg daily tried tapering prednisone and trying turmeric supplementation.  Overall symptoms are doing fairly well. He has some pain in is left middle finger hands otherwise about the same as before. He is having pain at the base of the neck more than usual, going for massages these temporarily help. Pain is somewhat worse with looking side to side.  PAIN:  Are you having pain? 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, nutritional   PATIENT GOALS: improve pain and stiffness in hands  OBJECTIVE:   HAND DOMINANCE: Right  FUNCTIONAL OUTCOME MEASURES: FOTO: 69  UPPER EXTREMITY ROM     Active ROM Right eval Left eval  Shoulder flexion 158 155  Shoulder abduction 180 160  Shoulder adduction    Shoulder extension    Shoulder internal rotation WNL WNL  Shoulder external rotation WNL WNL  Elbow flexion    Elbow extension  Wrist flexion 80 75  Wrist extension 55 62  Wrist ulnar deviation    Wrist radial deviation    Wrist pronation    Wrist supination    (Blank rows = not tested)  Active ROM Right eval Left eval  Thumb MCP (0-60)    Thumb IP (0-80)    Thumb Radial abd/add (0-55)     Thumb Palmar abd/add (0-45)     Thumb Opposition to Small Finger     Index MCP (0-90)  -30* ext   Index PIP (0-100)     Index DIP (0-70)      Long MCP (0-90)   -30* ext   Long PIP (0-100)      Long DIP (0-70)      Ring MCP (0-90)      Ring PIP (0-100) -15* ext     Ring DIP (0-70)      Little MCP  (0-90)      Little PIP (0-100)      Little DIP (0-70)      (Blank rows = not tested)   TODAY'S TREATMENT:    Moist heat: Bilat hands x5 min for pain reduction, muscle relaxation in prep for therapeutic exercises    Manual Therapy: Soft tissue massage surrounding R RF PIP joint, DIP and PIP joints of R/L IF, MCPs of all digits both hands to promote circulation for pain reduction and reducing joint inflammation.  Soft tissue massage completed at the flexor tendon of the R RF, and entire palmar surface of the L and R hand to target tight flexor tendons, working to increase tissue elasticity for increasing ROM.   Therapeutic Exercise:  Performed gentle, prolonged passive stretching for R RF PIP extension and MCP extension on all digits each hand with good tolerance.  PATIENT EDUCATION: Education details: HEP, soft tissue massage, self passive stretching, soft stretch ext splint, digit sleeves Person educated: Patient Education method: Explanation and Verbal cues Education comprehension: verbalized understanding   HOME EXERCISE PROGRAM: Theraputty, tendon gliding, wrist and MCP ext stretching  GOALS: Goals reviewed with patient? Yes  SHORT TERM GOALS: Target date: 03/13/22    Pt will be indep with HEP for improving ROM in BUEs Baseline: HEP not yet initiated Goal status: INITIAL   LONG TERM GOALS: Target date: 04/02/22    Pt will increase FOTO score to 70 or greater to indicate pt perceived improvement in functional performance with daily tasks. Baseline: 69 Goal status: INITIAL  2.  Pt will verbalize at least 3 joint protection strategies for long term maintenance of RA symptoms. Baseline: Education not yet initiated Goal status: INITIAL  3.  Pt will verbalize at least 2 pain management strategies to manage pain in BUEs for better tolerance to daily tasks. Baseline: Education not yet initiated Goal status: INITIAL  4.  Pt will be indep to verbalize/demo splinting regimen  with ring splints (ie: schedule/precautions/donning/doffing) for minimizing flexion contractures in R RF and L LF. Baseline: Splints not yet obtained Goal status: INITIAL   ASSESSMENT:  CLINICAL IMPRESSION: Good tolerance to moist heat, soft tissue massage, passive stretching.  Treatment shortened today as pt arrived late.  Pt reports he continues to wear his soft extension splint for the R RF PIP at home.  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  Pt will benefit  from skilled OT for continued HEP instruction for increasing BUE ROM, education on pain management, continued joint protection, activity modification strategies, and splint monitoring to reduce flexion contractures in bilat hands.    PERFORMANCE DEFICITS in functional skills including ADLs, IADLs, sensation, ROM, pain, flexibility, body mechanics, decreased knowledge of precautions, decreased knowledge of use of DME, and UE functional use.  IMPAIRMENTS are limiting patient from ADLs, IADLs, work, and leisure.   COMORBIDITIES may have co-morbidities  that affects occupational performance. Patient will benefit from skilled OT to address above impairments and improve overall function.  MODIFICATION OR ASSISTANCE TO COMPLETE EVALUATION: No modification of tasks or assist necessary to complete an evaluation.  OT OCCUPATIONAL PROFILE AND HISTORY: Problem focused assessment: Including review of records relating to presenting problem.  CLINICAL DECISION MAKING: LOW - limited treatment options, no task modification necessary  REHAB POTENTIAL: Good  EVALUATION COMPLEXITY: Low      PLAN: OT FREQUENCY: 2x/week  OT DURATION: 6 weeks  PLANNED INTERVENTIONS: self care/ADL training, therapeutic exercise, therapeutic activity, manual therapy, passive range of motion, splinting, paraffin,  moist heat, patient/family education, and DME and/or AE instructions  RECOMMENDED OTHER SERVICES: N/A  CONSULTED AND AGREED WITH PLAN OF CARE: Patient  PLAN FOR NEXT SESSION: HEP progression, joint protection strategies, pain management strategies, paraffin   Leta Speller, MS, OTR/L  Darleene Cleaver, OT 03/12/2022, 1:17 PM

## 2022-03-14 ENCOUNTER — Ambulatory Visit: Payer: Medicare Other

## 2022-03-19 ENCOUNTER — Ambulatory Visit: Payer: Medicare Other

## 2022-03-21 ENCOUNTER — Ambulatory Visit: Payer: Medicare Other

## 2022-03-21 DIAGNOSIS — M79641 Pain in right hand: Secondary | ICD-10-CM

## 2022-03-21 DIAGNOSIS — M25641 Stiffness of right hand, not elsewhere classified: Secondary | ICD-10-CM

## 2022-03-21 DIAGNOSIS — M25642 Stiffness of left hand, not elsewhere classified: Secondary | ICD-10-CM

## 2022-03-24 NOTE — Therapy (Signed)
OUTPATIENT OCCUPATIONAL THERAPY ORTHO TREATMENT  Patient Name: Ralph Doyle MRN: [id number] DOB:[birth year], 71 y.o., male Today's Date: 03/24/2022  PCP: Dr. Belinda Fisher REFERRING PROVIDER: Dr. Vernelle Emerald (Rheumatology)   OT End of Session - 03/24/22 1100     Visit Number 8    Number of Visits 12    Date for OT Re-Evaluation 04/03/22    Progress Note Due on Visit 10    OT Start Time 0830    OT Stop Time 0915    OT Time Calculation (min) 45 min    Activity Tolerance Patient tolerated treatment well    Behavior During Therapy [facility] for tasks assessed/performed               Past Medical History:  Diagnosis Date   Cancer ([place])    Thyroid   Hyperlipidemia    Hypertension    Rheumatoid arthritis ([place])    Past Surgical History:  Procedure Laterality Date   COLONOSCOPY WITH PROPOFOL     COLONOSCOPY WITH PROPOFOL N/A 10/08/2021   Procedure: COLONOSCOPY WITH PROPOFOL;  Surgeon: Lucilla Lame, MD;  Location: [facility] ENDOSCOPY;  Service: Endoscopy;  Laterality: N/A;   MANDIBLE SURGERY     THYROIDECTOMY Bilateral 05/16/2020   Procedure: TOTAL THYROIDECTOMY;  Surgeon: Clyde Canterbury, MD;  Location: [facility] ORS;  Service: ENT;  Laterality: Bilateral;   Patient Active Problem List   Diagnosis Date Noted   Neck pain 10/30/2021   Positive colorectal cancer screening using Cologuard test    Polyp of transverse colon    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   Pain in right hand 10/12/2020   Pain of left hand 10/12/2020   Flexor tenosynovitis of finger 08/15/2020   Rheumatoid arthritis with rheumatoid factor of multiple sites without organ or systems involvement ([place]) 06/14/2020   High risk medication use 06/14/2020   Thyroid malignant neoplasm ([place]) 05/16/2020    ONSET DATE: 02/04/22 (date of referral)  REFERRING DIAG: M05.79 (ICD-10-CM) - Rheumatoid arthritis with rheumatoid factor of multiple sites without organ or systems involvement ([place])  THERAPY DIAG:  Stiffness  of hand joint, right  Stiffness of hand joint, left  Bilateral hand pain  Rationale for Evaluation and Treatment Rehabilitation  SUBJECTIVE:   SUBJECTIVE STATEMENT: Pt reporting higher pain levels today. (see below)  PERTINENT HISTORY: Per rheumatology office note on 02/04/22, Ralph Doyle is a 71 y.o. male here for follow up for seropositive RA on MTX 79.8 mg PO weekly folic acid 1 mg daily tried tapering prednisone and trying turmeric supplementation.  Overall symptoms are doing fairly well. He has some pain in is left middle finger hands otherwise about the same as before. He is having pain at the base of the neck more than usual, going for massages these temporarily help. Pain is somewhat worse with looking side to side.  PAIN:  Are you having pain? 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, nutritional   PATIENT GOALS: improve pain and stiffness in hands  OBJECTIVE:   HAND DOMINANCE: Right  FUNCTIONAL OUTCOME MEASURES: FOTO: 69  UPPER EXTREMITY ROM     Active ROM Right eval Left eval  Shoulder flexion 158 155  Shoulder abduction 180 160  Shoulder adduction    Shoulder extension    Shoulder internal rotation WNL WNL  Shoulder external rotation WNL WNL  Elbow flexion    Elbow extension    Wrist  flexion 80 75  Wrist extension 55 62  Wrist ulnar deviation    Wrist radial deviation    Wrist pronation    Wrist supination    (Blank rows = not tested)  Active ROM Right eval Left eval  Thumb MCP (0-60)    Thumb IP (0-80)    Thumb Radial abd/add (0-55)     Thumb Palmar abd/add (0-45)     Thumb Opposition to Small Finger     Index MCP (0-90)  -30* ext   Index PIP (0-100)     Index DIP (0-70)      Long MCP (0-90)   -30* ext   Long PIP (0-100)      Long DIP (0-70)      Ring MCP (0-90)      Ring PIP (0-100) -15* ext     Ring DIP (0-70)      Little MCP (0-90)       Little PIP (0-100)      Little DIP (0-70)      (Blank rows = not tested)   TODAY'S TREATMENT:    Moist heat: Bilat hands x5 min for pain reduction, muscle relaxation in prep for therapeutic exercises    Manual Therapy: Soft tissue massage surrounding R RF PIP joint, DIP and PIP joints of R/L IF, MCPs of all digits both hands to promote circulation for pain reduction and reducing joint inflammation.  Soft tissue massage completed at the flexor tendon of the R RF, and entire palmar surface of the L and R hand to target tight flexor tendons, working to increase tissue elasticity for increasing ROM.   Therapeutic Exercise:  Performed gentle, prolonged passive stretching for R RF PIP extension and MCP extension on all digits each hand with good tolerance.  Reinforced importance of frequent rest, stretch before and after activity, use of compression gloves and heat to manage pain and swelling.  Encouraged pt space out activities throughout the week to reduce excessive strain on hands in 1 day, particularly playing golf and playing his bass.  Made recommendation to check with pharmacist or MD regarding a topical analgesic such as voltaren gel or biofreeze, but advised these could be obtained without a Rx.  Pt verbalized understanding/receptive to recommendations.   PATIENT EDUCATION: Education details: HEP, soft tissue massage, self passive stretching, soft stretch ext splint, digit sleeves Person educated: Patient Education method: Explanation and Verbal cues Education comprehension: verbalized understanding   HOME EXERCISE PROGRAM: Theraputty, tendon gliding, wrist and MCP ext stretching  GOALS: Goals reviewed with patient? Yes  SHORT TERM GOALS: Target date: 03/13/22    Pt will be indep with HEP for improving ROM in BUEs Baseline: HEP not yet initiated Goal status: INITIAL   LONG TERM GOALS: Target date: 04/02/22    Pt will increase FOTO score to 70 or greater to indicate pt  perceived improvement in functional performance with daily tasks. Baseline: 69 Goal status: INITIAL  2.  Pt will verbalize at least 3 joint protection strategies for long term maintenance of RA symptoms. Baseline: Education not yet initiated Goal status: INITIAL  3.  Pt will verbalize at least 2 pain management strategies to manage pain in BUEs for better tolerance to daily tasks. Baseline: Education not yet initiated Goal status: INITIAL  4.  Pt will be indep to verbalize/demo splinting regimen with ring splints (ie: schedule/precautions/donning/doffing) for minimizing flexion contractures in R RF and L LF. Baseline: Splints not yet obtained Goal status: INITIAL   ASSESSMENT:  CLINICAL IMPRESSION: Good tolerance to moist heat, soft tissue massage, passive stretching.  Pt reporting increased pain today upon arrival to therapy.  Reinforced importance of frequent rest, stretch before and after activity, use of compression gloves and heat to manage pain and swelling.  Encouraged pt space out activities throughout the week to reduce excessive strain on hands in 1 day, particularly playing golf and playing his bass.  Made recommendation to check with pharmacist or MD regarding a topical analgesic such as voltaren gel or biofreeze, but advised these could be obtained without a Rx.  Pt verbalized understanding/receptive to recommendations. Pt will benefit from skilled OT for continued HEP instruction for increasing BUE ROM, education on pain management, continued joint protection, activity modification strategies, and splint monitoring to reduce flexion contractures in bilat hands.    PERFORMANCE DEFICITS in functional skills including ADLs, IADLs, sensation, ROM, pain, flexibility, body mechanics, decreased knowledge of precautions, decreased knowledge of use of DME, and UE functional use.  IMPAIRMENTS are limiting patient from ADLs, IADLs, work, and leisure.   COMORBIDITIES may have  co-morbidities  that affects occupational performance. Patient will benefit from skilled OT to address above impairments and improve overall function.  MODIFICATION OR ASSISTANCE TO COMPLETE EVALUATION: No modification of tasks or assist necessary to complete an evaluation.  OT OCCUPATIONAL PROFILE AND HISTORY: Problem focused assessment: Including review of records relating to presenting problem.  CLINICAL DECISION MAKING: LOW - limited treatment options, no task modification necessary  REHAB POTENTIAL: Good  EVALUATION COMPLEXITY: Low      PLAN: OT FREQUENCY: 2x/week  OT DURATION: 6 weeks  PLANNED INTERVENTIONS: self care/ADL training, therapeutic exercise, therapeutic activity, manual therapy, passive range of motion, splinting, paraffin, moist heat, patient/family education, and DME and/or AE instructions  RECOMMENDED OTHER SERVICES: N/A  CONSULTED AND AGREED WITH PLAN OF CARE: Patient  PLAN FOR NEXT SESSION: HEP progression, joint protection strategies, pain management strategies, paraffin   Leta Speller, MS, OTR/L  Darleene Cleaver, OT 03/24/2022, 11:02 AM

## 2022-03-26 ENCOUNTER — Ambulatory Visit: Payer: Medicare Other

## 2022-03-26 DIAGNOSIS — M79641 Pain in right hand: Secondary | ICD-10-CM

## 2022-03-26 DIAGNOSIS — M25642 Stiffness of left hand, not elsewhere classified: Secondary | ICD-10-CM

## 2022-03-26 DIAGNOSIS — M25641 Stiffness of right hand, not elsewhere classified: Secondary | ICD-10-CM

## 2022-03-27 ENCOUNTER — Encounter: Payer: Medicare Other | Admitting: Physical Therapy

## 2022-03-27 NOTE — Therapy (Signed)
OUTPATIENT OCCUPATIONAL THERAPY ORTHO TREATMENT  Patient Name: Ralph Doyle MRN: [id number] DOB:February 28, 1951, 71 y.o., male Today's Date: 03/27/2022  PCP: Dr. Belinda Fisher REFERRING PROVIDER: Dr. Vernelle Emerald (Rheumatology)   OT End of Session - 03/27/22 1333     Visit Number 9    Number of Visits 12    Date for OT Re-Evaluation 04/03/22    Progress Note Due on Visit 10    OT Start Time 1515    OT Stop Time 1600    OT Time Calculation (min) 45 min    Activity Tolerance Patient tolerated treatment well    Behavior During Therapy [facility] for tasks assessed/performed               Past Medical History:  Diagnosis Date   Cancer ([place])    Thyroid   Hyperlipidemia    Hypertension    Rheumatoid arthritis ([place])    Past Surgical History:  Procedure Laterality Date   COLONOSCOPY WITH PROPOFOL     COLONOSCOPY WITH PROPOFOL N/A 10/08/2021   Procedure: COLONOSCOPY WITH PROPOFOL;  Surgeon: Lucilla Lame, MD;  Location: [facility] ENDOSCOPY;  Service: Endoscopy;  Laterality: N/A;   MANDIBLE SURGERY     THYROIDECTOMY Bilateral 05/16/2020   Procedure: TOTAL THYROIDECTOMY;  Surgeon: Clyde Canterbury, MD;  Location: [facility] ORS;  Service: ENT;  Laterality: Bilateral;   Patient Active Problem List   Diagnosis Date Noted   Neck pain 10/30/2021   Positive colorectal cancer screening using Cologuard test    Polyp of transverse colon    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   Pain in right hand 10/12/2020   Pain of left hand 10/12/2020   Flexor tenosynovitis of finger 08/15/2020   Rheumatoid arthritis with rheumatoid factor of multiple sites without organ or systems involvement ([place]) 06/14/2020   High risk medication use 06/14/2020   Thyroid malignant neoplasm ([place]) 05/16/2020    ONSET DATE: 02/04/22 (date of referral)  REFERRING DIAG: M05.79 (ICD-10-CM) - Rheumatoid arthritis with rheumatoid factor of multiple sites without organ or systems involvement ([place])  THERAPY DIAG:  Stiffness  of hand joint, right  Stiffness of hand joint, left  Bilateral hand pain  Rationale for Evaluation and Treatment Rehabilitation  SUBJECTIVE:   SUBJECTIVE STATEMENT: Pt reporting 7/10 pain again.  Stated that he had played golf and his bass yesterday.  PERTINENT HISTORY: Per rheumatology office note on 02/04/22, Asia Favata is a 71 y.o. male here for follow up for seropositive RA on MTX 03.5 mg PO weekly folic acid 1 mg daily tried tapering prednisone and trying turmeric supplementation.  Overall symptoms are doing fairly well. He has some pain in is left middle finger hands otherwise about the same as before. He is having pain at the base of the neck more than usual, going for massages these temporarily help. Pain is somewhat worse with looking side to side.  PAIN:  Are you having pain? 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, nutritional   PATIENT GOALS: improve pain and stiffness in hands  OBJECTIVE:   HAND DOMINANCE: Right  FUNCTIONAL OUTCOME MEASURES: FOTO: 69  UPPER EXTREMITY ROM     Active ROM Right eval Left eval  Shoulder flexion 158 155  Shoulder abduction 180 160  Shoulder adduction    Shoulder extension    Shoulder internal rotation WNL WNL  Shoulder external rotation WNL WNL  Elbow flexion  Elbow extension    Wrist flexion 80 75  Wrist extension 55 62  Wrist ulnar deviation    Wrist radial deviation    Wrist pronation    Wrist supination    (Blank rows = not tested)  Active ROM Right eval Left eval  Thumb MCP (0-60)    Thumb IP (0-80)    Thumb Radial abd/add (0-55)     Thumb Palmar abd/add (0-45)     Thumb Opposition to Small Finger     Index MCP (0-90)  -30* ext   Index PIP (0-100)     Index DIP (0-70)      Long MCP (0-90)   -30* ext   Long PIP (0-100)      Long DIP (0-70)      Ring MCP (0-90)      Ring PIP (0-100) -15* ext     Ring DIP  (0-70)      Little MCP (0-90)      Little PIP (0-100)      Little DIP (0-70)      (Blank rows = not tested)   TODAY'S TREATMENT:    Moist heat: Bilat hands x5 min for pain reduction, muscle relaxation in prep for therapeutic exercises    Manual Therapy: Soft tissue massage surrounding R RF PIP joint, DIP and PIP joints of R/L IF, MCPs of all digits both hands to promote circulation for pain reduction and reducing joint inflammation.  Soft tissue massage completed at the flexor tendon of the R RF, and entire palmar surface of the L and R hand to target tight flexor tendons, working to increase tissue elasticity for increasing ROM.   Therapeutic Exercise:  Performed gentle, prolonged passive stretching for R RF PIP extension and MCP extension on all digits, bilat wrist flex each hand with good tolerance.  Completed wall slides for bilat shoulder flex and abd with min tactile and vc; reviewed shoulder stretches to complete before/after a round of golf.  Reinforced importance of frequent rest, stretch before and after activity, use of compression gloves and heat to manage pain and swelling.  Continue to encouraged pt space out activities throughout the week to reduce excessive strain on hands in 1 day, particularly playing golf and playing his bass.   PATIENT EDUCATION: Education details: HEP, soft tissue massage, self passive stretching, soft stretch ext splint, digi sleeves Person educated: Patient Education method: Explanation and Verbal cues Education comprehension: verbalized understanding   HOME EXERCISE PROGRAM: Theraputty, tendon gliding, wrist and MCP ext stretching  GOALS: Goals reviewed with patient? Yes  SHORT TERM GOALS: Target date: 03/13/22    Pt will be indep with HEP for improving ROM in BUEs Baseline: HEP not yet initiated Goal status: INITIAL   LONG TERM GOALS: Target date: 04/02/22    Pt will increase FOTO score to 70 or greater to indicate pt perceived improvement  in functional performance with daily tasks. Baseline: 69 Goal status: INITIAL  2.  Pt will verbalize at least 3 joint protection strategies for long term maintenance of RA symptoms. Baseline: Education not yet initiated Goal status: INITIAL  3.  Pt will verbalize at least 2 pain management strategies to manage pain in BUEs for better tolerance to daily tasks. Baseline: Education not yet initiated Goal status: INITIAL  4.  Pt will be indep to verbalize/demo splinting regimen with ring splints (ie: schedule/precautions/donning/doffing) for minimizing flexion contractures in R RF and L LF. Baseline: Splints not yet obtained Goal status: INITIAL  ASSESSMENT:  CLINICAL IMPRESSION: Good tolerance to moist heat, soft tissue massage, passive stretching.  Pt continues to report high pain levels today upon arrival to therapy; 7/10 in bilat hands.  Pt stated that he had played  a round of golf and played his bass yesterday.  Reinforced importance of frequent rest, stretch before and after activity, use of compression gloves and heat to manage pain and swelling.  Continue to encourage pt space out activities throughout the week to reduce excessive strain on hands in 1 day, particularly playing golf and playing his bass.  Pt verbalized understanding/receptive to recommendations.  Palmar side of hands continue to feel tight; thick cording of tendons on the ulnar side of palm in each hand are palpable.  OT encouraged pt follow up with rheumatologist at his next follow up in Nov regarding possibility of Dupytren's contracture.  OT provided education on importance of daily stretching of bilat wrists and hands; good return demo to use edge of table for wrist and digit extension stretching.  Pt will benefit from skilled OT for continued HEP instruction for increasing BUE ROM, education on pain management, continued joint protection, activity modification strategies, and splint monitoring to reduce flexion  contractures in bilat hands.    PERFORMANCE DEFICITS in functional skills including ADLs, IADLs, sensation, ROM, pain, flexibility, body mechanics, decreased knowledge of precautions, decreased knowledge of use of DME, and UE functional use.  IMPAIRMENTS are limiting patient from ADLs, IADLs, work, and leisure.   COMORBIDITIES may have co-morbidities  that affects occupational performance. Patient will benefit from skilled OT to address above impairments and improve overall function.  MODIFICATION OR ASSISTANCE TO COMPLETE EVALUATION: No modification of tasks or assist necessary to complete an evaluation.  OT OCCUPATIONAL PROFILE AND HISTORY: Problem focused assessment: Including review of records relating to presenting problem.  CLINICAL DECISION MAKING: LOW - limited treatment options, no task modification necessary  REHAB POTENTIAL: Good  EVALUATION COMPLEXITY: Low      PLAN: OT FREQUENCY: 2x/week  OT DURATION: 6 weeks  PLANNED INTERVENTIONS: self care/ADL training, therapeutic exercise, therapeutic activity, manual therapy, passive range of motion, splinting, paraffin, moist heat, patient/family education, and DME and/or AE instructions  RECOMMENDED OTHER SERVICES: N/A  CONSULTED AND AGREED WITH PLAN OF CARE: Patient  PLAN FOR NEXT SESSION: HEP progression, joint protection strategies, pain management strategies, paraffin   Leta Speller, MS, OTR/L  Darleene Cleaver, OT 03/27/2022, 1:34 PM

## 2022-03-28 ENCOUNTER — Ambulatory Visit: Payer: Medicare Other

## 2022-03-28 DIAGNOSIS — M25641 Stiffness of right hand, not elsewhere classified: Secondary | ICD-10-CM | POA: Diagnosis not present

## 2022-03-28 DIAGNOSIS — M25642 Stiffness of left hand, not elsewhere classified: Secondary | ICD-10-CM

## 2022-03-28 DIAGNOSIS — M79641 Pain in right hand: Secondary | ICD-10-CM

## 2022-03-28 NOTE — Therapy (Unsigned)
OUTPATIENT OCCUPATIONAL THERAPY ORTHO TREATMENT  Patient Name: Ralph Doyle MRN: [id number] DOB:1950/12/06, 71 y.o., male Today's Date: 03/28/2022  PCP: Dr. Belinda Fisher REFERRING PROVIDER: Dr. Vernelle Emerald (Rheumatology)   OT End of Session - 03/28/22 0829     Visit Number 10    Number of Visits 12    Date for OT Re-Evaluation 04/03/22    Progress Note Due on Visit 10    OT Start Time 0830    OT Stop Time 0915    OT Time Calculation (min) 45 min    Activity Tolerance Patient tolerated treatment well    Behavior During Therapy [facility] for tasks assessed/performed               Past Medical History:  Diagnosis Date   Cancer ([employer])    Thyroid   Hyperlipidemia    Hypertension    Rheumatoid arthritis ([place])    Past Surgical History:  Procedure Laterality Date   COLONOSCOPY WITH PROPOFOL     COLONOSCOPY WITH PROPOFOL N/A 10/08/2021   Procedure: COLONOSCOPY WITH PROPOFOL;  Surgeon: Lucilla Lame, MD;  Location: [facility] ENDOSCOPY;  Service: Endoscopy;  Laterality: N/A;   MANDIBLE SURGERY     THYROIDECTOMY Bilateral 05/16/2020   Procedure: TOTAL THYROIDECTOMY;  Surgeon: Clyde Canterbury, MD;  Location: [facility] ORS;  Service: ENT;  Laterality: Bilateral;   Patient Active Problem List   Diagnosis Date Noted   Neck pain 10/30/2021   Positive colorectal cancer screening using Cologuard test    Polyp of transverse colon    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   Pain in right hand 10/12/2020   Pain of left hand 10/12/2020   Flexor tenosynovitis of finger 08/15/2020   Rheumatoid arthritis with rheumatoid factor of multiple sites without organ or systems involvement ([place]) 06/14/2020   High risk medication use 06/14/2020   Thyroid malignant neoplasm ([place]) 05/16/2020    ONSET DATE: 02/04/22 (date of referral)  REFERRING DIAG: M05.79 (ICD-10-CM) - Rheumatoid arthritis with rheumatoid factor of multiple sites without organ or systems involvement ([place])  THERAPY DIAG:  Stiffness  of hand joint, right  Stiffness of hand joint, left  Bilateral hand pain  Rationale for Evaluation and Treatment Rehabilitation  SUBJECTIVE:   SUBJECTIVE STATEMENT: Pt reporting 7/10 pain again.  Stated that he had played golf and his bass yesterday.  PERTINENT HISTORY: Per rheumatology office note on 02/04/22, Ralph Doyle is a 71 y.o. male here for follow up for seropositive RA on MTX 21.3 mg PO weekly folic acid 1 mg daily tried tapering prednisone and trying turmeric supplementation.  Overall symptoms are doing fairly well. He has some pain in is left middle finger hands otherwise about the same as before. He is having pain at the base of the neck more than usual, going for massages these temporarily help. Pain is somewhat worse with looking side to side.  PAIN:  Are you having pain? 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, nutritional   PATIENT GOALS: improve pain and stiffness in hands  OBJECTIVE:   HAND DOMINANCE: Right  FUNCTIONAL OUTCOME MEASURES: FOTO: 69  HAND FUNCTION: Grip strength: Right: 100 lbs; Left: 103 lbs, Lateral pinch: Right: 27 lbs, Left: 24 lbs, and 3 point pinch: Right: 19 lbs, Left: 24 lbs  Right: 98; L: 96; lateral pinch R: 28, L 24; 3 point pinch: R,15, L 22 COORDINATION: 9 Hole Peg  test: Right: 24 sec; Left: 19 sec UPPER EXTREMITY ROM     Active ROM Right eval Left eval Right 03/28/22 Left  03/28/22  Shoulder flexion 158 155 161 161  Shoulder abduction 180 160 180 170  Shoulder adduction      Shoulder extension      Shoulder internal rotation WNL WNL    Shoulder external rotation WNL WNL    Elbow flexion      Elbow extension      Wrist flexion 80 75 78 70  Wrist extension 55 62 55 63  Wrist ulnar deviation      Wrist radial deviation      Wrist pronation      Wrist supination      (Blank rows = not tested)  Active ROM Right eval  Left eval Right 03/28/22 Left  03/28/22  Thumb MCP (0-60)      Thumb IP (0-80)      Thumb Radial abd/add (0-55)       Thumb Palmar abd/add (0-45)       Thumb Opposition to Small Finger       Index MCP (0-90)  -30* ext   -25 ext  Index PIP (0-100)       Index DIP (0-70)        Long MCP (0-90)   -30* ext   -30 ext  Long PIP (0-100)        Long DIP (0-70)        Ring MCP (0-90)        Ring PIP (0-100) -15* ext     -10  Ring DIP (0-70)        Little MCP (0-90)        Little PIP (0-100)        Little DIP (0-70)        (Blank rows = not tested)   TODAY'S TREATMENT:    Moist heat: Bilat hands x5 min for pain reduction, muscle relaxation in prep for therapeutic exercises    Manual Therapy: Soft tissue massage surrounding R RF PIP joint, DIP and PIP joints of R/L IF, MCPs of all digits both hands to promote circulation for pain reduction and reducing joint inflammation.  Soft tissue massage completed at the flexor tendon of the R RF, and entire palmar surface of the L and R hand to target tight flexor tendons, working to increase tissue elasticity for increasing ROM.   Therapeutic Exercise:  Performed gentle, prolonged passive stretching for R RF PIP extension and MCP extension on all digits, bilat wrist flex each hand with good tolerance.  Completed wall slides for bilat shoulder flex and abd with min tactile and vc; reviewed shoulder stretches to complete before/after a round of golf.  Reinforced importance of frequent rest, stretch before and after activity, use of compression gloves and heat to manage pain and swelling.  Continue to encouraged pt space out activities throughout the week to reduce excessive strain on hands in 1 day, particularly playing golf and playing his bass.   PATIENT EDUCATION: Education details: HEP, soft tissue massage, self passive stretching, soft stretch ext splint, digi sleeves Person educated: Patient Education method: Explanation and Verbal cues Education  comprehension: verbalized understanding   HOME EXERCISE PROGRAM: Theraputty, tendon gliding, wrist and MCP ext stretching  GOALS: Goals reviewed with patient? Yes  SHORT TERM GOALS: Target date: 03/13/22    Pt will be indep with HEP for improving ROM in BUEs Baseline: HEP not yet initiated Goal  status: INITIAL   LONG TERM GOALS: Target date: 04/02/22    Pt will increase FOTO score to 70 or greater to indicate pt perceived improvement in functional performance with daily tasks. Baseline: 69 Goal status: INITIAL  2.  Pt will verbalize at least 3 joint protection strategies for long term maintenance of RA symptoms. Baseline: Education not yet initiated Goal status: INITIAL  3.  Pt will verbalize at least 2 pain management strategies to manage pain in BUEs for better tolerance to daily tasks. Baseline: Education not yet initiated Goal status: INITIAL  4.  Pt will be indep to verbalize/demo splinting regimen with ring splints (ie: schedule/precautions/donning/doffing) for minimizing flexion contractures in R RF and L LF. Baseline: Splints not yet obtained Goal status: INITIAL   ASSESSMENT:  CLINICAL IMPRESSION: Good tolerance to moist heat, soft tissue massage, passive stretching.  Pt continues to report high pain levels today upon arrival to therapy; 7/10 in bilat hands.  Pt stated that he had played  a round of golf and played his bass yesterday.  Reinforced importance of frequent rest, stretch before and after activity, use of compression gloves and heat to manage pain and swelling.  Continue to encourage pt space out activities throughout the week to reduce excessive strain on hands in 1 day, particularly playing golf and playing his bass.  Pt verbalized understanding/receptive to recommendations.  Palmar side of hands continue to feel tight; thick cording of tendons on the ulnar side of palm in each hand are palpable.  OT encouraged pt follow up with rheumatologist at his next  follow up in Nov regarding possibility of Dupytren's contracture.  OT provided education on importance of daily stretching of bilat wrists and hands; good return demo to use edge of table for wrist and digit extension stretching.  Pt will benefit from skilled OT for continued HEP instruction for increasing BUE ROM, education on pain management, continued joint protection, activity modification strategies, and splint monitoring to reduce flexion contractures in bilat hands.    PERFORMANCE DEFICITS in functional skills including ADLs, IADLs, sensation, ROM, pain, flexibility, body mechanics, decreased knowledge of precautions, decreased knowledge of use of DME, and UE functional use.  IMPAIRMENTS are limiting patient from ADLs, IADLs, work, and leisure.   COMORBIDITIES may have co-morbidities  that affects occupational performance. Patient will benefit from skilled OT to address above impairments and improve overall function.  MODIFICATION OR ASSISTANCE TO COMPLETE EVALUATION: No modification of tasks or assist necessary to complete an evaluation.  OT OCCUPATIONAL PROFILE AND HISTORY: Problem focused assessment: Including review of records relating to presenting problem.  CLINICAL DECISION MAKING: LOW - limited treatment options, no task modification necessary  REHAB POTENTIAL: Good  EVALUATION COMPLEXITY: Low      PLAN: OT FREQUENCY: 2x/week  OT DURATION: 6 weeks  PLANNED INTERVENTIONS: self care/ADL training, therapeutic exercise, therapeutic activity, manual therapy, passive range of motion, splinting, paraffin, moist heat, patient/family education, and DME and/or AE instructions  RECOMMENDED OTHER SERVICES: N/A  CONSULTED AND AGREED WITH PLAN OF CARE: Patient  PLAN FOR NEXT SESSION: HEP progression, joint protection strategies, pain management strategies, paraffin   Leta Speller, MS, OTR/L  Darleene Cleaver, OT 03/28/2022, 8:31 AM

## 2022-03-31 ENCOUNTER — Ambulatory Visit: Payer: Medicare Other

## 2022-04-04 ENCOUNTER — Ambulatory Visit: Payer: Medicare Other | Attending: Internal Medicine

## 2022-04-04 DIAGNOSIS — M25642 Stiffness of left hand, not elsewhere classified: Secondary | ICD-10-CM | POA: Insufficient documentation

## 2022-04-04 DIAGNOSIS — M25641 Stiffness of right hand, not elsewhere classified: Secondary | ICD-10-CM | POA: Insufficient documentation

## 2022-04-04 DIAGNOSIS — M79642 Pain in left hand: Secondary | ICD-10-CM | POA: Insufficient documentation

## 2022-04-04 DIAGNOSIS — M79641 Pain in right hand: Secondary | ICD-10-CM | POA: Diagnosis present

## 2022-04-04 NOTE — Therapy (Signed)
OUTPATIENT OCCUPATIONAL THERAPY ORTHO RECERTIFICATION NOTE AND TREATMENT             Patient Name: Ralph Doyle MRN: [id number] DOB:04-13-1951, 71 y.o., male Today's Date: 04/04/2022  PCP: Dr. Belinda Fisher REFERRING PROVIDER: Dr. Vernelle Emerald (Rheumatology)   OT End of Session - 04/04/22 1522     Visit Number 11    Number of Visits 12    Date for OT Re-Evaluation 04/18/22    Authorization Time Period Reporting period beginning 03/28/22    Progress Note Due on Visit 10    OT Start Time 0830    OT Stop Time 0915    OT Time Calculation (min) 45 min    Activity Tolerance Patient tolerated treatment well    Behavior During Therapy [facility] for tasks assessed/performed               Past Medical History:  Diagnosis Date   Cancer ([place])    Thyroid   Hyperlipidemia    Hypertension    Rheumatoid arthritis ([place])    Past Surgical History:  Procedure Laterality Date   COLONOSCOPY WITH PROPOFOL     COLONOSCOPY WITH PROPOFOL N/A 10/08/2021   Procedure: COLONOSCOPY WITH PROPOFOL;  Surgeon: Lucilla Lame, MD;  Location: [facility] ENDOSCOPY;  Service: Endoscopy;  Laterality: N/A;   MANDIBLE SURGERY     THYROIDECTOMY Bilateral 05/16/2020   Procedure: TOTAL THYROIDECTOMY;  Surgeon: Clyde Canterbury, MD;  Location: [facility] ORS;  Service: ENT;  Laterality: Bilateral;   Patient Active Problem List   Diagnosis Date Noted   Neck pain 10/30/2021   Positive colorectal cancer screening using Cologuard test    Polyp of transverse colon    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   Pain in right hand 10/12/2020   Pain of left hand 10/12/2020   Flexor tenosynovitis of finger 08/15/2020   Rheumatoid arthritis with rheumatoid factor of multiple sites without organ or systems involvement ([place]) 06/14/2020   High risk medication use 06/14/2020   Thyroid malignant neoplasm ([place]) 05/16/2020    ONSET DATE: 02/04/22 (date of referral)  REFERRING DIAG: M05.79 (ICD-10-CM) - Rheumatoid arthritis with  rheumatoid factor of multiple sites without organ or systems involvement ([place])  THERAPY DIAG:  Stiffness of hand joint, right  Stiffness of hand joint, left  Bilateral hand pain  Rationale for Evaluation and Treatment Rehabilitation  SUBJECTIVE:   SUBJECTIVE STATEMENT: Pt reports he's feeling better today.  Tested negative for covid earlier in the week.  Pt thinks it was just a cold and wore a mask to therapy today.  PERTINENT HISTORY: Per rheumatology office note on 02/04/22, Ralph Doyle is a 71 y.o. male here for follow up for seropositive RA on MTX 33.8 mg PO weekly folic acid 1 mg daily tried tapering prednisone and trying turmeric supplementation.  Overall symptoms are doing fairly well. He has some pain in is left middle finger hands otherwise about the same as before. He is having pain at the base of the neck more than usual, going for massages these temporarily help. Pain is somewhat worse with looking side to side.  PAIN:  Are you having pain? 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, nutritional   PATIENT GOALS: improve pain and stiffness in hands  OBJECTIVE:   HAND DOMINANCE: Right  FUNCTIONAL OUTCOME MEASURES: FOTO: 69  HAND FUNCTION: Grip strength: Right: 100 lbs; Left: 103 lbs, Lateral  pinch: Right: 27 lbs, Left: 24 lbs, and 3 point pinch: Right: 19 lbs, Left: 24 lbs 10th visit 03/28/22: Right: 98; L: 96; lateral pinch R: 28, L 24; 3 point pinch: R,15, L 22  COORDINATION: Eval: 9 Hole Peg test: Right: 24 sec; Left: 19 sec UPPER EXTREMITY ROM     Active ROM Right eval Left eval Right 03/28/22 Left  03/28/22  Shoulder flexion 158 155 161 161  Shoulder abduction 180 160 180 170  Shoulder adduction      Shoulder extension      Shoulder internal rotation WNL WNL    Shoulder external rotation WNL WNL    Elbow flexion      Elbow extension      Wrist flexion 80  75 78 70  Wrist extension 55 62 55 63  Wrist ulnar deviation      Wrist radial deviation      Wrist pronation      Wrist supination      (Blank rows = not tested)  Active ROM Right eval Left eval Right 03/28/22 Left  03/28/22  Thumb MCP (0-60)      Thumb IP (0-80)      Thumb Radial abd/add (0-55)       Thumb Palmar abd/add (0-45)       Thumb Opposition to Small Finger       Index MCP (0-90)  -30* ext   -25 ext  Index PIP (0-100)       Index DIP (0-70)        Long MCP (0-90)   -30* ext   -30 ext  Long PIP (0-100)        Long DIP (0-70)        Ring MCP (0-90)        Ring PIP (0-100) -15* ext     -10  Ring DIP (0-70)        Little MCP (0-90)        Little PIP (0-100)        Little DIP (0-70)        (Blank rows = not tested)   TODAY'S TREATMENT:    Moist heat: Bilat hands x5 min for pain reduction, muscle relaxation in prep for therapeutic exercises    Manual Therapy: Soft tissue massage surrounding R RF PIP joint, DIP and PIP joints of R/L IF, MCPs of all digits both hands to promote circulation for pain reduction and reducing joint inflammation.  Soft tissue massage completed at the flexor tendon of the R RF and SF, and entire palmar surface of the L and R hand to target tight flexor tendons, working to increase tissue elasticity for increasing ROM.   Therapeutic Exercise:  Performed R hand volar glides for increasing digit extension at R hand MP joints of digits 2-5, and PIP joint of R RF.  Volar glides performed on the L hand digits 2-5 MP and PIP joint of SF and RF for increasing extensor range.   Performed gentle, prolonged passive stretching for R RF PIP extension and MCP extension on all digits, bilat wrist ext each hand with good tolerance.  Performed metacarpal spread stretches all digits.       PATIENT EDUCATION: Education details: HEP, self passive stretching, soft stretch ext splint (increase wearing time), review of joint protection strategies  Person educated:  Patient Education method: Explanation and Verbal cues Education comprehension: verbalized understanding   HOME EXERCISE PROGRAM: Theraputty, tendon gliding, wrist and MCP/PIP ext stretching, shoulder ROM  GOALS: Goals reviewed with patient? Yes  SHORT TERM GOALS: Target date: 03/13/22    Pt will be indep with HEP for improving ROM in BUEs Baseline: HEP not yet initiated; 03/28/22: performs with min vc/ongoing Goal status: partially met   LONG TERM GOALS: Target date: 04/18/22    Pt will increase FOTO score to 70 or greater to indicate pt perceived improvement in functional performance with daily tasks. Baseline: 69; 03/28/22: 69 Goal status: ongoing  2.  Pt will verbalize at least 3 joint protection strategies for long term maintenance of RA symptoms. Baseline: Education not yet initiated; 03/28/22; pt able to identify 1/3 Goal status: ongoing  3.  Pt will verbalize at least 2 pain management strategies to manage pain in BUEs for better tolerance to daily tasks. Baseline: Education not yet initiated; 03/28/22: able to identify at least 2 strategies (heat/rest/compression gloves/massage) Goal status: achieved  4.  Pt will be indep to verbalize/demo splinting regimen with ring splints (ie: schedule/precautions/donning/doffing) for minimizing flexion contractures in R RF and L LF. Baseline: Splints not yet obtained; 03/28/22: pt has been consistent to wear soft extension splint for the R RF PIP and L LF PIP joints and is tolerating well in 30-60 min sessions.  Pt has been encouraged to increase time to up to 2 hours, 2x per day. Goal status: ongoing   ASSESSMENT:  CLINICAL IMPRESSION: Pt has been seen by OT for 11 sessions.  Overall, pain improves during each visit, but pain shoots back up to 5-7/10 pain with activity during the week outside of therapy.  Pt has been educated on joint protection strategies and pain management, and continues to require reminders to utilize these  techniques for managing pain during the week when pt is not in the clinic.  Pt is consistently using his soft digit extension splint for up to 60 min with good tolerance, and has gained 5 degrees of active extension in the R RF PIP as a result of use.   Pt has been encouraged to increase splint wearing time to up to 2 hours, 2x per day in order to continue to reduce this flexion contracture.  Along with pt's RA, thick cording in the ulnar side of the palms bilaterally appear to contribute to pt's MP flexion and PIP flexion contractures in each hand.  Bilat shoulder mobility has improved slightly.  Pt is indep with cane stretches to maintain this shoulder mobility.  Pt missed a visit earlier in the week d/t illness.  Planning 1 additional visit to reinforce joint protection strategies, pain management strategies, review HEP, and provide manual therapy and therapeutic exercises for reducing pain and increasing ROM in bilat hands.    PERFORMANCE DEFICITS in functional skills including ADLs, IADLs, sensation, ROM, pain, flexibility, body mechanics, decreased knowledge of precautions, decreased knowledge of use of DME, and UE functional use.  IMPAIRMENTS are limiting patient from ADLs, IADLs, work, and leisure.   COMORBIDITIES may have co-morbidities  that affects occupational performance. Patient will benefit from skilled OT to address above impairments and improve overall function.  MODIFICATION OR ASSISTANCE TO COMPLETE EVALUATION: No modification of tasks or assist necessary to complete an evaluation.  OT OCCUPATIONAL PROFILE AND HISTORY: Problem focused assessment: Including review of records relating to presenting problem.  CLINICAL DECISION MAKING: LOW - limited treatment options, no task modification necessary  REHAB POTENTIAL: Good  EVALUATION COMPLEXITY: Low      PLAN: OT FREQUENCY: 1x a week for 2 weeks  OT DURATION: 2 weeks  PLANNED INTERVENTIONS: self care/ADL training, therapeutic  exercise, therapeutic activity, manual therapy, passive range of motion, splinting, paraffin, moist heat, patient/family education, and DME and/or AE instructions  RECOMMENDED OTHER SERVICES: N/A  CONSULTED AND AGREED WITH PLAN OF CARE: Patient  PLAN FOR NEXT SESSION: HEP progression, joint protection strategies, pain management strategies, moist heat, manual therapy  Leta Speller, MS, OTR/L  Darleene Cleaver, OT 04/04/2022, 3:23 PM

## 2022-04-07 ENCOUNTER — Ambulatory Visit: Payer: Medicare Other

## 2022-04-07 DIAGNOSIS — M79641 Pain in right hand: Secondary | ICD-10-CM

## 2022-04-07 DIAGNOSIS — M25641 Stiffness of right hand, not elsewhere classified: Secondary | ICD-10-CM

## 2022-04-07 DIAGNOSIS — M25642 Stiffness of left hand, not elsewhere classified: Secondary | ICD-10-CM

## 2022-04-07 NOTE — Therapy (Signed)
OUTPATIENT OCCUPATIONAL THERAPY DISCHARGE NOTE             Patient Name: Ralph Doyle MRN: [id number] DOB:11-18-1950, 71 y.o., male Today's Date: 04/07/2022  PCP: Dr. Belinda Fisher REFERRING PROVIDER: Dr. Vernelle Emerald (Rheumatology)   OT End of Session - 04/07/22 0837     Visit Number 12    Number of Visits 12    Date for OT Re-Evaluation 04/18/22    Authorization Time Period Reporting period beginning 03/28/22    Progress Note Due on Visit 10    OT Start Time 0830    OT Stop Time 0915    OT Time Calculation (min) 45 min    Activity Tolerance Patient tolerated treatment well    Behavior During Therapy [facility] for tasks assessed/performed             Past Medical History:  Diagnosis Date   Cancer ([place])    Thyroid   Hyperlipidemia    Hypertension    Rheumatoid arthritis ([place])    Past Surgical History:  Procedure Laterality Date   COLONOSCOPY WITH PROPOFOL     COLONOSCOPY WITH PROPOFOL N/A 10/08/2021   Procedure: COLONOSCOPY WITH PROPOFOL;  Surgeon: Lucilla Lame, MD;  Location: [facility] ENDOSCOPY;  Service: Endoscopy;  Laterality: N/A;   MANDIBLE SURGERY     THYROIDECTOMY Bilateral 05/16/2020   Procedure: TOTAL THYROIDECTOMY;  Surgeon: Clyde Canterbury, MD;  Location: [facility] ORS;  Service: ENT;  Laterality: Bilateral;   Patient Active Problem List   Diagnosis Date Noted   Neck pain 10/30/2021   Positive colorectal cancer screening using Cologuard test    Polyp of transverse colon    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   Pain in right hand 10/12/2020   Pain of left hand 10/12/2020   Flexor tenosynovitis of finger 08/15/2020   Rheumatoid arthritis with rheumatoid factor of multiple sites without organ or systems involvement ([place]) 06/14/2020   High risk medication use 06/14/2020   Thyroid malignant neoplasm ([place]) 05/16/2020    ONSET DATE: 02/04/22 (date of referral)  REFERRING DIAG: M05.79 (ICD-10-CM) - Rheumatoid arthritis with rheumatoid factor of multiple sites  without organ or systems involvement ([place])  THERAPY DIAG:  Stiffness of hand joint, right  Stiffness of hand joint, left  Bilateral hand pain  Rationale for Evaluation and Treatment Rehabilitation  SUBJECTIVE:   SUBJECTIVE STATEMENT: Pt reports doing well today.  Pt requested to review cane stretches; completed. PERTINENT HISTORY: Per rheumatology office note on 02/04/22, Ralph Doyle is a 71 y.o. male here for follow up for seropositive RA on MTX 63.0 mg PO weekly folic acid 1 mg daily tried tapering prednisone and trying turmeric supplementation.  Overall symptoms are doing fairly well. He has some pain in is left middle finger hands otherwise about the same as before. He is having pain at the base of the neck more than usual, going for massages these temporarily help. Pain is somewhat worse with looking side to side.  PAIN:  Are you having pain? 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, nutritional   PATIENT GOALS: improve pain and stiffness in hands  OBJECTIVE:   HAND DOMINANCE: Right  FUNCTIONAL OUTCOME MEASURES: FOTO: 69  HAND FUNCTION: Grip strength: Right: 100 lbs; Left: 103 lbs, Lateral pinch: Right: 27 lbs, Left: 24 lbs, and 3 point pinch: Right: 19 lbs, Left: 24 lbs 10th visit 03/28/22: Right: 98; L:  96; lateral pinch R: 28, L 24; 3 point pinch: R,15, L 22 D/c: measures carried over from last week  COORDINATION: Eval: 9 Hole Peg test: Right: 24 sec; Left: 19 sec UPPER EXTREMITY ROM     Active ROM Right eval Left eval Right 03/28/22 Left  03/28/22  Shoulder flexion 158 155 161 161  Shoulder abduction 180 160 180 170  Shoulder adduction      Shoulder extension      Shoulder internal rotation WNL WNL    Shoulder external rotation WNL WNL    Elbow flexion      Elbow extension      Wrist flexion 80 75 78 70  Wrist extension 55 62 55 63  Wrist ulnar deviation       Wrist radial deviation      Wrist pronation      Wrist supination      (Blank rows = not tested)  Active ROM Right eval Left eval Right 03/28/22 Left  03/28/22  Thumb MCP (0-60)      Thumb IP (0-80)      Thumb Radial abd/add (0-55)       Thumb Palmar abd/add (0-45)       Thumb Opposition to Small Finger       Index MCP (0-90)  -30* ext   -25 ext  Index PIP (0-100)       Index DIP (0-70)        Long MCP (0-90)   -30* ext   -30 ext  Long PIP (0-100)        Long DIP (0-70)        Ring MCP (0-90)        Ring PIP (0-100) -15* ext    -10 ext   Ring DIP (0-70)        Little MCP (0-90)        Little PIP (0-100)        Little DIP (0-70)        (Blank rows = not tested)   TODAY'S TREATMENT:    Moist heat: Bilat hands x5 min for pain reduction, muscle relaxation in prep for therapeutic exercises    Manual Therapy: Soft tissue massage surrounding R RF PIP joint, DIP and PIP joints of R/L IF, MCPs of all digits both hands to promote circulation for pain reduction and reducing joint inflammation.  Soft tissue massage completed at the flexor tendon of the R RF and SF, and entire palmar surface of the L and R hand to target tight flexor tendons, working to increase tissue elasticity for increasing ROM.   Therapeutic Exercise:  Performed R hand volar glides for increasing digit extension at R hand MP joints of digits 2-5, and PIP joint of R RF.  Volar glides performed on the L hand digits 2-5 MP and PIP joint of SF and RF for increasing extensor range.   Performed gentle, prolonged passive stretching for R RF PIP extension and MCP extension on all digits, bilat wrist ext each hand with good tolerance.  Performed metacarpal spread stretches all digits and single digit taps.  Reviewed tendon glides, wrist flex/ext stretches, digit extension stretches, and cane stretches for bilat shoulder flexibility with good return demo.       PATIENT EDUCATION: Education details: HEP, self passive stretching,  soft stretch ext splint (increase wearing time), review of joint protection strategies  Person educated: Patient Education method: Explanation and Verbal cues Education comprehension: verbalized understanding   HOME EXERCISE PROGRAM:  Theraputty, tendon gliding, wrist and MCP/PIP ext stretching, shoulder ROM  GOALS: Goals reviewed with patient? Yes  SHORT TERM GOALS: Target date: 03/13/22    Pt will be indep with HEP for improving ROM in BUEs Baseline: HEP not yet initiated; 03/28/22: performs with min vc/ongoing; 04/07/22: indep Goal status: achieved   LONG TERM GOALS: Target date: 04/18/22    Pt will increase FOTO score to 70 or greater to indicate pt perceived improvement in functional performance with daily tasks. Baseline: 69; 03/28/22: 69; 04/07/22: 63 Goal status: not met  2.  Pt will verbalize at least 3 joint protection strategies for long term maintenance of RA symptoms. Baseline: Education not yet initiated; 03/28/22; pt able to identify 1/3; 04/07/22: indep to verbalize 3 Goal status: achieved  3.  Pt will verbalize at least 2 pain management strategies to manage pain in BUEs for better tolerance to daily tasks. Baseline: Education not yet initiated; 03/28/22: able to identify at least 2 strategies (heat/rest/compression gloves/massage) Goal status: achieved  4.  Pt will be indep to verbalize/demo splinting regimen with ring splints (ie: schedule/precautions/donning/doffing) for minimizing flexion contractures in R RF and L LF. Baseline: Splints not yet obtained; 03/28/22: pt has been consistent to wear soft extension splint for the R RF PIP and L LF PIP joints and is tolerating well in 30-60 min sessions.  Pt has been encouraged to increase time to up to 2 hours, 2x per day; 04/07/22: indep with soft extension splint wearing schedule; tolerates 2 hours on without any discomfort and understands progression for increased wearing time. Goal status:  achieved   ASSESSMENT:  CLINICAL IMPRESSION: Pt has been seen by OT for 12 sessions.  Overall, pain improves during each visit, but pain shoots back up to 5-7/10 pain with activity during the week outside of therapy.  Pt has been educated on joint protection strategies and pain management, and continues to require reminders to utilize these techniques for managing pain during the week when pt is not in the clinic.  Pt is consistently using his soft digit extension splint for up to 2 hours with good tolerance, and has gained 5 degrees of active extension in the R RF PIP as a result of use.   Pt has been encouraged to increase splint wearing time to up to 2 hours, 2x per day in order to continue to reduce this flexion contracture.  Along with pt's RA, thick cording in the ulnar side of the palms bilaterally appear to contribute to pt's MP flexion and PIP flexion contractures in each hand.  Bilat shoulder mobility has improved slightly.  Pt is indep with cane stretches to maintain this shoulder mobility.  OT reviewed joint protection strategies, pain management strategies, and HEP this visit with good understanding of all.  No additional skilled OT indicated at this time.   PERFORMANCE DEFICITS in functional skills including ADLs, IADLs, sensation, ROM, pain, flexibility, body mechanics, decreased knowledge of precautions, decreased knowledge of use of DME, and UE functional use.  IMPAIRMENTS are limiting patient from ADLs, IADLs, work, and leisure.   COMORBIDITIES may have co-morbidities  that affects occupational performance. Patient will benefit from skilled OT to address above impairments and improve overall function.  MODIFICATION OR ASSISTANCE TO COMPLETE EVALUATION: No modification of tasks or assist necessary to complete an evaluation.  OT OCCUPATIONAL PROFILE AND HISTORY: Problem focused assessment: Including review of records relating to presenting problem.  CLINICAL DECISION MAKING: LOW -  limited treatment options, no task modification necessary  REHAB POTENTIAL: Good  EVALUATION COMPLEXITY: Low      PLAN: OT FREQUENCY: 1x a week for 2 weeks  OT DURATION: 2 weeks  PLANNED INTERVENTIONS: self care/ADL training, therapeutic exercise, therapeutic activity, manual therapy, passive range of motion, splinting, paraffin, moist heat, patient/family education, and DME and/or AE instructions  RECOMMENDED OTHER SERVICES: N/A  CONSULTED AND AGREED WITH PLAN OF CARE: Patient  PLAN FOR NEXT SESSION: HEP progression, joint protection strategies, pain management strategies, moist heat, manual therapy  Leta Speller, MS, OTR/L  Darleene Cleaver, OT 04/07/2022, 3:10 PM

## 2022-04-11 ENCOUNTER — Ambulatory Visit: Payer: Medicare Other

## 2022-04-14 ENCOUNTER — Ambulatory Visit: Payer: Medicare Other

## 2022-04-23 NOTE — Progress Notes (Signed)
Office Visit Note  Patient: Ralph Doyle             Date of Birth: 10/17/1950           MRN: [id number]             PCP: Lenard Simmer, MD Referring: Lenard Simmer, MD Visit Date: 05/07/2022   Subjective:  Follow-up (Doing good)   History of Present Illness: Ralph Doyle is a 71 y.o. 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  Overall symptoms are doing pretty well.  Working with occupational therapy has been beneficial for his hand pain and range of motion.  He is ended up not really trying the patch device he mentioned at the last visit I do not see significant clinical data to really recommend 1 where the other.  Previous HPI 02/04/2022 Ralph Doyle is a 71 y.o. 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 He has not required any additional steroid treatments since our last visit. He had some ultrasound shockwave treatment for tendonitis symptoms with a good benefit. Physical therapy has improved shoulder pain and range of motion. He is trying a new Aeon patch device.    Previous HPI 10/30/2021 Ralph Doyle is a 71 y.o. male here for follow up for seropositive RA on MTX 66.4 mg PO weekly folic acid 1 mg daily tried tapering prednisone and trying turmeric supplementation.  Overall symptoms are doing fairly well. He has some pain in is left middle finger hands otherwise about the same as before. He is having pain at the base of the neck more than usual, going for massages these temporarily help. Pain is somewhat worse with looking side to side.   Review of Systems  Constitutional:  Negative for fatigue.  HENT:  Negative for mouth sores and mouth dryness.   Eyes:  Positive for dryness.  Respiratory:  Negative for shortness of breath.   Cardiovascular:  Negative for chest pain and palpitations.  Gastrointestinal:  Negative for blood in stool, constipation and diarrhea.  Endocrine:  Positive for increased urination.  Genitourinary:  Negative for involuntary urination.  Musculoskeletal:  Positive for joint pain, joint pain, myalgias, morning stiffness and myalgias. Negative for gait problem, joint swelling, muscle weakness and muscle tenderness.  Skin:  Positive for color change and rash. Negative for hair loss and sensitivity to sunlight.  Allergic/Immunologic: Negative for susceptible to infections.  Neurological:  Negative for dizziness and headaches.  Hematological:  Negative for swollen glands.  Psychiatric/Behavioral:  Positive for depressed mood and sleep disturbance. The patient is not nervous/anxious.     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) 06/14/2020   High risk medication use 06/14/2020   Thyroid malignant neoplasm ([place]) 05/16/2020    Past Medical History:  Diagnosis Date   Cancer ([place])    Thyroid   Hyperlipidemia    Hypertension    Rheumatoid arthritis ([place])     Family  History  Problem Relation Age of Onset   Diabetes Mother    Hypertension Mother    Arthritis Mother    Diabetes Father    Hypertension Father    Diabetes Brother    Hypertension Brother    Hypertension Brother    Past Surgical History:  Procedure Laterality Date   COLONOSCOPY WITH PROPOFOL     COLONOSCOPY WITH PROPOFOL N/A 10/08/2021   Procedure: COLONOSCOPY WITH PROPOFOL;  Doyle: Lucilla Lame, MD;  Location: [facility] ENDOSCOPY;  Service: Endoscopy;  Laterality: N/A;   MANDIBLE SURGERY     THYROIDECTOMY Bilateral 05/16/2020    Procedure: TOTAL THYROIDECTOMY;  Doyle: Clyde Canterbury, MD;  Location: [facility] ORS;  Service: ENT;  Laterality: Bilateral;   Social History   Social History Narrative   Not on file   Immunization History  Administered Date(s) Administered   PFIZER(Purple Top)SARS-COV-2 Vaccination 08/25/2019, 09/20/2019     Objective: Vital Signs: BP 134/85 (BP Location: Right Arm, Patient Position: Sitting, Cuff Size: Normal)   Pulse (!) 54   Resp 15   Ht 5' 8.5" (1.74 m)   Wt 201 lb (91.2 kg)   BMI 30.12 kg/m    Physical Exam Cardiovascular:     Rate and Rhythm: Normal rate and regular rhythm.  Pulmonary:     Effort: Pulmonary effort is normal.     Breath sounds: Normal breath sounds.  Skin:    General: Skin is warm and dry.  Neurological:     Mental Status: He is alert.  Psychiatric:        Mood and Affect: Mood normal.      Musculoskeletal Exam:  Neck full ROM no tenderness Shoulders full ROM no tenderness or swelling Elbows full ROM no tenderness or swelling Wrists full ROM no tenderness or swelling Partial 4th-5th finger flexion contracture on left hand more so than on right hand, no tenderness or palpable synovitis Knees full ROM no tenderness or swelling  CDAI Exam: CDAI Score: 2  Patient Global: 10 mm; Provider Global: 10 mm Swollen: 0 ; Tender: 0  Joint Exam 05/07/2022   All documented joints were normal       Investigation: No additional findings.  Imaging: No results found.  Recent Labs: Lab Results  Component Value Date   WBC 8.4 05/07/2022   HGB 15.7 05/07/2022   PLT 291 05/07/2022   NA 142 05/07/2022   K 4.5 05/07/2022   CL 104 05/07/2022   CO2 32 05/07/2022   GLUCOSE 64 (L) 05/07/2022   BUN 13 05/07/2022   CREATININE 1.22 05/07/2022   BILITOT 0.6 05/07/2022   AST 15 05/07/2022   ALT 17 05/07/2022   PROT 6.5 05/07/2022   CALCIUM 9.8 05/07/2022   GFRAA 69 11/06/2020    Speciality Comments: No specialty comments available.  Procedures:   No procedures performed Allergies: Patient has no known allergies.   Assessment / Plan:     Visit Diagnoses: Rheumatoid arthritis with rheumatoid factor of multiple sites without organ or systems involvement ([place]) - Plan: C-reactive protein  Symptoms appear pretty well-controlled at this time.  Will check CRP for disease activity monitoring.  We discussed possible use of Voltaren on affected joints data is best on the knees but he has no particular contraindication.  Also discussed ongoing supplements such as turmeric for management of some osteoarthritis symptoms.  Plan to continue methotrexate 12.5 mg p.o. weekly folic acid 1 mg daily.  High risk medication use - methotrexate 12.5 mg p.o - Plan: CBC with  Differential/Platelet, COMPLETE METABOLIC PANEL WITH GFR  Checking CBC and CMP for methotrexate monitoring.  Dupuytren's contracture of both hands  Discussed the fourth and fifth finger contracture of the hands not currently causing him a lot of of pain not significantly impacting use of his hand.  Discussed this could be addressed with the procedure if he notices worsening over time currently is doing okay if anything slightly better after OT work.  Orders: Orders Placed This Encounter  Procedures   C-reactive protein   CBC with Differential/Platelet   COMPLETE METABOLIC PANEL WITH GFR   No orders of the defined types were placed in this encounter.    Follow-Up Instructions: Return in about 3 months (around 08/07/2022) for RA on MTX f/u 41mo.   CCollier Salina MD  Note - This record has been created using [employer]  Chart creation errors have been sought, but may not always  have been located. Such creation errors do not reflect on  the standard of medical care.

## 2022-05-07 ENCOUNTER — Encounter: Payer: Self-pay | Admitting: Internal Medicine

## 2022-05-07 ENCOUNTER — Ambulatory Visit: Payer: Medicare Other | Attending: Internal Medicine | Admitting: Internal Medicine

## 2022-05-07 VITALS — BP 134/85 | HR 54 | Resp 15 | Ht 68.5 in | Wt 201.0 lb

## 2022-05-07 DIAGNOSIS — M0579 Rheumatoid arthritis with rheumatoid factor of multiple sites without organ or systems involvement: Secondary | ICD-10-CM | POA: Insufficient documentation

## 2022-05-07 DIAGNOSIS — Z79899 Other long term (current) drug therapy: Secondary | ICD-10-CM | POA: Insufficient documentation

## 2022-05-07 DIAGNOSIS — M720 Palmar fascial fibromatosis [Dupuytren]: Secondary | ICD-10-CM | POA: Diagnosis present

## 2022-05-08 LAB — COMPLETE METABOLIC PANEL WITH GFR
AG Ratio: 1.8 (calc) (ref 1.0–2.5)
ALT: 17 U/L (ref 9–46)
AST: 15 U/L (ref 10–35)
Albumin: 4.2 g/dL (ref 3.6–5.1)
Alkaline phosphatase (APISO): 61 U/L (ref 35–144)
BUN: 13 mg/dL (ref 7–25)
CO2: 32 mmol/L (ref 20–32)
Calcium: 9.8 mg/dL (ref 8.6–10.3)
Chloride: 104 mmol/L (ref 98–110)
Creat: 1.22 mg/dL (ref 0.70–1.35)
Globulin: 2.3 g/dL (calc) (ref 1.9–3.7)
Glucose, Bld: 64 mg/dL — ABNORMAL LOW (ref 65–99)
Potassium: 4.5 mmol/L (ref 3.5–5.3)
Sodium: 142 mmol/L (ref 135–146)
Total Bilirubin: 0.6 mg/dL (ref 0.2–1.2)
Total Protein: 6.5 g/dL (ref 6.1–8.1)
eGFR: 64 mL/min/{1.73_m2} (ref >=60)

## 2022-05-08 LAB — CBC WITH DIFFERENTIAL/PLATELET
Absolute Monocytes: 680 cells/uL (ref 200–950)
Basophils Absolute: 42 cells/uL (ref 0–200)
Basophils Relative: 0.5 %
Eosinophils Absolute: 67 cells/uL (ref 15–500)
Eosinophils Relative: 0.8 %
HCT: 46.6 % (ref 38.5–50.0)
Hemoglobin: 15.7 g/dL (ref 13.2–17.1)
Lymphs Abs: 1873 cells/uL (ref 850–3900)
MCH: 32.6 pg (ref 27.0–33.0)
MCHC: 33.7 g/dL (ref 32.0–36.0)
MCV: 96.9 fL (ref 80.0–100.0)
MPV: 9.8 fL (ref 7.5–12.5)
Monocytes Relative: 8.1 %
Neutro Abs: 5737 cells/uL (ref 1500–7800)
Neutrophils Relative %: 68.3 %
Platelets: 291 10*3/uL (ref 140–400)
RBC: 4.81 10*6/uL (ref 4.20–5.80)
RDW: 14.4 % (ref 11.0–15.0)
Total Lymphocyte: 22.3 %
WBC: 8.4 10*3/uL (ref 3.8–10.8)

## 2022-05-08 LAB — C-REACTIVE PROTEIN: CRP: 0.7 mg/L (ref <8.0)

## 2022-05-14 IMAGING — CR DG CHEST 2V
1 series · 2 of 2 positions shown · non-contrast
Comparison: 12/21/2008

CLINICAL DATA: 69-year-old male with history of thyroid cancer and
hypertension methotrexate treatment

EXAM:
CHEST - 2 VIEW

[Series 1: dg chest 2 view · 0.14mm/px · 2 of 2 slices shown]
[im 1/2]
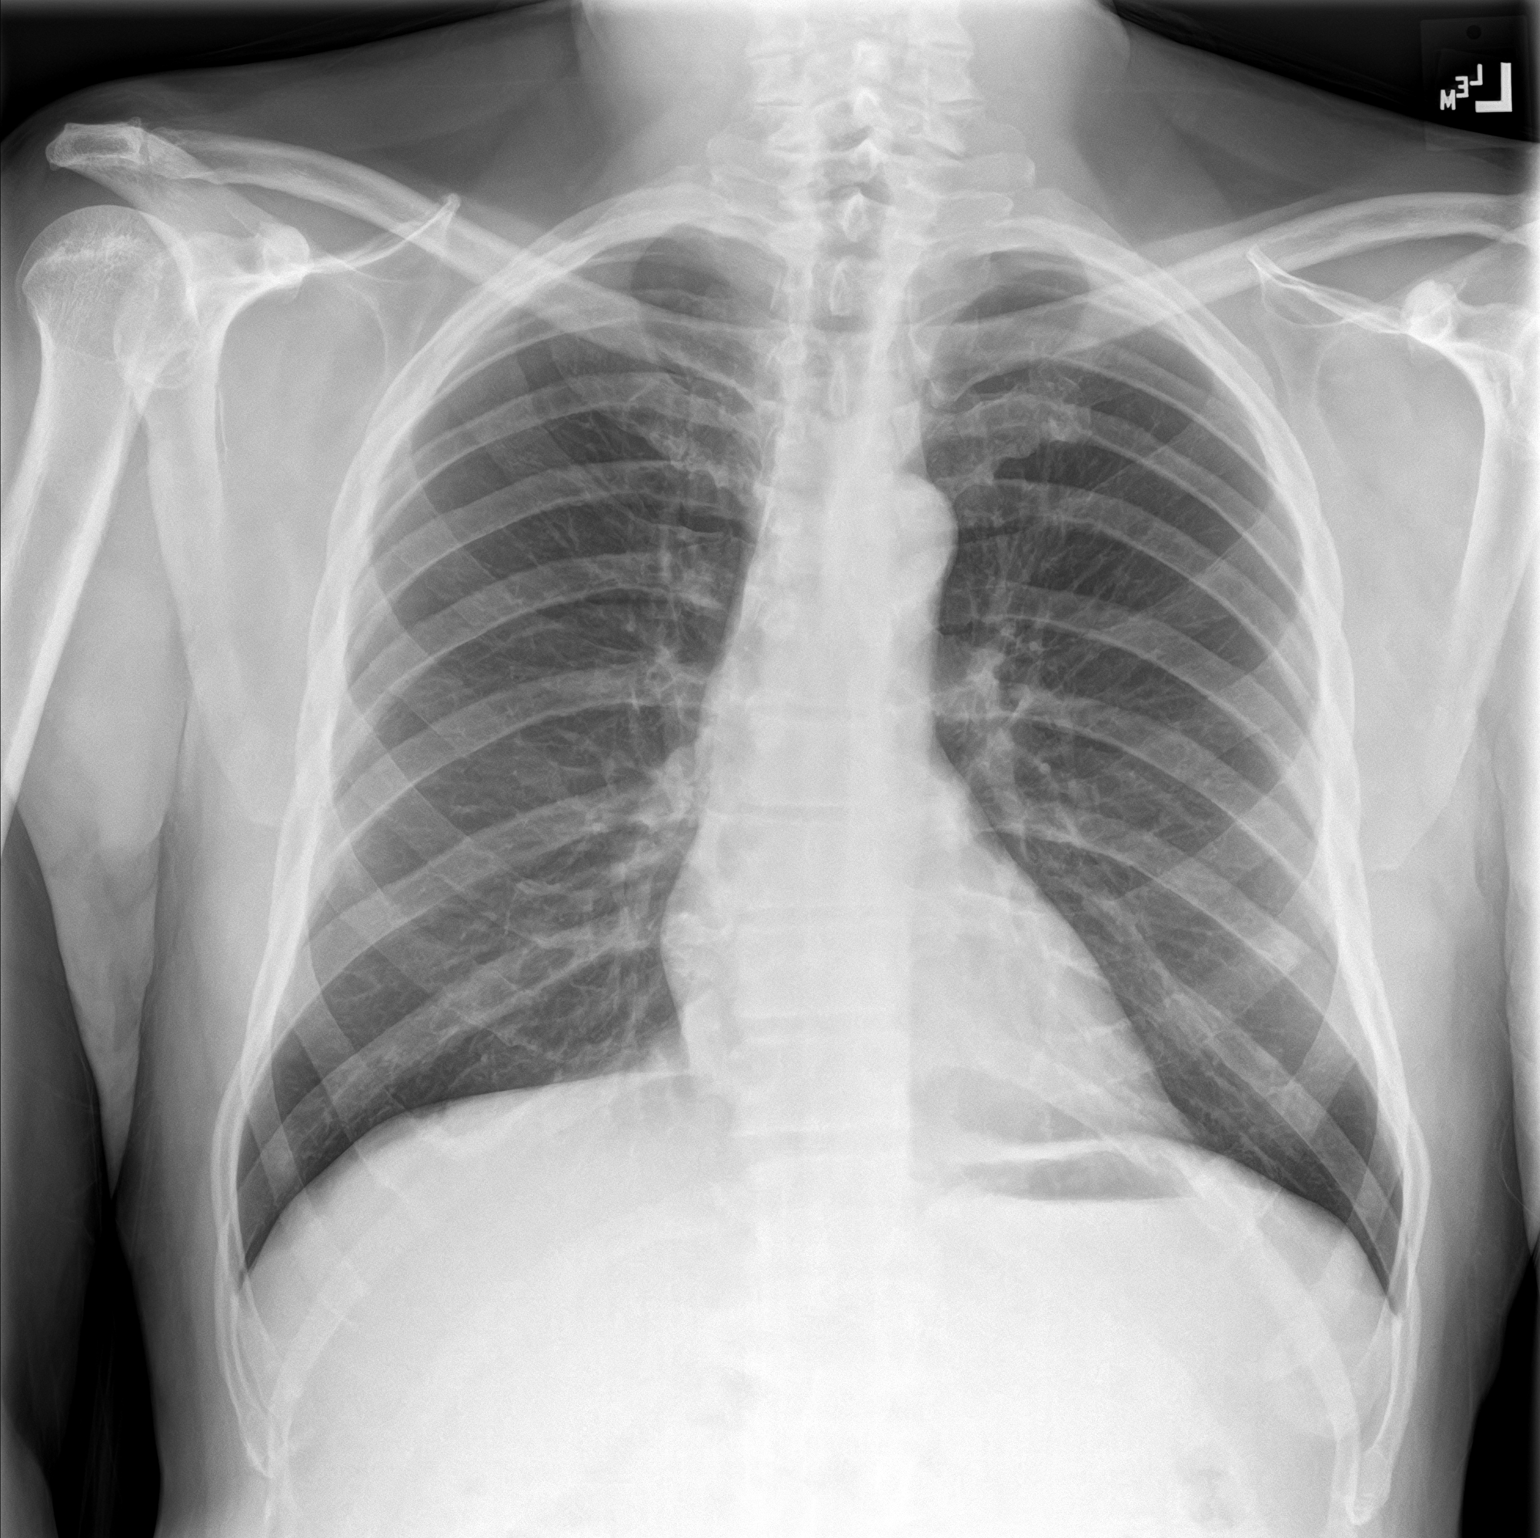
[im 2/2]
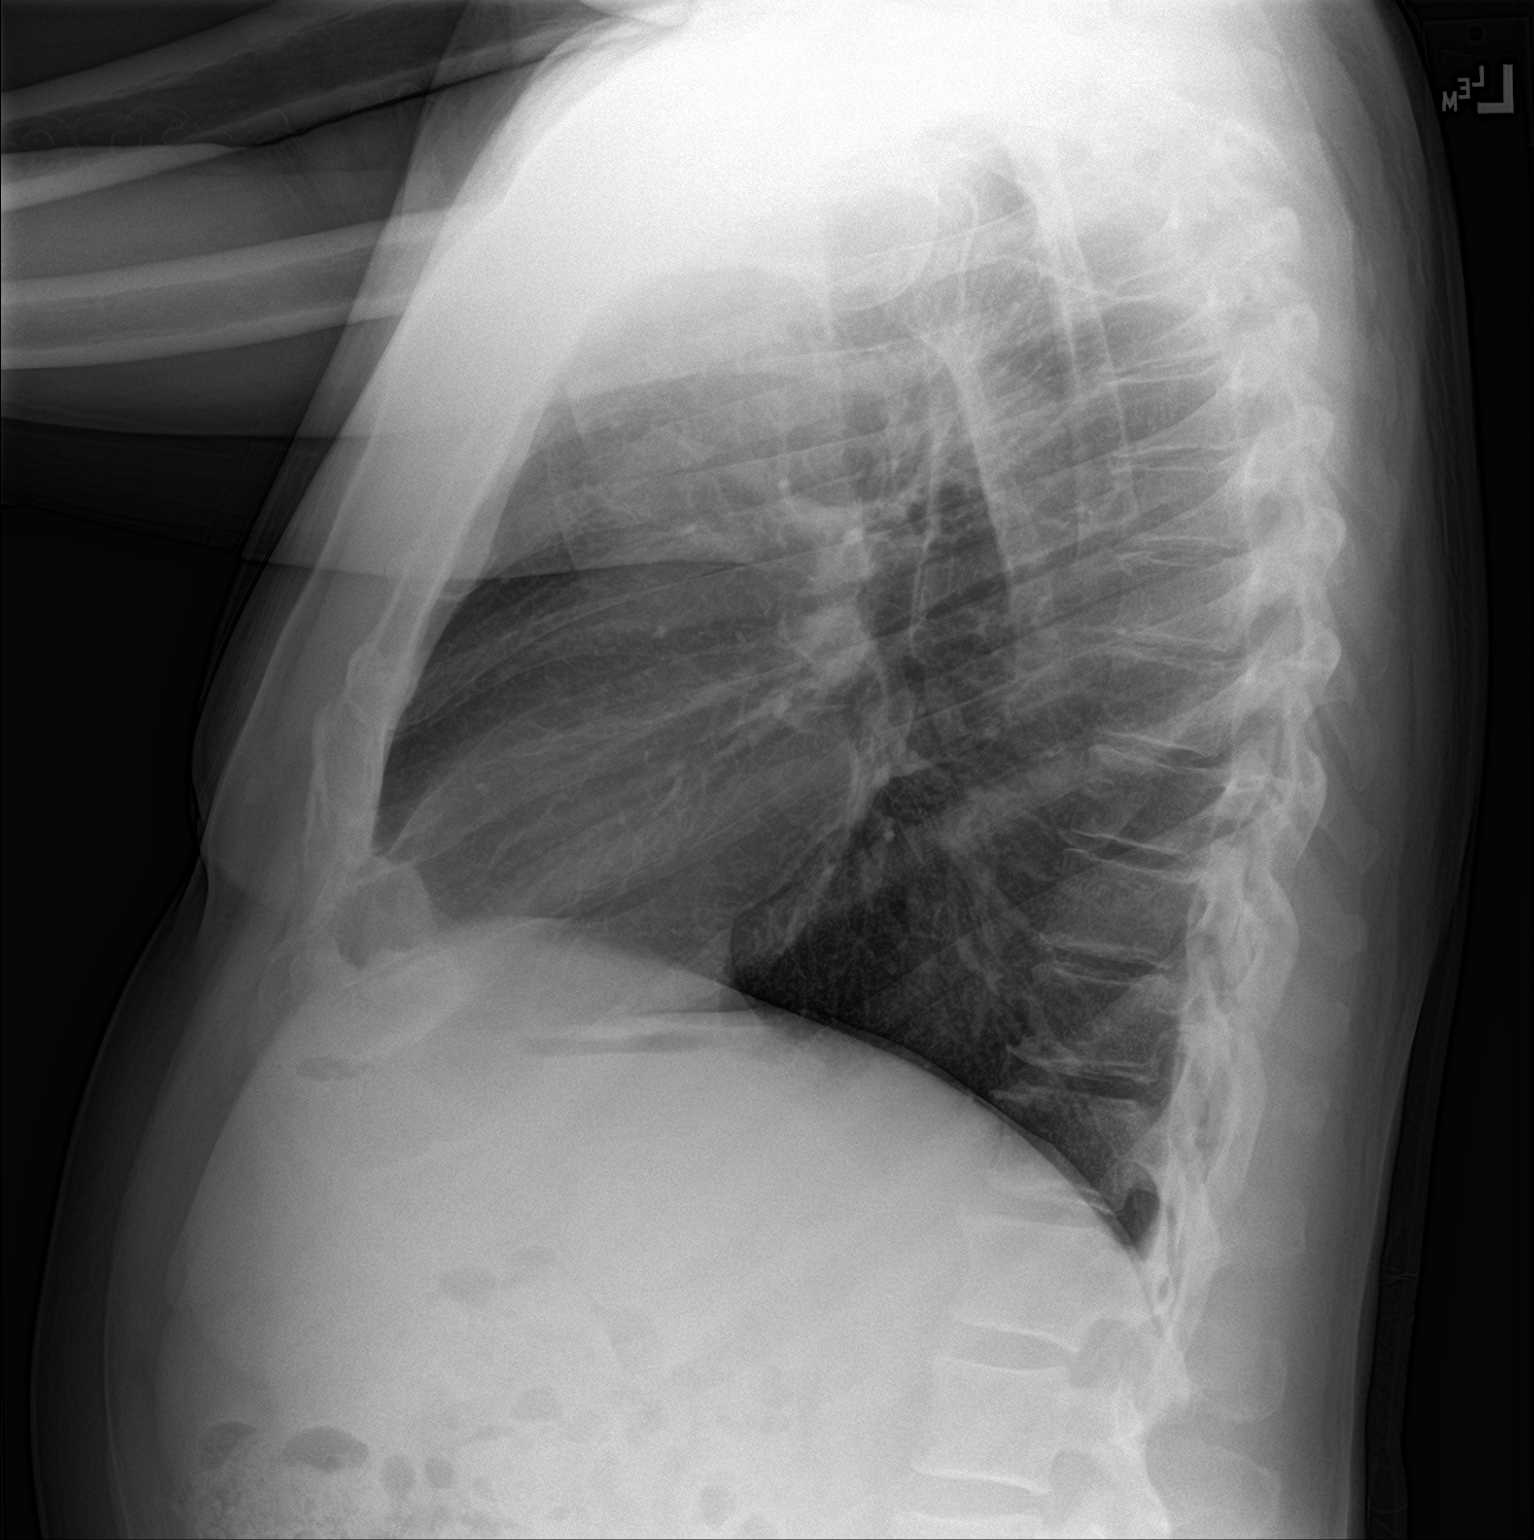

[2 of 2 positions shown; findings below may reference images not displayed]

FINDINGS: Cardiomediastinal silhouette unchanged in size and contour. No
pneumothorax. No pleural effusion. No confluent airspace disease.

No displaced fracture.  Mild scoliotic curvature of the spine.
IMPRESSION: Negative for acute cardiopulmonary disease

## 2022-05-15 ENCOUNTER — Other Ambulatory Visit: Payer: Self-pay | Admitting: Internal Medicine

## 2022-05-15 DIAGNOSIS — M0579 Rheumatoid arthritis with rheumatoid factor of multiple sites without organ or systems involvement: Secondary | ICD-10-CM

## 2022-05-15 NOTE — Progress Notes (Signed)
Results look fine for continuing methotrexate and CPR is normal.

## 2022-05-15 NOTE — Telephone Encounter (Signed)
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

## 2022-05-21 ENCOUNTER — Encounter: Payer: Medicare Other | Admitting: Occupational Therapy

## 2022-08-06 ENCOUNTER — Encounter: Payer: Self-pay | Admitting: Internal Medicine

## 2022-08-06 ENCOUNTER — Ambulatory Visit: Payer: Medicare Other | Attending: Internal Medicine | Admitting: Internal Medicine

## 2022-08-06 VITALS — BP 147/92 | HR 52 | Resp 16 | Ht 65.0 in | Wt 206.0 lb

## 2022-08-06 DIAGNOSIS — M720 Palmar fascial fibromatosis [Dupuytren]: Secondary | ICD-10-CM | POA: Diagnosis present

## 2022-08-06 DIAGNOSIS — Z79899 Other long term (current) drug therapy: Secondary | ICD-10-CM | POA: Diagnosis present

## 2022-08-06 DIAGNOSIS — M0579 Rheumatoid arthritis with rheumatoid factor of multiple sites without organ or systems involvement: Secondary | ICD-10-CM | POA: Diagnosis not present

## 2022-08-06 MED ORDER — METHOTREXATE SODIUM 2.5 MG PO TABS: 12.5000 mg | ORAL_TABLET | ORAL | 0 refills | Status: DC

## 2022-08-06 NOTE — Progress Notes (Signed)
Office Visit Note  Patient: Ralph Doyle             Date of Birth: [birth year]           MRN: [id number]             PCP: Lenard Simmer, MD Referring: Lenard Simmer, MD Visit Date: 08/06/2022   Subjective:  Follow-up   History of Present Illness: Ralph Doyle is a 72 y.o. 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 weekly and folic acid 1 mg daily.  Overall doing well without significant flareups.  He takes the turmeric consistently which is beneficial and as needed takes Tylenol not every day and usually up to once or twice if he is having some increased hand pain or anticipates more strenuous activity than usual.  Previous HPI 05/07/22 Ralph Doyle is a 72 y.o. 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  Overall symptoms are doing pretty well.  Working with occupational therapy has been beneficial for his hand pain and range of motion.  He is ended up not really trying the patch device he mentioned at the last visit I do not see significant clinical data to really recommend 1 way or the other.   Previous HPI 02/04/2022 Ralph Doyle is a 72 y.o. 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 He has not required any additional steroid treatments since our last visit. He had some ultrasound shockwave treatment for tendonitis symptoms with a good benefit. Physical therapy has improved shoulder pain and range of motion. He is trying a new Aeon patch device.    Previous HPI 10/30/2021 Ralph Doyle is a 72 y.o. male here for follow up for seropositive RA on MTX 55.7 mg PO weekly folic acid 1 mg daily tried tapering prednisone and trying turmeric supplementation.  Overall symptoms are doing fairly well. He has some pain in is left middle finger hands otherwise about the same as before. He is having pain at the base of the neck more than usual, going for massages these  temporarily help. Pain is somewhat worse with looking side to side.   Review of Systems  Constitutional: Negative.  Negative for fatigue.  HENT: Negative.  Negative for mouth sores and mouth dryness.   Eyes:  Positive for dryness.  Respiratory: Negative.  Negative for shortness of breath.   Cardiovascular: Negative.  Negative for chest pain and palpitations.  Gastrointestinal: Negative.  Negative for blood in stool, constipation and diarrhea.  Endocrine: Negative.  Negative for increased urination.  Genitourinary: Negative.  Negative for involuntary urination.  Musculoskeletal:  Positive for joint pain and joint pain. Negative for gait problem, joint swelling, myalgias, muscle weakness, morning stiffness, muscle tenderness and myalgias.  Skin:  Positive for rash. Negative for color change, hair loss and sensitivity to sunlight.  Allergic/Immunologic: Negative.  Negative for susceptible to infections.  Neurological: Negative.  Negative for dizziness and headaches.  Hematological:  Positive for swollen glands.  Psychiatric/Behavioral:  Positive for sleep disturbance. Negative for depressed mood. The patient is not nervous/anxious.     PMFS History:  Patient Active Problem List   Diagnosis Date Noted   Dupuytren's contracture of both hands 05/07/2022   Neck pain 10/30/2021   Positive colorectal cancer screening using Cologuard test    Polyp of transverse colon    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  Pain in right hand 10/12/2020   Pain of left hand 10/12/2020   Flexor tenosynovitis of finger 08/15/2020   Rheumatoid arthritis with rheumatoid factor of multiple sites without organ or systems involvement ([place]) 06/14/2020   High risk medication use 06/14/2020   Thyroid malignant neoplasm ([place]) 05/16/2020    Past Medical History:  Diagnosis Date   Cancer ([place])    Thyroid   Hyperlipidemia    Hypertension    Rheumatoid arthritis ([place])     Family History  Problem Relation Age  of Onset   Diabetes Mother    Hypertension Mother    Arthritis Mother    Diabetes Father    Hypertension Father    Diabetes Brother    Hypertension Brother    Hypertension Brother    Past Surgical History:  Procedure Laterality Date   COLONOSCOPY WITH PROPOFOL     COLONOSCOPY WITH PROPOFOL N/A 10/08/2021   Procedure: COLONOSCOPY WITH PROPOFOL;  Surgeon: Lucilla Lame, MD;  Location: [facility] ENDOSCOPY;  Service: Endoscopy;  Laterality: N/A;   MANDIBLE SURGERY     THYROIDECTOMY Bilateral 05/16/2020   Procedure: TOTAL THYROIDECTOMY;  Surgeon: Clyde Canterbury, MD;  Location: [facility] ORS;  Service: ENT;  Laterality: Bilateral;   Social History   Social History Narrative   Not on file   Immunization History  Administered Date(s) Administered   PFIZER(Purple Top)SARS-COV-2 Vaccination 08/25/2019, 09/20/2019     Objective: Vital Signs: BP (!) 147/92 (BP Location: Right Arm, Patient Position: Sitting, Cuff Size: Large)   Pulse (!) 52   Resp 16   Ht '5\' 5"'$  (1.651 m)   Wt 206 lb (93.4 kg)   BMI 34.28 kg/m    Physical Exam Eyes:     Comments: Bilateral pinguecula, faint conjunctival erythema no periorbital swelling  Cardiovascular:     Rate and Rhythm: Normal rate and regular rhythm.  Pulmonary:     Effort: Pulmonary effort is normal.     Breath sounds: Normal breath sounds.  Musculoskeletal:     Right lower leg: No edema.     Left lower leg: No edema.  Lymphadenopathy:     Cervical: No cervical adenopathy.  Skin:    General: Skin is warm and dry.     Findings: No rash.  Neurological:     Mental Status: He is alert.  Psychiatric:        Mood and Affect: Mood normal.      Musculoskeletal Exam:  Neck full ROM no tenderness Shoulders slight restricted overhead abduction ROM no focal tenderness Elbows full ROM no tenderness or swelling Wrists full ROM no tenderness or swelling Dupuytren contractures involving fourth and fifth fingers of both hands slightly more severe on the  left hand with decreased extension range of motion, no palpable synovitis or joint tenderness to pressure Knees full ROM no tenderness or swelling   CDAI Exam: CDAI Score: 2  Patient Global: 10 mm; Provider Global: 10 mm Swollen: 0 ; Tender: 0  Joint Exam 08/06/2022   All documented joints were normal     Investigation: No additional findings.  Imaging: No results found.  Recent Labs: Lab Results  Component Value Date   WBC 8.4 05/07/2022   HGB 15.7 05/07/2022   PLT 291 05/07/2022   NA 142 05/07/2022   K 4.5 05/07/2022   CL 104 05/07/2022   CO2 32 05/07/2022   GLUCOSE 64 (L) 05/07/2022   BUN 13 05/07/2022   CREATININE 1.22 05/07/2022   BILITOT 0.6 05/07/2022   AST 15  05/07/2022   ALT 17 05/07/2022   PROT 6.5 05/07/2022   CALCIUM 9.8 05/07/2022   GFRAA 69 11/06/2020    Speciality Comments: No specialty comments available.  Procedures:  No procedures performed Allergies: Patient has no known allergies.   Assessment / Plan:     Visit Diagnoses: Rheumatoid arthritis with rheumatoid factor of multiple sites without organ or systems involvement ([place]) - Plan: methotrexate (RHEUMATREX) 2.5 MG tablet, Sedimentation rate  Symptoms appear to be well-controlled he is experiencing occasional hand pain more use related especially symptoms localized around base of the thumb think is more related to some existing osteoarthritis.  Will recheck sedimentation rate for disease activity monitoring.  Will plan to continue the current methotrexate 12.5 mg p.o. weekly folic acid 1 mg daily.  Agree with continuing to use the turmeric discussed as needed Tylenol is fine as long as he is not taking maximal daily recommended doses alongside methotrexate.  High risk medication use - Plan: CBC with Differential/Platelet, COMPLETE METABOLIC PANEL WITH GFR  Checking CBC and CMP for methotrexate toxicity monitoring.  No significant interval infections.  Dupuytren's contracture of both  hands  Stable bilateral contractures without current pain or swelling today.  Orders: Orders Placed This Encounter  Procedures   Sedimentation rate   CBC with Differential/Platelet   COMPLETE METABOLIC PANEL WITH GFR   Meds ordered this encounter  Medications   methotrexate (RHEUMATREX) 2.5 MG tablet    Sig: Take 5 tablets (12.5 mg total) by mouth once a week. Caution:Chemotherapy. Protect from light.    Dispense:  65 tablet    Refill:  0     Follow-Up Instructions: Return in about 3 months (around 11/04/2022) for RA on MTX f/u 25mo.   CCollier Salina MD  Note - This record has been created using [employer]  Chart creation errors have been sought, but may not always  have been located. Such creation errors do not reflect on  the standard of medical care.

## 2022-08-11 LAB — CBC WITH DIFFERENTIAL/PLATELET
Absolute Monocytes: 616 cells/uL (ref 200–950)
Basophils Absolute: 62 cells/uL (ref 0–200)
Basophils Relative: 0.8 %
Eosinophils Absolute: 69 cells/uL (ref 15–500)
Eosinophils Relative: 0.9 %
HCT: 48.7 % (ref 38.5–50.0)
Hemoglobin: 16.7 g/dL (ref 13.2–17.1)
Lymphs Abs: 1879 cells/uL (ref 850–3900)
MCH: 32.9 pg (ref 27.0–33.0)
MCHC: 34.3 g/dL (ref 32.0–36.0)
MCV: 95.9 fL (ref 80.0–100.0)
MPV: 9.8 fL (ref 7.5–12.5)
Monocytes Relative: 8 %
Neutro Abs: 5074 cells/uL (ref 1500–7800)
Neutrophils Relative %: 65.9 %
Platelets: 265 10*3/uL (ref 140–400)
RBC: 5.08 10*6/uL (ref 4.20–5.80)
RDW: 14 % (ref 11.0–15.0)
Total Lymphocyte: 24.4 %
WBC: 7.7 10*3/uL (ref 3.8–10.8)

## 2022-08-11 LAB — SEDIMENTATION RATE

## 2022-08-11 LAB — COMPLETE METABOLIC PANEL WITH GFR
AG Ratio: 1.8 (calc) (ref 1.0–2.5)
ALT: 17 U/L (ref 9–46)
AST: 16 U/L (ref 10–35)
Albumin: 4.4 g/dL (ref 3.6–5.1)
Alkaline phosphatase (APISO): 62 U/L (ref 35–144)
BUN/Creatinine Ratio: 10 (calc) (ref 6–22)
BUN: 13 mg/dL (ref 7–25)
CO2: 32 mmol/L (ref 20–32)
Calcium: 10.2 mg/dL (ref 8.6–10.3)
Chloride: 104 mmol/L (ref 98–110)
Creat: 1.32 mg/dL — ABNORMAL HIGH (ref 0.70–1.28)
Globulin: 2.4 g/dL (calc) (ref 1.9–3.7)
Glucose, Bld: 87 mg/dL (ref 65–99)
Potassium: 4.7 mmol/L (ref 3.5–5.3)
Sodium: 141 mmol/L (ref 135–146)
Total Bilirubin: 0.7 mg/dL (ref 0.2–1.2)
Total Protein: 6.8 g/dL (ref 6.1–8.1)
eGFR: 58 mL/min/{1.73_m2} — ABNORMAL LOW (ref >=60)

## 2022-08-11 NOTE — Progress Notes (Signed)
The sedimentation rate test was drawn but test was never completed due to some kind of handling storage or transportation issue.  I do not think this is a big enough concern to need him coming for a separate lab draw we can monitor this when next following up.  Blood count is normal.  Metabolic panel shows the slight kidney impairment with estimated GFR of 58 no need to change methotrexate treatment.

## 2022-11-03 NOTE — Progress Notes (Unsigned)
Office Visit Note  Patient: Ralph Doyle             Date of Birth: 25-Mar-1951           MRN: [id number]             PCP: Alan Mulder, MD Referring: Alan Mulder, MD Visit Date: 11/04/2022   Subjective:  No chief complaint on file.   History of Present Illness: Kawai Deadmon is a 72 y.o. male here for follow up ***   Previous HPI 08/06/22 Raynel Marinelarena is a 72 y.o. 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 weekly and folic acid 1 mg daily.  Overall doing well without significant flareups.  He takes the turmeric consistently which is beneficial and as needed takes Tylenol not every day and usually up to once or twice if he is having some increased hand pain or anticipates more strenuous activity than usual.   Previous HPI 05/07/22 Freeland Leclair is a 72 y.o. 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  Overall symptoms are doing pretty well.  Working with occupational therapy has been beneficial for his hand pain and range of motion.  He is ended up not really trying the patch device he mentioned at the last visit I do not see significant clinical data to really recommend 1 way or the other.   Previous HPI 02/04/2022 Stas Blackledge is a 72 y.o. 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 He has not required any additional steroid treatments since our last visit. He had some ultrasound shockwave treatment for tendonitis symptoms with a good benefit. Physical therapy has improved shoulder pain and range of motion. He is trying a new Aeon patch device.    Previous HPI 10/30/2021 Tag Woitas is a 72 y.o. male here for follow up for seropositive RA on MTX 12.5 mg PO weekly folic acid 1 mg daily tried tapering prednisone and trying turmeric supplementation.  Overall symptoms are doing fairly well. He has some pain in is left middle finger hands otherwise about  the same as before. He is having pain at the base of the neck more than usual, going for massages these temporarily help. Pain is somewhat worse with looking side to side.   No Rheumatology ROS completed.   PMFS History:  Patient Active Problem List   Diagnosis Date Noted   Dupuytren's contracture of both hands 05/07/2022   Neck pain 10/30/2021   Positive colorectal cancer screening using Cologuard test    Polyp of transverse colon    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   Pain in right hand 10/12/2020   Pain of left hand 10/12/2020   Flexor tenosynovitis of finger 08/15/2020   Rheumatoid arthritis with rheumatoid factor of multiple sites without organ or systems involvement ([facility]) 06/14/2020   High risk medication use 06/14/2020   Thyroid malignant neoplasm ([facility]) 05/16/2020    Past Medical History:  Diagnosis Date   Cancer ([facility])    Thyroid   Hyperlipidemia    Hypertension    Rheumatoid arthritis ([facility])     Family History  Problem Relation Age of Onset   Diabetes Mother    Hypertension Mother    Arthritis Mother    Diabetes Father    Hypertension Father    Diabetes Brother    Hypertension Brother    Hypertension Brother  Past Surgical History:  Procedure Laterality Date   COLONOSCOPY WITH PROPOFOL     COLONOSCOPY WITH PROPOFOL N/A 10/08/2021   Procedure: COLONOSCOPY WITH PROPOFOL;  Surgeon: Midge Minium, MD;  Location[facility] ENDOSCOPY;  Service: Endoscopy;  Laterality: N/A;   MANDIBLE SURGERY     THYROIDECTOMY Bilateral 05/16/2020   Procedure: TOTAL THYROIDECTOMY;  Surgeon: Geanie Logan, MD;  Location: [facility] ORS;  Service: ENT;  Laterality: Bilateral;   Social History   Social History Narrative   Not on file   Immunization History  Administered Date(s) Administered   PFIZER(Purple Top)SARS-COV-2 Vaccination 08/25/2019, 09/20/2019     Objective: Vital Signs: There were no vitals taken for this visit.   Physical Exam   Musculoskeletal Exam: ***  CDAI  Exam: CDAI Score: -- Patient Global: --; Provider Global: -- Swollen: --; Tender: -- Joint Exam 11/04/2022   No joint exam has been documented for this visit   There is currently no information documented on the homunculus. Go to the Rheumatology activity and complete the homunculus joint exam.  Investigation: No additional findings.  Imaging: No results found.  Recent Labs: Lab Results  Component Value Date   WBC 7.7 08/06/2022   HGB 16.7 08/06/2022   PLT 265 08/06/2022   NA 141 08/06/2022   K 4.7 08/06/2022   CL 104 08/06/2022   CO2 32 08/06/2022   GLUCOSE 87 08/06/2022   BUN 13 08/06/2022   CREATININE 1.32 (H) 08/06/2022   BILITOT 0.7 08/06/2022   AST 16 08/06/2022   ALT 17 08/06/2022   PROT 6.8 08/06/2022   CALCIUM 10.2 08/06/2022   GFRAA 69 11/06/2020    Speciality Comments: No specialty comments available.  Procedures:  No procedures performed Allergies: Patient has no known allergies.   Assessment / Plan:     Visit Diagnoses: No diagnosis found.  ***  Orders: No orders of the defined types were placed in this encounter.  No orders of the defined types were placed in this encounter.    Follow-Up Instructions: No follow-ups on file.   Fuller Plan, MD  Note - This record has been created using [employer].  Chart creation errors have been sought, but may not always  have been located. Such creation errors do not reflect on  the standard of medical care.

## 2022-11-04 ENCOUNTER — Ambulatory Visit: Payer: Medicare Other | Attending: Internal Medicine | Admitting: Internal Medicine

## 2022-11-04 ENCOUNTER — Encounter: Payer: Self-pay | Admitting: Internal Medicine

## 2022-11-04 ENCOUNTER — Ambulatory Visit (INDEPENDENT_AMBULATORY_CARE_PROVIDER_SITE_OTHER): Payer: Medicare Other

## 2022-11-04 VITALS — BP 138/89 | HR 60 | Resp 16 | Ht 68.5 in | Wt 205.0 lb

## 2022-11-04 DIAGNOSIS — M654 Radial styloid tenosynovitis [de Quervain]: Secondary | ICD-10-CM | POA: Diagnosis not present

## 2022-11-04 DIAGNOSIS — M0579 Rheumatoid arthritis with rheumatoid factor of multiple sites without organ or systems involvement: Secondary | ICD-10-CM | POA: Insufficient documentation

## 2022-11-04 DIAGNOSIS — Z79899 Other long term (current) drug therapy: Secondary | ICD-10-CM | POA: Insufficient documentation

## 2022-11-04 DIAGNOSIS — M79641 Pain in right hand: Secondary | ICD-10-CM

## 2022-11-04 LAB — CBC WITH DIFFERENTIAL/PLATELET
Absolute Monocytes: 672 cells/uL (ref 200–950)
Basophils Relative: 0.4 %
Eosinophils Absolute: 64 cells/uL (ref 15–500)
MCHC: 33.6 g/dL (ref 32.0–36.0)
MCV: 95.2 fL (ref 80.0–100.0)
MPV: 9.8 fL (ref 7.5–12.5)
Neutro Abs: 5768 cells/uL (ref 1500–7800)
Total Lymphocyte: 18.3 %

## 2022-11-04 MED ORDER — TRIAMCINOLONE ACETONIDE 40 MG/ML IJ SUSP
30.0000 mg | INTRAMUSCULAR | Status: AC | PRN
Start: 2022-11-04 — End: 2022-11-04
  Administered 2022-11-04: 30 mg

## 2022-11-04 MED ORDER — LIDOCAINE HCL 1 % IJ SOLN
0.7500 mL | INTRAMUSCULAR | Status: AC | PRN
Start: 2022-11-04 — End: 2022-11-04
  Administered 2022-11-04: .75 mL

## 2022-11-04 MED ORDER — METHOTREXATE SODIUM 2.5 MG PO TABS: 12.5000 mg | ORAL_TABLET | ORAL | 0 refills | Status: DC

## 2022-11-05 LAB — CBC WITH DIFFERENTIAL/PLATELET
Basophils Absolute: 32 cells/uL (ref 0–200)
Eosinophils Relative: 0.8 %
HCT: 45.2 % (ref 38.5–50.0)
Hemoglobin: 15.2 g/dL (ref 13.2–17.1)
Lymphs Abs: 1464 cells/uL (ref 850–3900)
MCH: 32 pg (ref 27.0–33.0)
Monocytes Relative: 8.4 %
Neutrophils Relative %: 72.1 %
Platelets: 273 10*3/uL (ref 140–400)
RBC: 4.75 10*6/uL (ref 4.20–5.80)
RDW: 14.6 % (ref 11.0–15.0)
WBC: 8 10*3/uL (ref 3.8–10.8)

## 2022-11-05 LAB — COMPLETE METABOLIC PANEL WITH GFR
AG Ratio: 2.1 (calc) (ref 1.0–2.5)
ALT: 13 U/L (ref 9–46)
AST: 12 U/L (ref 10–35)
Albumin: 4.1 g/dL (ref 3.6–5.1)
Alkaline phosphatase (APISO): 60 U/L (ref 35–144)
BUN: 12 mg/dL (ref 7–25)
CO2: 25 mmol/L (ref 20–32)
Calcium: 9.7 mg/dL (ref 8.6–10.3)
Chloride: 107 mmol/L (ref 98–110)
Creat: 1.22 mg/dL (ref 0.70–1.28)
Globulin: 2 g/dL (calc) (ref 1.9–3.7)
Glucose, Bld: 80 mg/dL (ref 65–99)
Potassium: 4 mmol/L (ref 3.5–5.3)
Sodium: 141 mmol/L (ref 135–146)
Total Bilirubin: 0.5 mg/dL (ref 0.2–1.2)
Total Protein: 6.1 g/dL (ref 6.1–8.1)
eGFR: 64 mL/min/{1.73_m2} (ref >=60)

## 2022-11-05 LAB — SEDIMENTATION RATE: Sed Rate: 2 mm/h (ref 0–20)

## 2022-11-05 NOTE — Progress Notes (Signed)
Lab results all look fine for the methotrexate and no evidence of a RA flare up. Hopefully the wrist injection improves that pain and swelling, he should continue to try wearing a brace or glove and going easy on it for about 2 weeks.

## 2023-01-22 ENCOUNTER — Other Ambulatory Visit: Payer: Self-pay | Admitting: Internal Medicine

## 2023-01-22 DIAGNOSIS — M0579 Rheumatoid arthritis with rheumatoid factor of multiple sites without organ or systems involvement: Secondary | ICD-10-CM

## 2023-01-22 NOTE — Telephone Encounter (Signed)
Last Fill: 11/04/2022  Labs: 11/04/2022 Lab results all look fine for the methotrexate and no evidence of a RA flare up. Hopefully the wrist injection improves that pain and swelling, he should continue to try wearing a brace or glove and going easy on it for about 2 weeks.   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 weekly   Okay to refill Methotrexate?

## 2023-01-28 NOTE — Progress Notes (Signed)
Office Visit Note  Patient: Ralph Doyle             Date of Birth: [birth year]           MRN: [id number]             PCP: Alan Mulder, MD Referring: Alan Mulder, MD Visit Date: 02/04/2023   Subjective:  Follow-up (Patient states he has an area of discoloration on his right wrist. )   History of Present Illness: Ralph Doyle is a 72 y.o. 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 weekly and folic acid 1 mg daily.  After our last visit right wrist pain did improve with steroid injection though he did not wear the wrist brace as discussed at the time.  Currently symptoms are doing well at about baseline no major flareup of increased swelling or limitations.  Does get increased hand pain with prolonged guitar playing.  He developed a area of lighter pigmentation at his wrist where we did the injection in May.  Previous HPI 11/04/2022 Ralph Doyle is a 72 y.o. 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 weekly and folic acid 1 mg daily.  Currently has increased pain in the right wrist is been ongoing for about 4 to 5 weeks.  He thinks it may be associated with looking after his grandchildren since he notices some increased pain while lifting them.  There is a small amount of associated swelling.  Otherwise has some shoulder pain more on left but not in particular exacerbation.   Previous HPI 08/06/22 Ralph Doyle is a 72 y.o. 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 weekly and folic acid 1 mg daily.  Overall doing well without significant flareups.  He takes the turmeric consistently which is beneficial and as needed takes Tylenol not every day and usually up to once or twice if he is having some increased hand pain or anticipates more strenuous activity than usual.   Previous HPI 05/07/22 Ralph Doyle is a 72 y.o. male here for follow up for seropositive RA on MTX 12.5 mg PO weekly  and folic acid 1 mg daily.  Overall symptoms are doing pretty well.  Working with occupational therapy has been beneficial for his hand pain and range of motion.  He is ended up not really trying the patch device he mentioned at the last visit I do not see significant clinical data to really recommend 1 way or the other.   Previous HPI 02/04/2022 Ralph Doyle is a 72 y.o. 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 He has not required any additional steroid treatments since our last visit. He had some ultrasound shockwave treatment for tendonitis symptoms with a good benefit. Physical therapy has improved shoulder pain and range of motion. He is trying a new Aeon patch device.    Previous HPI 10/30/2021 Ralph Doyle is a 72 y.o. male here for follow up for seropositive RA on MTX 12.5 mg PO weekly folic acid 1 mg daily tried tapering prednisone and trying turmeric supplementation.  Overall symptoms are doing fairly well. He has some pain in is left middle finger hands otherwise about the same as before. He is having pain at the base of the neck more than usual, going for massages these temporarily help. Pain is somewhat worse with looking side to side.   Review of Systems  Constitutional:  Negative for fatigue.  HENT:  Negative for mouth sores and mouth dryness.   Eyes:  Positive for dryness.  Respiratory:  Negative for shortness of breath.   Cardiovascular:  Negative for chest pain and palpitations.  Gastrointestinal:  Negative for blood in stool, constipation and diarrhea.  Endocrine: Positive for increased urination.  Genitourinary:  Negative for involuntary urination.  Musculoskeletal:  Positive for joint pain, joint pain, myalgias and myalgias. Negative for gait problem, joint swelling, muscle weakness, morning stiffness and muscle tenderness.  Skin:  Positive for color change. Negative for rash, hair loss and sensitivity to sunlight.   Allergic/Immunologic: Negative for susceptible to infections.  Neurological:  Negative for dizziness and headaches.  Hematological:  Negative for swollen glands.  Psychiatric/Behavioral:  Positive for depressed mood and sleep disturbance. The patient is nervous/anxious.     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   Pain in right hand 10/12/2020   Pain of left hand 10/12/2020   Flexor tenosynovitis of finger 08/15/2020   Rheumatoid arthritis with rheumatoid factor of multiple sites without organ or systems involvement (HCC) 06/14/2020   High risk medication use 06/14/2020   Thyroid malignant neoplasm (HCC) 05/16/2020    Past Medical History:  Diagnosis Date   Cancer (HCC)    Thyroid   Hyperlipidemia    Hypertension    Rheumatoid arthritis (HCC)     Family History  Problem Relation Age of Onset   Diabetes Mother    Hypertension Mother    Arthritis Mother    Diabetes Father    Hypertension Father    Diabetes Brother    Hypertension Brother    Hypertension Brother    Past Surgical History:  Procedure Laterality Date   COLONOSCOPY WITH PROPOFOL     COLONOSCOPY WITH PROPOFOL N/A 10/08/2021   Procedure: COLONOSCOPY WITH PROPOFOL;  Surgeon: Midge Minium, MD;  Location: [facility] ENDOSCOPY;  Service: Endoscopy;  Laterality: N/A;   MANDIBLE SURGERY     THYROIDECTOMY Bilateral 05/16/2020   Procedure: TOTAL THYROIDECTOMY;  Surgeon: Geanie Logan, MD;  Location: [facility] ORS;  Service: ENT;  Laterality: Bilateral;   Social History   Social History Narrative   Not on file   Immunization History  Administered Date(s) Administered   PFIZER(Purple Top)SARS-COV-2 Vaccination 08/25/2019, 09/20/2019      Objective: Vital Signs: BP (!) 147/81 (BP Location: Left Arm, Patient Position: Sitting, Cuff Size: Normal)   Pulse (!) 55   Resp 16   Ht 5' 8.5" (1.74 m)   Wt 200 lb (90.7 kg)   BMI 29.97 kg/m    Physical Exam Eyes:     Conjunctiva/sclera: Conjunctivae normal.  Cardiovascular:     Rate and Rhythm: Normal rate and regular rhythm.  Pulmonary:     Effort: Pulmonary effort is normal.     Breath sounds: Normal breath sounds.  Lymphadenopathy:     Cervical: No cervical adenopathy.  Skin:    General: Skin is warm and dry.  Neurological:     Mental Status: He is alert.  Psychiatric:        Mood and Affect: Mood normal.      Musculoskeletal Exam:  Shoulders full ROM no tenderness or swelling Elbows full  ROM no tenderness or swelling Wrists full ROM no tenderness or swelling Fingers with dupuytren's contractures 4th-5th fingers on both hands worse on left, slightly limited extension ROM, no palpable swelling Knees full ROM no tenderness or swelling, left knee patellofemoral crepitus   CDAI Exam: CDAI Score: 2  Patient Global: 10 / 100; Provider Global: 10 / 100 Swollen: 0 ; Tender: 0  Joint Exam 02/04/2023   All documented joints were normal     Investigation: No additional findings.  Imaging: No results found.  Recent Labs: Lab Results  Component Value Date   WBC 8.0 11/04/2022   HGB 15.2 11/04/2022   PLT 273 11/04/2022   NA 141 11/04/2022   K 4.0 11/04/2022   CL 107 11/04/2022   CO2 25 11/04/2022   GLUCOSE 80 11/04/2022   BUN 12 11/04/2022   CREATININE 1.22 11/04/2022   BILITOT 0.5 11/04/2022   AST 12 11/04/2022   ALT 13 11/04/2022   PROT 6.1 11/04/2022   CALCIUM 9.7 11/04/2022   GFRAA 69 11/06/2020    Speciality Comments: No specialty comments available.  Procedures:  No procedures performed Allergies: Patient has no known allergies.   Assessment / Plan:     Visit Diagnoses: Rheumatoid arthritis with rheumatoid factor of multiple sites  without organ or systems involvement (HCC) - Plan: Sedimentation rate  Symptoms appear very well today just has some residual osteoarthritis and tendon contractures.  Checking sed rate for disease activity monitoring.  Plan to continue on methotrexate 12.5 mg p.o. weekly and folic acid 1 mg daily.  High risk medication use - methotrexate 12.5 mg p.o. weekly and folic acid 1 mg daily. - Plan: CBC with Differential/Platelet, COMPLETE METABOLIC PANEL WITH GFR  No serious interval infections.  Checking CBC and CMP for medication monitoring and long-term use of methotrexate.  Dupuytren's contracture of both hands  No significant change in nodule contracture is not limiting activity significantly at this time.  Radial styloid tenosynovitis (de quervain)  Symptoms resolved since her last visit.  Did get residual hypopigmentation at injection site discussed this is common side effect of steroid injection anticipate improvement over next few months.  Prescription also ask about needing to establish care with new primary care provider as his previous doctor Patrecia Pace is planning to retire.  He follows up with them for management of hypertension and hyperlipidemia as well as regular healthcare screening.  Provided with contact information to set up with [facility] family practice new patient appointment scheduled for October.  Orders: Orders Placed This Encounter  Procedures   Sedimentation rate   CBC with Differential/Platelet   COMPLETE METABOLIC PANEL WITH GFR   No orders of the defined types were placed in this encounter.    Follow-Up Instructions: Return in about 3 months (around 05/07/2023) for RA on MTX f/u 3mos.   Fuller Plan, MD  Note - This record has been created using [employer].  Chart creation errors have been sought, but may not always  have been located. Such creation errors do not reflect on  the standard of medical care.

## 2023-02-04 ENCOUNTER — Ambulatory Visit: Payer: Medicare Other | Attending: Internal Medicine | Admitting: Internal Medicine

## 2023-02-04 ENCOUNTER — Encounter: Payer: Self-pay | Admitting: Internal Medicine

## 2023-02-04 VITALS — BP 163/89 | HR 53 | Resp 16 | Ht 68.5 in | Wt 200.0 lb

## 2023-02-04 DIAGNOSIS — M0579 Rheumatoid arthritis with rheumatoid factor of multiple sites without organ or systems involvement: Secondary | ICD-10-CM | POA: Insufficient documentation

## 2023-02-04 DIAGNOSIS — M79641 Pain in right hand: Secondary | ICD-10-CM | POA: Diagnosis present

## 2023-02-04 DIAGNOSIS — Z79899 Other long term (current) drug therapy: Secondary | ICD-10-CM | POA: Insufficient documentation

## 2023-02-04 DIAGNOSIS — M720 Palmar fascial fibromatosis [Dupuytren]: Secondary | ICD-10-CM | POA: Diagnosis present

## 2023-02-04 DIAGNOSIS — M654 Radial styloid tenosynovitis [de Quervain]: Secondary | ICD-10-CM | POA: Diagnosis present

## 2023-02-04 LAB — CBC WITH DIFFERENTIAL/PLATELET
Absolute Monocytes: 748 cells/uL (ref 200–950)
Basophils Absolute: 53 cells/uL (ref 0–200)
Basophils Relative: 0.6 %
Eosinophils Absolute: 44 cells/uL (ref 15–500)
Eosinophils Relative: 0.5 %
HCT: 48.8 % (ref 38.5–50.0)
Hemoglobin: 16.4 g/dL (ref 13.2–17.1)
Lymphs Abs: 1874 cells/uL (ref 850–3900)
MCH: 32.6 pg (ref 27.0–33.0)
MCHC: 33.6 g/dL (ref 32.0–36.0)
MCV: 97 fL (ref 80.0–100.0)
MPV: 9.8 fL (ref 7.5–12.5)
Monocytes Relative: 8.5 %
Neutro Abs: 6081 cells/uL (ref 1500–7800)
Neutrophils Relative %: 69.1 %
Platelets: 276 10*3/uL (ref 140–400)
RBC: 5.03 10*6/uL (ref 4.20–5.80)
RDW: 14 % (ref 11.0–15.0)
Total Lymphocyte: 21.3 %
WBC: 8.8 10*3/uL (ref 3.8–10.8)

## 2023-02-04 LAB — SEDIMENTATION RATE: Sed Rate: 2 mm/h (ref 0–20)

## 2023-04-03 ENCOUNTER — Emergency Department
Admission: EM | Admit: 2023-04-03 | Discharge: 2023-04-03 | Disposition: A | Discharge status: Home / Self Care | Payer: Medicare Other | Attending: Emergency Medicine | Admitting: Emergency Medicine

## 2023-04-03 ENCOUNTER — Encounter: Payer: Self-pay | Admitting: Emergency Medicine

## 2023-04-03 ENCOUNTER — Other Ambulatory Visit: Payer: Self-pay

## 2023-04-03 DIAGNOSIS — Z85850 Personal history of malignant neoplasm of thyroid: Secondary | ICD-10-CM | POA: Insufficient documentation

## 2023-04-03 DIAGNOSIS — Z23 Encounter for immunization: Secondary | ICD-10-CM | POA: Insufficient documentation

## 2023-04-03 DIAGNOSIS — H209 Unspecified iridocyclitis: Secondary | ICD-10-CM | POA: Insufficient documentation

## 2023-04-03 DIAGNOSIS — H20039 Secondary infectious iridocyclitis, unspecified eye: Secondary | ICD-10-CM

## 2023-04-03 DIAGNOSIS — I10 Essential (primary) hypertension: Secondary | ICD-10-CM | POA: Diagnosis not present

## 2023-04-03 DIAGNOSIS — H1131 Conjunctival hemorrhage, right eye: Secondary | ICD-10-CM | POA: Insufficient documentation

## 2023-04-03 DIAGNOSIS — H5789 Other specified disorders of eye and adnexa: Secondary | ICD-10-CM | POA: Diagnosis present

## 2023-04-03 DIAGNOSIS — M25512 Pain in left shoulder: Secondary | ICD-10-CM | POA: Insufficient documentation

## 2023-04-03 MED ORDER — TETANUS-DIPHTH-ACELL PERTUSSIS 5-2.5-18.5 LF-MCG/0.5 IM SUSY
0.5000 mL | PREFILLED_SYRINGE | Freq: Once | INTRAMUSCULAR | Status: AC
  Administered 2023-04-03: 0.5 mL via INTRAMUSCULAR
  Filled 2023-04-03: qty 0.5

## 2023-04-03 MED ORDER — TOBRAMYCIN 0.3 % OP SOLN
2.0000 [drp] | OPHTHALMIC | 0 refills | Status: DC
Start: 2023-04-03 — End: 2023-04-30

## 2023-04-03 MED ORDER — TETRACAINE HCL 0.5 % OP SOLN
2.0000 [drp] | Freq: Once | OPHTHALMIC | Status: AC
  Administered 2023-04-03: 2 [drp] via OPHTHALMIC
  Filled 2023-04-03: qty 4

## 2023-04-03 MED ORDER — FLUORESCEIN SODIUM 1 MG OP STRP
1.0000 | ORAL_STRIP | Freq: Once | OPHTHALMIC | Status: AC
  Administered 2023-04-03: 1 via OPHTHALMIC
  Filled 2023-04-03: qty 1

## 2023-04-03 MED ORDER — PREDNISOLONE ACETATE 1 % OP SUSP: 1.0000 [drp] | Freq: Four times a day (QID) | OPHTHALMIC | 0 refills | Status: AC

## 2023-04-03 NOTE — ED Triage Notes (Signed)
Pt with bilateral red eyes that started yesterday. Pt denies itching, pain, running. Pt does take drops for chronic dry eyes. Pt did have dirt in eyes Tuesday.

## 2023-04-03 NOTE — ED Provider Notes (Signed)
[facility] Provider Note    Event Date/Time   First MD Initiated Contact with Patient 04/03/23 1123     (approximate)   History   Eye Problem   HPI  Ralph Doyle is a 72 y.o. male history of hypertension, hyperlipidemia, thyroid cancer and rheumatoid arthritis presents emergency department with right eye redness.  Patient states that he played golf yesterday and got dirt in his eye.  Unsure which eye.  States he thought it was the left but the right is the one that is red.  No change in his vision.  No headache.  States has chronic left shoulder pain.  No chest pain or shortness of breath      Physical Exam   Triage Vital Signs: ED Triage Vitals  Encounter Vitals Group     BP 04/03/23 1102 (!) 161/98     Systolic BP Percentile --      Diastolic BP Percentile --      Pulse Rate 04/03/23 1102 60     Resp 04/03/23 1102 16     Temp 04/03/23 1102 98.6 F (37 C)     Temp Source 04/03/23 1102 Oral     SpO2 04/03/23 1102 100 %     Weight 04/03/23 1103 200 lb (90.7 kg)     Height 04/03/23 1103 5\' 8"  (1.727 m)     Head Circumference --      Peak Flow --      Pain Score 04/03/23 1103 0     Pain Loc --      Pain Education --      Exclude from Growth Chart --     Most recent vital signs: Vitals:   04/03/23 1102  BP: (!) 161/98  Pulse: 60  Resp: 16  Temp: 98.6 F (37 C)  SpO2: 100%     General: Awake, no distress.   CV:  Good peripheral perfusion. regular rate and  rhythm Resp:  Normal effort.  Abd:  No distention.   Other:  Right eye is injected at the conjunctiva laterally, fluorescein stain does not show any dye uptake   ED Results / Procedures / Treatments   Labs (all labs ordered are listed, but only abnormal results are displayed) Labs Reviewed - No data to display   EKG     RADIOLOGY     PROCEDURES:   Procedures   MEDICATIONS ORDERED IN ED: Medications  tetracaine (PONTOCAINE) 0.5 % ophthalmic solution 2  drop (2 drops Left Eye Given 04/03/23 1132)  fluorescein ophthalmic strip 1 strip (1 strip Left Eye Given 04/03/23 1132)  Tdap (BOOSTRIX) injection 0.5 mL (0.5 mLs Intramuscular Given 04/03/23 1132)     IMPRESSION / MDM / ASSESSMENT AND PLAN / ED COURSE  I reviewed the triage vital signs and the nursing notes.                              Differential diagnosis includes, but is not limited to, corneal abrasion, subconjunctival hemorrhage, iritis, uveitis  Patient's presentation is most consistent with acute illness / injury with system symptoms.   Feel the patient more than likely has either iritis versus uveitis or the beginnings of a subconjunctival hemorrhage.  No change in his vision.  He can follow-up with his regular ophthalmologist next week.  Did start him on tobramycin ophthalmic drops and prednisolone ophthalmic drops.  He is in agreement treatment plan.  Discharged stable condition.  FINAL CLINICAL IMPRESSION(S) / ED DIAGNOSES   Final diagnoses:  Subconjunctival hemorrhage of right eye  Iritis due to infection     Rx / DC Orders   ED Discharge Orders          Ordered    tobramycin (TOBREX) 0.3 % ophthalmic solution  Every 4 hours        04/03/23 1152    prednisoLONE acetate (PRED FORTE) 1 % ophthalmic suspension  4 times daily        04/03/23 1155             Note:  This document was prepared using Dragon voice recognition software and may include unintentional dictation errors.    Faythe Ghee, PA-C 04/03/23 1340    Dionne Bucy, MD 04/03/23 2027

## 2023-04-07 ENCOUNTER — Encounter: Payer: Self-pay | Admitting: Physician Assistant

## 2023-04-07 ENCOUNTER — Ambulatory Visit (INDEPENDENT_AMBULATORY_CARE_PROVIDER_SITE_OTHER): Payer: Medicare Other | Admitting: Physician Assistant

## 2023-04-07 VITALS — BP 134/84 | HR 66 | Temp 98.1°F | Ht 68.0 in | Wt 200.9 lb

## 2023-04-07 DIAGNOSIS — E7849 Other hyperlipidemia: Secondary | ICD-10-CM | POA: Diagnosis not present

## 2023-04-07 DIAGNOSIS — I10 Essential (primary) hypertension: Secondary | ICD-10-CM | POA: Diagnosis not present

## 2023-04-07 DIAGNOSIS — Z7689 Persons encountering health services in other specified circumstances: Secondary | ICD-10-CM | POA: Diagnosis not present

## 2023-04-07 DIAGNOSIS — Z833 Family history of diabetes mellitus: Secondary | ICD-10-CM

## 2023-04-07 DIAGNOSIS — M069 Rheumatoid arthritis, unspecified: Secondary | ICD-10-CM

## 2023-04-07 DIAGNOSIS — I493 Ventricular premature depolarization: Secondary | ICD-10-CM

## 2023-04-07 DIAGNOSIS — E890 Postprocedural hypothyroidism: Secondary | ICD-10-CM | POA: Diagnosis not present

## 2023-04-07 DIAGNOSIS — E538 Deficiency of other specified B group vitamins: Secondary | ICD-10-CM

## 2023-04-07 DIAGNOSIS — N289 Disorder of kidney and ureter, unspecified: Secondary | ICD-10-CM

## 2023-04-07 NOTE — Progress Notes (Unsigned)
New patient visit  Patient: Ralph Doyle   DOB: May 11, 1951   72 y.o. Male  MRN: [id number] Visit Date: 04/07/2023  Today's healthcare provider: Debera Lat, PA-C   Chief Complaint  Patient presents with   Establish Care   Subjective    Ralph Doyle is a 72 y.o. male who presents today as a new patient to establish care.   Discussed the use of AI scribe software for clinical note transcription with the patient, who gave verbal consent to proceed.  History of Present Illness   The patient, with a history of thyroid cancer, hyperthyroidism, high blood pressure, high cholesterol, and rheumatoid arthritis, is establishing care with a new primary care physician after his previous doctor retired. He is currently taking Levothyroxine daily due to a thyroidectomy and Methotrexate once a week for rheumatoid arthritis. He reports no current issues with his rheumatoid arthritis and is able to perform daily activities such as mowing the lawn and playing golf. He recently visited the emergency room due to an eye injury from playing golf but did not require a change in his prescription for reading glasses. The patient also mentions a family history of diabetes, with both parents and a brother having the condition. However, he has not been diagnosed with diabetes.        Past Medical History:  Diagnosis Date   Cancer (HCC)    Thyroid   Hyperlipidemia    Hypertension    Rheumatoid arthritis (HCC)    Past Surgical History:  Procedure Laterality Date   COLONOSCOPY WITH PROPOFOL     COLONOSCOPY WITH PROPOFOL N/A 10/08/2021   Procedure: COLONOSCOPY WITH PROPOFOL;  Surgeon: Midge Minium, MD;  Location: [facility] ENDOSCOPY;  Service: Endoscopy;  Laterality: N/A;   MANDIBLE SURGERY     THYROIDECTOMY Bilateral 05/16/2020   Procedure: TOTAL THYROIDECTOMY;  Surgeon: Geanie Logan, MD;  Location: [facility] ORS;  Service: ENT;  Laterality: Bilateral;   Family Status  Relation Name Status   Mother  Deceased    Father  Deceased   Brother Barbee [place] Alive   Brother Fredrik [place] Deceased   Brother [place] Deceased  No partnership data on file   Family History  Problem Relation Age of Onset   Diabetes Mother    Hypertension Mother    Arthritis Mother    Diabetes Father    Hypertension Father    Diabetes Brother    Hypertension Brother    Hypertension Brother    Social History   Socioeconomic History   Marital status: Married    Spouse name: Not on file   Number of children: Not on file   Years of education: Not on file   Highest education level: Associate degree: occupational, Scientist, product/process development, or vocational program  Occupational History   Not on file  Tobacco Use   Smoking status: Former    Current packs/day: 0.00    Average packs/day: 0.8 packs/day for 20.0 years (15.0 ttl pk-yrs)    Types: Cigarettes    Start date: 29    Quit date: 1982    Years since quitting: 42.7    Passive exposure: Past   Smokeless tobacco: Never  Vaping Use   Vaping status: Never Used  Substance and Sexual Activity   Alcohol use: Not Currently   Drug use: Not Currently    Types: Marijuana   Sexual activity: Yes    Birth control/protection: None  Other Topics Concern   Not on file  Social History Narrative   Not on file   Social  Determinants of Health   Financial Resource Strain: Low Risk  (04/05/2023)   Overall Financial Resource Strain (CARDIA)    Difficulty of Paying Living Expenses: Not very hard  Food Insecurity: No Food Insecurity (04/05/2023)   Hunger Vital Sign    Worried About Running Out of Food in the Last Year: Never true    Ran Out of Food in the Last Year: Never true  Transportation Needs: No Transportation Needs (04/05/2023)   PRAPARE - Administrator, Civil Service (Medical): No    Lack of Transportation (Non-Medical): No  Physical Activity: Sufficiently Active (04/05/2023)   Exercise Vital Sign    Days of Exercise per Week: 2 days    Minutes of Exercise per Session: 150+ min   Stress: Stress Concern Present (04/05/2023)   [employer] of Occupational Health - Occupational Stress Questionnaire    Feeling of Stress : To some extent  Social Connections: Socially Integrated (04/05/2023)   Social Connection and Isolation Panel [NHANES]    Frequency of Communication with Friends and Family: More than three times a week    Frequency of Social Gatherings with Friends and Family: Once a week    Attends Religious Services: More than 4 times per year    Active Member of [employer] or Organizations: Yes    Attends Engineer, structural: More than 4 times per year    Marital Status: Married   Outpatient Medications Prior to Visit  Medication Sig   amLODipine (NORVASC) 5 MG tablet Take 5 mg by mouth daily.   atorvastatin (LIPITOR) 10 MG tablet Take 10 mg by mouth daily.   augmented betamethasone dipropionate (DIPROLENE-AF) 0.05 % ointment Apply 1 application topically daily as needed for rash.   bacitracin ointment Apply 1 application topically every 8 (eight) hours.   folic acid (FOLVITE) 1 MG tablet TAKE 1 TABLET BY MOUTH DAILY   levothyroxine (SYNTHROID) 112 MCG tablet Take 112 mcg by mouth daily.   methotrexate (RHEUMATREX) 2.5 MG tablet TAKE 5 TABLETS (12.5 MG TOTAL) BY MOUTH ONCE WEEKLY. CAUTION: CHEMOTHERAPY. PROTECT FROM LIGHT.   OVER THE COUNTER MEDICATION Apply 1 application topically daily as needed (pain). CBD cream   Polyethyl Glycol-Propyl Glycol (SYSTANE ULTRA OP) Place 1 drop into both eyes daily as needed (dry eyes).   prednisoLONE acetate (PRED FORTE) 1 % ophthalmic suspension Place 1 drop into the right eye 4 (four) times daily for 5 days. Discard remainder   tobramycin (TOBREX) 0.3 % ophthalmic solution Place 2 drops into the right eye every 4 (four) hours.   Turmeric 400 MG CAPS Take as needed per directions   No facility-administered medications prior to visit.   No Known Allergies  Immunization History  Administered Date(s) Administered    PFIZER(Purple Top)SARS-COV-2 Vaccination 08/25/2019, 09/20/2019   Tdap 04/03/2023    Health Maintenance  Topic Date Due   Medicare Annual Wellness (AWV)  Never done   Zoster Vaccines- Shingrix (1 of 2) Never done   Pneumonia Vaccine 80+ Years old (1 of 1 - PCV) Never done   COVID-19 Vaccine (3 - Pfizer risk series) 10/18/2019   INFLUENZA VACCINE  Never done   Colonoscopy  10/08/2028   DTaP/Tdap/Td (2 - Td or Tdap) 04/02/2033   Hepatitis C Screening  Completed   HPV VACCINES  Aged Out    Patient Care Team: Debera Lat, PA-C as PCP - General (Physician Assistant)  Review of Systems Except see HPI   {Insert previous labs (optional):23779} {See past labs  Heme  Chem  Endocrine  Serology  Results Review (optional):1}   Objective    BP 134/84 (BP Location: Right Arm, Patient Position: Sitting, Cuff Size: Large)   Pulse 66   Temp 98.1 F (36.7 C) (Oral)   Ht 5\' 8"  (1.727 m)   Wt 200 lb 14.4 oz (91.1 kg)   SpO2 98%   BMI 30.55 kg/m  {Insert last BP/Wt (optional):23777}{See vitals history (optional):1}   Physical Exam  Depression Screen     No data to display         No results found for any visits on 04/07/23.  Assessment & Plan     *** Assessment and Plan    History of Thyroid Cancer with Total Thyroidectomy Patient is on Levothyroxine replacement therapy. No current symptoms of hypothyroidism reported. -Continue Levothyroxine as prescribed.  Rheumatoid Arthritis Patient is on Methotrexate with mild pain but functional. No current flare reported. -Continue Methotrexate as prescribed.  Family History of Diabetes Patient has a strong family history of diabetes but no personal history of elevated blood sugars. -Order labs to check HbA1c and fasting glucose.  Vitamin B12 Deficiency Past medical history of B12 deficiency. Current status unknown. -Order labs to check B12 levels.  Renal Insufficiency Past medical history of renal insufficiency.  Current status unknown. -Order labs to check renal function.  Premature Ventricular Contractions Detected on physical examination. No symptoms reported by the patient. -Order EKG at next visit.  General Health Maintenance / Followup Plans -Order comprehensive labs including complete blood count, comprehensive metabolic panel, lipid panel, thyroid function tests, HbA1c, fasting glucose, B12 levels, and renal function tests. -Schedule follow-up appointment in one month to review lab results and adjust treatment plan as necessary.     Encounter to establish care Welcomed to our clinic Reviewed past medical hx, social hx, family hx and surgical hx Pt advised to send all vaccination records or screening   Return in about 4 weeks (around 05/05/2023) for march of 2025 for cpe , chronic disease f/u.    The patient was advised to call back or seek an in-person evaluation if the symptoms worsen or if the condition fails to improve as anticipated.  I discussed the assessment and treatment plan with the patient. The patient was provided an opportunity to ask questions and all were answered. The patient agreed with the plan and demonstrated an understanding of the instructions.  I, Debera Lat, PA-C have reviewed all documentation for this visit. The documentation on  04/07/23  for the exam, diagnosis, procedures, and orders are all accurate and complete.  Debera Lat, [facility], MMS [facility] [phone] (phone) [phone] (fax)  [facility]

## 2023-04-08 ENCOUNTER — Other Ambulatory Visit: Payer: Self-pay | Admitting: Physician Assistant

## 2023-04-08 DIAGNOSIS — E7849 Other hyperlipidemia: Secondary | ICD-10-CM

## 2023-04-08 LAB — COMPREHENSIVE METABOLIC PANEL
ALT: 11 [IU]/L (ref 0–44)
AST: 17 [IU]/L (ref 0–40)
Albumin: 4.3 g/dL (ref 3.9–4.9)
Alkaline Phosphatase: 70 [IU]/L (ref 44–121)
BUN/Creatinine Ratio: 10 (ref 10–24)
BUN: 13 mg/dL (ref 8–27)
Bilirubin Total: 0.6 mg/dL (ref 0.0–1.2)
CO2: 26 mmol/L (ref 20–29)
Calcium: 9.9 mg/dL (ref 8.6–10.2)
Chloride: 103 mmol/L (ref 96–106)
Creatinine, Ser: 1.29 mg/dL — ABNORMAL HIGH (ref 0.76–1.27)
Globulin, Total: 2.1 g/dL (ref 1.5–4.5)
Glucose: 79 mg/dL (ref 70–99)
Potassium: 4.5 mmol/L (ref 3.5–5.2)
Sodium: 142 mmol/L (ref 134–144)
Total Protein: 6.4 g/dL (ref 6.0–8.5)
eGFR: 60 mL/min/{1.73_m2} (ref >59)

## 2023-04-08 LAB — B12 AND FOLATE PANEL
Folate: 15.9 ng/mL (ref >3.0)
Vitamin B-12: 283 pg/mL (ref 232–1245)

## 2023-04-08 LAB — CBC WITH DIFFERENTIAL/PLATELET
Basophils Absolute: 0.1 10*3/uL (ref 0.0–0.2)
Basos: 1 %
EOS (ABSOLUTE): 0.1 10*3/uL (ref 0.0–0.4)
Eos: 1 %
Hematocrit: 48.5 % (ref 37.5–51.0)
Hemoglobin: 16.2 g/dL (ref 13.0–17.7)
Immature Grans (Abs): 0 10*3/uL (ref 0.0–0.1)
Immature Granulocytes: 0 %
Lymphocytes Absolute: 1.8 10*3/uL (ref 0.7–3.1)
Lymphs: 19 %
MCH: 32.5 pg (ref 26.6–33.0)
MCHC: 33.4 g/dL (ref 31.5–35.7)
MCV: 97 fL (ref 79–97)
Monocytes Absolute: 0.6 10*3/uL (ref 0.1–0.9)
Monocytes: 7 %
Neutrophils Absolute: 7 10*3/uL (ref 1.4–7.0)
Neutrophils: 72 %
Platelets: 267 10*3/uL (ref 150–450)
RBC: 4.98 x10E6/uL (ref 4.14–5.80)
RDW: 13.2 % (ref 11.6–15.4)
WBC: 9.6 10*3/uL (ref 3.4–10.8)

## 2023-04-08 LAB — LIPID PANEL
Chol/HDL Ratio: 3.9 {ratio} (ref 0.0–5.0)
Cholesterol, Total: 173 mg/dL (ref 100–199)
HDL: 44 mg/dL (ref >39)
LDL Chol Calc (NIH): 111 mg/dL — ABNORMAL HIGH (ref 0–99)
Triglycerides: 96 mg/dL (ref 0–149)
VLDL Cholesterol Cal: 18 mg/dL (ref 5–40)

## 2023-04-08 LAB — TSH: TSH: 2.44 u[IU]/mL (ref 0.450–4.500)

## 2023-04-30 ENCOUNTER — Encounter: Payer: Self-pay | Admitting: Physician Assistant

## 2023-04-30 ENCOUNTER — Ambulatory Visit (INDEPENDENT_AMBULATORY_CARE_PROVIDER_SITE_OTHER): Payer: Medicare Other | Admitting: Physician Assistant

## 2023-04-30 VITALS — BP 143/92 | Temp 98.3°F | Ht 68.0 in | Wt 201.0 lb

## 2023-04-30 DIAGNOSIS — J069 Acute upper respiratory infection, unspecified: Secondary | ICD-10-CM | POA: Diagnosis not present

## 2023-04-30 DIAGNOSIS — R0981 Nasal congestion: Secondary | ICD-10-CM

## 2023-04-30 LAB — POC COVID19 BINAXNOW: SARS Coronavirus 2 Ag: NEGATIVE

## 2023-05-01 NOTE — Progress Notes (Signed)
Established patient visit  Patient: Ralph Doyle   DOB: December 03, 1950   72 y.o. Male  MRN: [id number] Visit Date: 04/30/2023  Today's healthcare provider: Debera Lat, PA-C   Chief Complaint  Patient presents with   Nasal Congestion    Pt stated-coughing, congested, headache--over 1 week. Tried OTC cold med. Denied fever.   Subjective     Discussed the use of AI scribe software for clinical note transcription with the patient, who gave verbal consent to proceed.  History of Present Illness   The patient, with a history of occasional marijuana use, presents with persistent symptoms of a cold that started last week. He describes coughing, congestion, and a headache that has improved but not completely resolved. He also reports a sore throat that was initially severe but has since improved, although he still feels a little strange. He denies any known allergies, asthma, or COPD. He also denies any symptoms of gastroesophageal reflux, acid reflux, or indigestion. He denies any chest pain, shortness of breath, or arrhythmic heart beating. He has been vaccinated against COVID-19.         Medications: Outpatient Medications Prior to Visit  Medication Sig   amLODipine (NORVASC) 5 MG tablet Take 5 mg by mouth daily.   atorvastatin (LIPITOR) 10 MG tablet Take 10 mg by mouth daily.   augmented betamethasone dipropionate (DIPROLENE-AF) 0.05 % ointment Apply 1 application topically daily as needed for rash.   bacitracin ointment Apply 1 application topically every 8 (eight) hours.   folic acid (FOLVITE) 1 MG tablet TAKE 1 TABLET BY MOUTH DAILY   levothyroxine (SYNTHROID) 112 MCG tablet Take 112 mcg by mouth daily.   methotrexate (RHEUMATREX) 2.5 MG tablet TAKE 5 TABLETS (12.5 MG TOTAL) BY MOUTH ONCE WEEKLY. CAUTION: CHEMOTHERAPY. PROTECT FROM LIGHT.   OVER THE COUNTER MEDICATION Apply 1 application topically daily as needed (pain). CBD cream   Polyethyl Glycol-Propyl Glycol (SYSTANE ULTRA OP)  Place 1 drop into both eyes daily as needed (dry eyes).   Turmeric 400 MG CAPS Take as needed per directions   [DISCONTINUED] tobramycin (TOBREX) 0.3 % ophthalmic solution Place 2 drops into the right eye every 4 (four) hours.   No facility-administered medications prior to visit.    Review of Systems  All other systems reviewed and are negative.  Except see HPI       Objective    BP (!) 143/92 (BP Location: Left Arm, Patient Position: Sitting, Cuff Size: Large)   Temp 98.3 F (36.8 C)   Ht 5\' 8"  (1.727 m)   Wt 201 lb (91.2 kg)   SpO2 97%   BMI 30.56 kg/m     Physical Exam   Results for orders placed or performed in visit on 04/30/23  POC COVID-19  Result Value Ref Range   SARS Coronavirus 2 Ag Negative Negative    Assessment & Plan        Upper Respiratory Infection Symptoms of cough, congestion, and headache. No fever or shortness of breath. No signs of bacterial infection on examination. Likely viral etiology. -Performed COVID-19 test per pt's request was negative -Advise symptomatic treatment:hydration, warm salt gargle with baking soda, Vicks VapoRub, nasal saline rinse, Flonase, over-the-counter antihistamines (Allegra, Claritin), and Benadryl for nighttime symptoms. -If cough persists, consider Mucinex DM.  Hypertension Elevated blood pressure noted during visit. -Purchase a home blood pressure monitor and measure blood pressure daily for two weeks. -Follow-up in two weeks with blood pressure readings and bring the blood pressure monitor to the appointment  for accuracy check.      Return in about 2 weeks (around 05/14/2023) for BP f/u.     The patient was advised to call back or seek an in-person evaluation if the symptoms worsen or if the condition fails to improve as anticipated.  I discussed the assessment and treatment plan with the patient. The patient was provided an opportunity to ask questions and all were answered. The patient agreed with the plan  and demonstrated an understanding of the instructions.  I, Debera Lat, PA-C have reviewed all documentation for this visit. The documentation on 04/30/23  for the exam, diagnosis, procedures, and orders are all accurate and complete.  Debera Lat, [facility], MMS [facility] [phone] (phone) [phone] (fax)  [facility]

## 2023-05-03 NOTE — Progress Notes (Deleted)
Established patient visit  Patient: Ralph Doyle   DOB: [birth year]   72 y.o. Male  MRN: [id number] Visit Date: 05/08/2023  Today's healthcare provider: Debera Lat, PA-C   No chief complaint on file.  Subjective    HPI  *** Discussed the use of AI scribe software for clinical note transcription with the patient, who gave verbal consent to proceed.  History of Present Illness                No data to display             No data to display          Medications: Outpatient Medications Prior to Visit  Medication Sig  . amLODipine (NORVASC) 5 MG tablet Take 5 mg by mouth daily.  Marland Kitchen atorvastatin (LIPITOR) 10 MG tablet Take 10 mg by mouth daily.  Marland Kitchen augmented betamethasone dipropionate (DIPROLENE-AF) 0.05 % ointment Apply 1 application topically daily as needed for rash.  . bacitracin ointment Apply 1 application topically every 8 (eight) hours.  . folic acid (FOLVITE) 1 MG tablet TAKE 1 TABLET BY MOUTH DAILY  . levothyroxine (SYNTHROID) 112 MCG tablet Take 112 mcg by mouth daily.  . methotrexate (RHEUMATREX) 2.5 MG tablet TAKE 5 TABLETS (12.5 MG TOTAL) BY MOUTH ONCE WEEKLY. CAUTION: CHEMOTHERAPY. PROTECT FROM LIGHT.  Marland Kitchen OVER THE COUNTER MEDICATION Apply 1 application topically daily as needed (pain). CBD cream  . Polyethyl Glycol-Propyl Glycol (SYSTANE ULTRA OP) Place 1 drop into both eyes daily as needed (dry eyes).  . Turmeric 400 MG CAPS Take as needed per directions   No facility-administered medications prior to visit.    Review of Systems  All other systems reviewed and are negative. Except see HPI   {Insert previous labs (optional):23779} {See past labs  Heme  Chem  Endocrine  Serology  Results Review (optional):1}   Objective    There were no vitals taken for this visit. {Insert last BP/Wt (optional):23777}{See vitals history (optional):1}   Physical Exam Vitals reviewed.  Constitutional:      General: He is not in acute distress.     Appearance: Normal appearance. He is not diaphoretic.  HENT:     Head: Normocephalic and atraumatic.  Eyes:     General: No scleral icterus.    Conjunctiva/sclera: Conjunctivae normal.  Cardiovascular:     Rate and Rhythm: Normal rate and regular rhythm.     Pulses: Normal pulses.     Heart sounds: Normal heart sounds. No murmur heard. Pulmonary:     Effort: Pulmonary effort is normal. No respiratory distress.     Breath sounds: Normal breath sounds. No wheezing or rhonchi.  Musculoskeletal:     Cervical back: Neck supple.     Right lower leg: No edema.     Left lower leg: No edema.  Lymphadenopathy:     Cervical: No cervical adenopathy.  Skin:    General: Skin is warm and dry.     Findings: No rash.  Neurological:     Mental Status: He is alert and oriented to person, place, and time. Mental status is at baseline.  Psychiatric:        Mood and Affect: Mood normal.        Behavior: Behavior normal.     No results found for any visits on 05/08/23.  Assessment & Plan    *** Assessment and Plan              No follow-ups on file.  [facility]

## 2023-05-04 ENCOUNTER — Ambulatory Visit: Payer: Self-pay

## 2023-05-04 ENCOUNTER — Other Ambulatory Visit: Payer: Self-pay | Admitting: Internal Medicine

## 2023-05-04 ENCOUNTER — Ambulatory Visit (INDEPENDENT_AMBULATORY_CARE_PROVIDER_SITE_OTHER): Payer: Medicare Other | Admitting: Physician Assistant

## 2023-05-04 VITALS — BP 155/90 | HR 55 | Resp 14 | Ht 68.0 in | Wt 200.9 lb

## 2023-05-04 DIAGNOSIS — E7849 Other hyperlipidemia: Secondary | ICD-10-CM | POA: Diagnosis not present

## 2023-05-04 DIAGNOSIS — M0579 Rheumatoid arthritis with rheumatoid factor of multiple sites without organ or systems involvement: Secondary | ICD-10-CM

## 2023-05-04 DIAGNOSIS — J069 Acute upper respiratory infection, unspecified: Secondary | ICD-10-CM | POA: Diagnosis not present

## 2023-05-04 DIAGNOSIS — I10 Essential (primary) hypertension: Secondary | ICD-10-CM | POA: Diagnosis not present

## 2023-05-04 MED ORDER — VALSARTAN 40 MG PO TABS: 40.0000 mg | ORAL_TABLET | Freq: Every day | ORAL | 3 refills | Status: DC

## 2023-05-04 NOTE — Telephone Encounter (Signed)
Last Fill: 01/26/2023  Labs: 04/07/2023  Creatinine 1.29  Next Visit: 05/07/2023  Last Visit: 02/04/2023  DX: Rheumatoid arthritis with rheumatoid factor of multiple sites without organ or systems involvement   Current Dose per office note 02/04/2023: methotrexate 12.5 mg p.o. weekly   Okay to refill Methotrexate?

## 2023-05-04 NOTE — Progress Notes (Unsigned)
Established patient visit  Patient: Ralph Doyle   DOB: 05/19/1951   72 y.o. Male  MRN: [id number] Visit Date: 05/04/2023  Today's healthcare provider: Debera Lat, PA-C   Chief Complaint  Patient presents with   Hypertension   Subjective    Hypertension    *** Discussed the use of AI scribe software for clinical note transcription with the patient, who gave verbal consent to proceed.  History of Present Illness               05/04/2023    1:12 PM  Depression screen PHQ 2/9  Decreased Interest 0  Down, Depressed, Hopeless 1  PHQ - 2 Score 1  Altered sleeping 1  Tired, decreased energy 0  Change in appetite 0  Feeling bad or failure about yourself  0  Trouble concentrating 0  Moving slowly or fidgety/restless 0  Suicidal thoughts 0  PHQ-9 Score 2  Difficult doing work/chores Somewhat difficult       No data to display          Medications: Outpatient Medications Prior to Visit  Medication Sig   amLODipine (NORVASC) 5 MG tablet Take 5 mg by mouth daily.   atorvastatin (LIPITOR) 10 MG tablet Take 10 mg by mouth daily.   augmented betamethasone dipropionate (DIPROLENE-AF) 0.05 % ointment Apply 1 application topically daily as needed for rash.   bacitracin ointment Apply 1 application topically every 8 (eight) hours.   folic acid (FOLVITE) 1 MG tablet TAKE 1 TABLET BY MOUTH DAILY   levothyroxine (SYNTHROID) 112 MCG tablet Take 112 mcg by mouth daily.   methotrexate (RHEUMATREX) 2.5 MG tablet TAKE 5 TABLETS (12.5 MG TOTAL) BY MOUTH ONCE WEEKLY. CAUTION: CHEMOTHERAPY. PROTECT FROM LIGHT.   OVER THE COUNTER MEDICATION Apply 1 application topically daily as needed (pain). CBD cream   Polyethyl Glycol-Propyl Glycol (SYSTANE ULTRA OP) Place 1 drop into both eyes daily as needed (dry eyes).   Turmeric 400 MG CAPS Take as needed per directions   No facility-administered medications prior to visit.    Review of Systems  All other systems reviewed and are  negative.  Except see HPI   {Insert previous labs (optional):23779} {See past labs  Heme  Chem  Endocrine  Serology  Results Review (optional):1}   Objective    BP (!) 155/90 (BP Location: Right Arm, Patient Position: Sitting, Cuff Size: Normal)   Pulse (!) 55   Resp 14   Ht 5\' 8"  (1.727 m)   Wt 200 lb 14.4 oz (91.1 kg)   SpO2 100%   BMI 30.55 kg/m  {Insert last BP/Wt (optional):23777}{See vitals history (optional):1}   Physical Exam Vitals reviewed.  Constitutional:      General: He is not in acute distress.    Appearance: Normal appearance. He is not diaphoretic.  HENT:     Head: Normocephalic and atraumatic.  Eyes:     General: No scleral icterus.    Conjunctiva/sclera: Conjunctivae normal.  Cardiovascular:     Rate and Rhythm: Normal rate and regular rhythm.     Pulses: Normal pulses.     Heart sounds: Normal heart sounds. No murmur heard. Pulmonary:     Effort: Pulmonary effort is normal. No respiratory distress.     Breath sounds: Normal breath sounds. No wheezing or rhonchi.  Musculoskeletal:     Cervical back: Neck supple.     Right lower leg: No edema.     Left lower leg: No edema.  Lymphadenopathy:  Cervical: No cervical adenopathy.  Skin:    General: Skin is warm and dry.     Findings: No rash.  Neurological:     Mental Status: He is alert and oriented to person, place, and time. Mental status is at baseline.  Psychiatric:        Mood and Affect: Mood normal.        Behavior: Behavior normal.      No results found for any visits on 05/04/23.  Assessment & Plan     1. Upper respiratory infection, viral ***  2. Primary hypertension ***  3. Other hyperlipidemia ***   No follow-ups on file.    The patient was advised to call back or seek an in-person evaluation if the symptoms worsen or if the condition fails to improve as anticipated.  I discussed the assessment and treatment plan with the patient. The patient was provided an  opportunity to ask questions and all were answered. The patient agreed with the plan and demonstrated an understanding of the instructions.  I, Debera Lat, PA-C have reviewed all documentation for this visit. The documentation on  05/04/23 for the exam, diagnosis, procedures, and orders are all accurate and complete.  Debera Lat, [facility], MMS [facility] [phone] (phone) [phone] (fax)   [facility]

## 2023-05-04 NOTE — Telephone Encounter (Signed)
  Chief Complaint: HTN Symptoms: dull headache Frequency: today but BP elevated last four days Pertinent Negatives: Patient denies chest pain, weakness, numbness face arms or legs, trouble speaking or vision problems Disposition: [] ED /[] Urgent Care (no appt availability in office) / [x] Appointment(In office/virtual)/ []  [place] [facility]/ [] Home Care/ [] Refused Recommended Disposition /[] [place] [facility]/ []  Follow-up with PCP Additional Notes: advised pt to take BP med now. Reason for Disposition  [1] Systolic BP  >= 180 OR Diastolic >= 110 AND [2] missed most recent dose of blood pressure medication  Answer Assessment - Initial Assessment Questions 1. BLOOD PRESSURE: "What is the blood pressure?" "Did you take at least two measurements 5 minutes apart?"     Friday: 126/77  SAt: 147/95, 156/99 143/97    Sunday 170/105 155/97  Monday: 166/100  2. ONSET: "When did you take your blood pressure?"     See above 3. HOW: "How did you take your blood pressure?" (e.g., automatic home BP monitor, visiting nurse)     Automatic BP cuff 4. HISTORY: "Do you have a history of high blood pressure?"     yes 5. MEDICINES: "Are you taking any medicines for blood pressure?" "Have you missed any doses recently?"     Yes/hasn't taken pill today takes with breakfast  6. OTHER SYMPTOMS: "Do you have any symptoms?" (e.g., blurred vision, chest pain, difficulty breathing, headache, weakness)     Dull HA 7. PREGNANCY: "Is there any chance you are pregnant?" "When was your last menstrual period?"     N/a  Protocols used: Blood Pressure - High-A-AH

## 2023-05-05 ENCOUNTER — Encounter: Payer: Self-pay | Admitting: Physician Assistant

## 2023-05-05 DIAGNOSIS — E7849 Other hyperlipidemia: Secondary | ICD-10-CM | POA: Insufficient documentation

## 2023-05-05 DIAGNOSIS — I10 Essential (primary) hypertension: Secondary | ICD-10-CM | POA: Insufficient documentation

## 2023-05-06 ENCOUNTER — Encounter: Payer: Self-pay | Admitting: Physician Assistant

## 2023-05-06 ENCOUNTER — Ambulatory Visit: Payer: Self-pay | Admitting: *Deleted

## 2023-05-06 NOTE — Telephone Encounter (Signed)
  Chief Complaint: Follow up question Symptoms: does PCP want him to take his BP medication together  Disposition: [] ED /[] Urgent Care (no appt availability in office) / [] Appointment(In office/virtual)/ []  [place] [facility]/ [] Home Care/ [] Refused Recommended Disposition /[] [place] [facility]/ [x]  Follow-up with PCP Additional Notes: Patient needs to know if PCP wants him to take BP medications at same time

## 2023-05-06 NOTE — Telephone Encounter (Signed)
Summary: med management   Pt is asking if he should take valsartan (DIOVAN) 40 MG tablet and medication amLODipine (NORVASC) 5 MG tablet together and or if he should only be taking one of the two. Has already taken medication amLODipine (NORVASC) 5 MG tablet this morning.  Seeking clinical advice.     Reason for Disposition  [1] Caller has NON-URGENT medicine question about med that PCP prescribed AND [2] triager unable to answer question  Answer Assessment - Initial Assessment Questions 1. NAME of MEDICINE: "What medicine(s) are you calling about?"     Amlodipine and valsartan 2. QUESTION: "What is your question?" (e.g., double dose of medicine, side effect)     Does PCP want him to take them together or to separate them 3. PRESCRIBER: "Who prescribed the medicine?" Reason: if prescribed by specialist, call should be referred to that group.     PCP 4. SYMPTOMS: "Do you have any symptoms?" If Yes, ask: "What symptoms are you having?"  "How bad are the symptoms (e.g., mild, moderate, severe)     High BP Patient states she takes his amlodipine in am- -does PCP want him to take valsartan in am too?  Protocols used: Medication Question Call-A-AH

## 2023-05-07 ENCOUNTER — Ambulatory Visit: Payer: Medicare Other | Admitting: Physician Assistant

## 2023-05-07 ENCOUNTER — Encounter: Payer: Self-pay | Admitting: Internal Medicine

## 2023-05-07 ENCOUNTER — Ambulatory Visit: Payer: Medicare Other | Attending: Internal Medicine | Admitting: Internal Medicine

## 2023-05-07 ENCOUNTER — Ambulatory Visit: Payer: Self-pay | Admitting: *Deleted

## 2023-05-07 VITALS — BP 144/88 | HR 60 | Resp 16 | Ht 68.0 in | Wt 206.0 lb

## 2023-05-07 DIAGNOSIS — M0579 Rheumatoid arthritis with rheumatoid factor of multiple sites without organ or systems involvement: Secondary | ICD-10-CM

## 2023-05-07 DIAGNOSIS — M720 Palmar fascial fibromatosis [Dupuytren]: Secondary | ICD-10-CM

## 2023-05-07 DIAGNOSIS — Z79899 Other long term (current) drug therapy: Secondary | ICD-10-CM | POA: Diagnosis present

## 2023-05-07 DIAGNOSIS — M654 Radial styloid tenosynovitis [de Quervain]: Secondary | ICD-10-CM

## 2023-05-07 NOTE — Telephone Encounter (Signed)
Reason for Disposition  Caller has medicine question only, adult not sick, AND triager answers question  Answer Assessment - Initial Assessment Questions 1. NAME of MEDICINE: "What medicine(s) are you calling about?"  Valsartan  It's a new medicine for my BP. 2. QUESTION: "What is your question?" (e.g., double dose of medicine, side effect)     I'm supposed to take it.   It's a new medicine that I have not taken yet.    I was reading about it yesterday.   My BP was 165/102 yesterday.   At 10:50 AM yesterday it was 133/92.  Yesterday evening it was 135/89. This morning it's 151/93.  It's 151/98 now.  Should I take the Valsartan even though my BP seems to be coming down?     My concern is if my BP is coming down do I still need to take it?   I let him know he did need to take it?    His BP is still elevated more than it should be.    When should I take it?   Night or morning?    I have to take some pills for RA.   It's 5 pills.  I take my Amlodipine in the morning.  She said I could take it either in the morning or at night.    I let him know either time was fine but to be consistent with what ever time he chose.   He has chosen to take it at night.   He verbalized understanding and thanked me for talking with him and answering his questions.   I let him know to call back if he had further questions.    3. PRESCRIBER: "Who prescribed the medicine?" Reason: if prescribed by specialist, call should be referred to that group.     Debera Lat, PA-C 4. SYMPTOMS: "Do you have any symptoms?" If Yes, ask: "What symptoms are you having?"  "How bad are the symptoms (e.g., mild, moderate, severe)     No just BP is still higher than we would like it to be. 5. PREGNANCY:  "Is there any chance that you are pregnant?" "When was your last menstrual period?"     N/A  Protocols used: Medication Question Call-A-AH

## 2023-05-07 NOTE — Telephone Encounter (Signed)
  Chief Complaint: Had a question regarding new rx for Valsartan.   I answered his questions. Symptoms: BP still a little higher than want we want it to be.   I let him know he needed to take the Valsartan since he had not started it yet.   Frequency: Start today Pertinent Negatives: Patient denies N/A Disposition: [] ED /[] Urgent Care (no appt availability in office) / [] Appointment(In office/virtual)/ []  [place] [facility]/ [x] Home Care/ [] Refused Recommended Disposition /[] [place] [facility]/ []  Follow-up with PCP Additional Notes: Questions answered

## 2023-05-08 ENCOUNTER — Ambulatory Visit: Payer: Medicare Other | Admitting: Physician Assistant

## 2023-05-18 ENCOUNTER — Ambulatory Visit: Payer: Medicare Other | Admitting: Physician Assistant

## 2023-05-18 ENCOUNTER — Encounter: Payer: Self-pay | Admitting: Physician Assistant

## 2023-05-18 ENCOUNTER — Ambulatory Visit (INDEPENDENT_AMBULATORY_CARE_PROVIDER_SITE_OTHER): Payer: Medicare Other | Admitting: Physician Assistant

## 2023-05-18 VITALS — BP 139/89 | HR 60 | Ht 68.0 in | Wt 201.0 lb

## 2023-05-18 DIAGNOSIS — Z125 Encounter for screening for malignant neoplasm of prostate: Secondary | ICD-10-CM | POA: Diagnosis not present

## 2023-05-18 DIAGNOSIS — S99921A Unspecified injury of right foot, initial encounter: Secondary | ICD-10-CM

## 2023-05-18 DIAGNOSIS — E7849 Other hyperlipidemia: Secondary | ICD-10-CM

## 2023-05-18 DIAGNOSIS — M069 Rheumatoid arthritis, unspecified: Secondary | ICD-10-CM | POA: Diagnosis not present

## 2023-05-18 DIAGNOSIS — H04123 Dry eye syndrome of bilateral lacrimal glands: Secondary | ICD-10-CM

## 2023-05-18 DIAGNOSIS — R351 Nocturia: Secondary | ICD-10-CM

## 2023-05-18 DIAGNOSIS — I10 Essential (primary) hypertension: Secondary | ICD-10-CM

## 2023-05-18 DIAGNOSIS — R4589 Other symptoms and signs involving emotional state: Secondary | ICD-10-CM

## 2023-05-18 DIAGNOSIS — E890 Postprocedural hypothyroidism: Secondary | ICD-10-CM

## 2023-05-18 NOTE — Progress Notes (Unsigned)
Established patient visit  Patient: Ralph Doyle   DOB: 05/14/1951   72 y.o. Male  MRN: [id number] Visit Date: 05/18/2023  Today's healthcare provider: Debera Lat, PA-C   Chief Complaint  Patient presents with   Hypertension   Subjective     Discussed the use of AI scribe software for clinical note transcription with the patient, who gave verbal consent to proceed.  History of Present Illness   The patient, a retired Administrator, arts with a history of hypertension and thyroidectomy, presents with nocturia, getting up about five times a night to urinate. The patient reports that this frequency varies, sometimes being more and sometimes less. The patient also reports occasional coughing but denies chest pain or shortness of breath. The patient fell off a ladder recently and has a slightly swollen foot but is able to walk normally. The patient also reports occasional feelings of depression or anxiety but is not interested in medication for this. The patient has dry eye syndrome and uses Systane eye drops for this. The patient's blood pressure has been high but is slowly going down with medication. The patient has not had a flu shot in about 30 years and does not plan to get one.           05/18/2023    9:26 AM 05/04/2023    1:12 PM  Depression screen PHQ 2/9  Decreased Interest 0 0  Down, Depressed, Hopeless 1 1  PHQ - 2 Score 1 1  Altered sleeping 1 1  Tired, decreased energy 0 0  Change in appetite 0 0  Feeling bad or failure about yourself  0 0  Trouble concentrating 0 0  Moving slowly or fidgety/restless 0 0  Suicidal thoughts 0 0  PHQ-9 Score 2 2  Difficult doing work/chores Somewhat difficult Somewhat difficult      05/18/2023    9:26 AM  GAD 7 : Generalized Anxiety Score  Nervous, Anxious, on Edge 0  Control/stop worrying 0  Worry too much - different things 1  Trouble relaxing 1  Restless 0  Easily annoyed or irritable 0  Afraid - awful might happen  1  Total GAD 7 Score 3  Anxiety Difficulty Somewhat difficult    Medications: Outpatient Medications Prior to Visit  Medication Sig   amLODipine (NORVASC) 5 MG tablet Take 5 mg by mouth daily.   atorvastatin (LIPITOR) 10 MG tablet Take 10 mg by mouth daily.   augmented betamethasone dipropionate (DIPROLENE-AF) 0.05 % ointment Apply 1 application topically daily as needed for rash.   bacitracin ointment Apply 1 application topically every 8 (eight) hours.   folic acid (FOLVITE) 1 MG tablet TAKE 1 TABLET BY MOUTH DAILY   levothyroxine (SYNTHROID) 112 MCG tablet Take 112 mcg by mouth daily.   methotrexate (RHEUMATREX) 2.5 MG tablet TAKE 5 TABLETS (12.5 MG TOTAL) BY MOUTH ONCE WEEKLY. CAUTION: CHEMOTHERAPY. PROTECT FROM LIGHT.   OVER THE COUNTER MEDICATION Apply 1 application topically daily as needed (pain). CBD cream   Polyethyl Glycol-Propyl Glycol (SYSTANE ULTRA OP) Place 1 drop into both eyes daily as needed (dry eyes).   Turmeric 400 MG CAPS Take as needed per directions   valsartan (DIOVAN) 40 MG tablet Take 1 tablet (40 mg total) by mouth daily.   No facility-administered medications prior to visit.    Review of Systems  All other systems reviewed and are negative.  Except see HPI   {Insert previous labs (optional):23779} {See past labs  Heme  Chem  Endocrine  Serology  Results Review (optional):1}   Objective    BP 139/89 (BP Location: Right Arm, Patient Position: Sitting, Cuff Size: Normal)   Pulse 60   Ht 5\' 8"  (1.727 m)   Wt 201 lb (91.2 kg)   SpO2 100%   BMI 30.56 kg/m  {Insert last BP/Wt (optional):23777}{See vitals history (optional):1}   Physical Exam Vitals reviewed.  Constitutional:      General: He is not in acute distress.    Appearance: Normal appearance. He is obese. He is not diaphoretic.  HENT:     Head: Normocephalic and atraumatic.  Eyes:     General: No scleral icterus.    Conjunctiva/sclera: Conjunctivae normal.  Cardiovascular:      Rate and Rhythm: Normal rate and regular rhythm.     Pulses: Normal pulses.     Heart sounds: Normal heart sounds. No murmur heard. Pulmonary:     Effort: Pulmonary effort is normal. No respiratory distress.     Breath sounds: Normal breath sounds. No wheezing or rhonchi.  Musculoskeletal:     Cervical back: Neck supple.     Right lower leg: No edema.     Left lower leg: No edema.  Lymphadenopathy:     Cervical: No cervical adenopathy.  Skin:    General: Skin is warm and dry.     Findings: No rash.  Neurological:     Mental Status: He is alert and oriented to person, place, and time. Mental status is at baseline.  Psychiatric:        Mood and Affect: Mood normal.        Behavior: Behavior normal.      No results found for any visits on 05/18/23.  Assessment & Plan        Hypertension Chronic and improved Blood pressure readings are high, but showing improvement with Amlodipine 5mg  daily and Valsartan 40mg  daily. Discussed the importance of consistent medication use and lifestyle modifications including low salt and spice diet, and hydration. -Continue Amlodipine 5mg  daily and Valsartan 40mg  daily. -Continue monitoring blood pressure at home and maintain a log.  Hyperlipidemia Chronic and previously unstable The 10-year ASCVD risk score (Arnett DK, et al., 2019) is: 21.8% Patient is on Lipitor 10mg  nightly. Started after 04/08/23 results -Continue Lipitor 10mg  nightly. Continue lifestyle modifications: diet and exercise Will reassess LP at the next follow-up Will adjust Lipitor if ascvd risk will stay high  Foot Injury Patient fell off a ladder and has a swollen foot but is able to walk with mild discomfort. No immediate intervention required. -Observe and report if pain or swelling persists. Advised symptomatic treatment  Depressed mood Patient reports occasional feelings of depression and anxiety. Discussed the benefits of therapy. -Consider therapy sessions. Patient  to research and find a suitable therapist.  Hypothyroidism Chronic and previously stable Patient is on thyroid replacement therapy following thyroidectomy. -Continue current thyroid replacement therapy.  Nocturia Patient reports waking up 5 times a night to urinate. Discussed the need for Prostate-Specific Antigen (PSA) testing. -Order PSA blood work today.  Dry Eye Syndrome Patient is using over-the-counter Systane eye drops. Discussed the importance of regular use and UV protection. -Continue Systane eye drops at least three times a day. -Use sunglasses for UV protection.  In the setting of RA/methotrexate... General Health Maintenance -Schedule an annual wellness exam. -Consider pneumonia and shingles vaccines.      Return in about 3 months (around 08/18/2023) for chronic disease f/u, AW with RN.     The  patient was advised to call back or seek an in-person evaluation if the symptoms worsen or if the condition fails to improve as anticipated.  I discussed the assessment and treatment plan with the patient. The patient was provided an opportunity to ask questions and all were answered. The patient agreed with the plan and demonstrated an understanding of the instructions.  I, Debera Lat, PA-C have reviewed all documentation for this visit. The documentation on  05/18/23 for the exam, diagnosis, procedures, and orders are all accurate and complete.  Debera Lat, [facility], MMS [facility] [phone] (phone) [phone] (fax)  [facility]

## 2023-05-19 LAB — PSA TOTAL (REFLEX TO FREE): Prostate Specific Ag, Serum: 1.4 ng/mL (ref 0.0–4.0)

## 2023-05-26 ENCOUNTER — Other Ambulatory Visit: Payer: Self-pay | Admitting: Internal Medicine

## 2023-05-26 DIAGNOSIS — M0579 Rheumatoid arthritis with rheumatoid factor of multiple sites without organ or systems involvement: Secondary | ICD-10-CM

## 2023-05-26 NOTE — Telephone Encounter (Signed)
Last Fill: 05/15/2022  Next Visit: 08/10/2023  Last Visit: 05/07/2023  Dx:  Rheumatoid arthritis with rheumatoid factor of multiple sites without organ or systems involvement   Current Dose per office note on 05/07/2023: folic acid 1 mg daily   Okay to refill Folic Acid?

## 2023-07-27 NOTE — Progress Notes (Signed)
 Office Visit Note  Patient: Ralph Doyle             Date of Birth: 1951-06-30           MRN: [id number]             PCP: Debera Lat, PA-C Referring: Debera Lat, PA-C Visit Date: 08/10/2023   Subjective:  Follow-up  Discussed the use of AI scribe software for clinical note transcription with the patient, who gave verbal consent to proceed.  History of Present Illness   Ralph Doyle is a 73 y.o. 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 weekly and folic acid 1 mg daily.    He has been experiencing knee pain over the last few days, which he attributes to increased walking. The pain is located in the front of the knee and occurs particularly when taking certain steps, but it is not constant. He engages in [place], which he finds beneficial for balance and arthritis.  His rheumatoid arthritis has been well-controlled with methotrexate, with no significant flare-ups in his shoulders, hands, or wrists.  No recent illness or significant upper respiratory symptoms, although he has been around his great-granddaughter who has been unwell.     Previous HPI 05/07/2023 Ralph Doyle is a 73 y.o. male here for follow up  for seropositive RA on methotrexate 12.5 mg p.o. weekly and folic acid 1 mg daily.  Since last visit he has been doing well with new new flareup.  No visible joint swelling.  Does not have any prolonged morning stiffness.  Not requiring as needed NSAIDs.  He is still taking turmeric daily.  Previous discoloration from the right wrist injection has resolved.  He was sick with upper respiratory illness resolved without any specific antibiotic or antiviral treatment.   Previous HPI 02/04/2023 Ralph Doyle is a 73 y.o. 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 weekly and folic acid 1 mg daily.  After our last visit right wrist pain did improve with steroid injection though he did not wear the wrist brace as  discussed at the time.  Currently symptoms are doing well at about baseline no major flareup of increased swelling or limitations.  Does get increased hand pain with prolonged guitar playing.  He developed a area of lighter pigmentation at his wrist where we did the injection in May.   Previous HPI 11/04/2022 Ralph Doyle is a 73 y.o. 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 weekly and folic acid 1 mg daily.  Currently has increased pain in the right wrist is been ongoing for about 4 to 5 weeks.  He thinks it may be associated with looking after his grandchildren since he notices some increased pain while lifting them.  There is a small amount of associated swelling.  Otherwise has some shoulder pain more on left but not in particular exacerbation.   Previous HPI 08/06/22 Ralph Doyle is a 73 y.o. 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 weekly and folic acid 1 mg daily.  Overall doing well without significant flareups.  He takes the turmeric consistently which is beneficial and as needed takes Tylenol not every day and usually up to once or twice if he is having some increased hand pain or anticipates more strenuous activity than usual.   Previous HPI 05/07/22 Ralph Doyle is a 73 y.o. male here for follow up  for seropositive RA on MTX 12.5 mg PO weekly and folic acid 1 mg daily.  Overall symptoms are doing pretty well.  Working with occupational therapy has been beneficial for his hand pain and range of motion.  He is ended up not really trying the patch device he mentioned at the last visit I do not see significant clinical data to really recommend 1 way or the other.   Previous HPI 02/04/2022 Ralph Doyle is a 73 y.o. 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 He has not required any additional steroid treatments since our last visit. He had some ultrasound shockwave treatment for tendonitis  symptoms with a good benefit. Physical therapy has improved shoulder pain and range of motion. He is trying a new Aeon patch device.    Previous HPI 10/30/2021 Ralph Doyle is a 73 y.o. male here for follow up for seropositive RA on MTX 12.5 mg PO weekly folic acid 1 mg daily tried tapering prednisone and trying turmeric supplementation.  Overall symptoms are doing fairly well. He has some pain in is left middle finger hands otherwise about the same as before. He is having pain at the base of the neck more than usual, going for massages these temporarily help. Pain is somewhat worse with looking side to side.   Review of Systems  Constitutional:  Negative for fatigue.  HENT:  Negative for mouth sores and mouth dryness.   Eyes:  Positive for dryness.  Respiratory:  Negative for shortness of breath.   Cardiovascular:  Negative for chest pain and palpitations.  Gastrointestinal:  Positive for constipation. Negative for blood in stool and diarrhea.  Endocrine: Positive for increased urination.  Genitourinary:  Negative for involuntary urination.  Musculoskeletal:  Positive for joint pain, joint pain, myalgias, morning stiffness and myalgias. Negative for gait problem, joint swelling, muscle weakness and muscle tenderness.  Skin:  Positive for color change and rash. Negative for hair loss and sensitivity to sunlight.  Allergic/Immunologic: Negative for susceptible to infections.  Neurological:  Negative for dizziness and headaches.  Hematological:  Negative for swollen glands.  Psychiatric/Behavioral:  Positive for depressed mood and sleep disturbance. The patient is not nervous/anxious.     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   Pain in right hand 10/12/2020   Pain of left hand 10/12/2020   Flexor tenosynovitis of finger 08/15/2020   Rheumatoid arthritis with rheumatoid factor of multiple sites without organ or systems involvement (HCC) 06/14/2020   High risk medication use 06/14/2020   Thyroid malignant neoplasm (HCC) 05/16/2020    Past Medical History:  Diagnosis Date   Cancer (HCC)    Thyroid   Hyperlipidemia    Hypertension    Rheumatoid arthritis (HCC)     Family History  Problem Relation Age of Onset   Diabetes Mother    Hypertension Mother    Arthritis Mother    Diabetes Father    Hypertension Father    Diabetes Brother    Hypertension Brother  Hypertension Brother    Past Surgical History:  Procedure Laterality Date   COLONOSCOPY WITH PROPOFOL     COLONOSCOPY WITH PROPOFOL N/A 10/08/2021   Procedure: COLONOSCOPY WITH PROPOFOL;  Surgeon: Midge Minium, MD;  Location[facility] ENDOSCOPY;  Service: Endoscopy;  Laterality: N/A;   MANDIBLE SURGERY     THYROIDECTOMY Bilateral 05/16/2020   Procedure: TOTAL THYROIDECTOMY;  Surgeon: Geanie Logan, MD;  Location: [facility] ORS;  Service: ENT;  Laterality: Bilateral;   Social History   Social History Narrative   Not on file   Immunization History  Administered Date(s) Administered   PFIZER(Purple Top)SARS-COV-2 Vaccination 08/25/2019, 09/20/2019   Tdap 04/03/2023     Objective: Vital Signs: BP 138/79 (BP Location: Right Arm, Patient Position: Sitting, Cuff Size: Large)   Pulse 60   Resp 12   Ht 5\' 8"  (1.727 m)   Wt 204 lb (92.5 kg)   BMI 31.02 kg/m    Physical Exam Cardiovascular:     Rate and Rhythm: Normal rate and regular rhythm.  Pulmonary:     Effort: Pulmonary effort is normal.     Breath sounds: Normal breath  sounds.  Musculoskeletal:     Right lower leg: No edema.     Left lower leg: No edema.  Skin:    General: Skin is warm and dry.     Findings: No rash.  Neurological:     Mental Status: He is alert.  Psychiatric:        Mood and Affect: Mood normal.      Musculoskeletal Exam:  Shoulders full ROM no tenderness or swelling Elbows full ROM no tenderness or swelling Wrists full ROM no tenderness or swelling Fingers slightly restricted extension ROM, palmar contracture thickening proximal to 4th/5th fingers, no focal tenderness, no palpable synovitis Knees full ROM no tenderness or swelling, bilateral patellofemoral crepitus   Investigation: No additional findings.  Imaging: XR KNEE 3 VIEW LEFT Result Date: 08/13/2023 X-ray left knee 3 views Medial and lateral compartment joint space are preserved possibly small marginal and posterior osteophyte.  Mild patellofemoral joint space narrowing with subchondral cystic changes and lateral osteophyte.  No visible effusion or abnormal calcification. 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  Probable patellofemoral joint space narrowing and mild lateral deviation of patella.  Small superior patellar enthesophyte.  No visible effusion or abnormal calcification. Impression Mild tricompartmental osteoarthritis, superior patellar enthesophyte may be consistent with patellofemoral arthritis or chronic quadriceps tendinopathy   Recent Labs: Lab Results  Component Value Date   WBC 8.0 08/10/2023   HGB 15.6 08/10/2023   PLT 276 08/10/2023   NA 141 08/10/2023   K 4.6 08/10/2023   CL 104 08/10/2023   CO2 30 08/10/2023   GLUCOSE 76 08/10/2023   BUN 13  08/10/2023   CREATININE 1.30 (H) 08/10/2023   BILITOT 0.8 08/10/2023   ALKPHOS 70 04/07/2023   AST 13 08/10/2023   ALT 11 08/10/2023   PROT 6.7 08/10/2023   ALBUMIN 4.3 04/07/2023   CALCIUM 9.8 08/10/2023   GFRAA 69 11/06/2020    Speciality Comments: No specialty comments available.  Procedures:  No procedures performed Allergies: Patient has no known allergies.   Assessment / Plan:     Visit Diagnoses: Rheumatoid arthritis with rheumatoid factor of  multiple sites without organ or systems involvement (HCC) - Plan: C-reactive protein, Rheumatoid factor Stable on Methotrexate with no recent flare-ups. Blood tests including inflammatory markers have been normal for the past year. Discussed the possibility of discontinuing Methotrexate if the inflammatory arthritis is not currently active. -Order blood tests including inflammatory markers and rheumatoid factor. -Consider tapering Methotrexate down to 4 tablets next step, if tests are normal and no flare-ups occur.   High risk medication use - methotrexate 12.5 mg p.o. weekly and folic acid 1 mg daily. - Plan: CBC with Differential/Platelet, COMPLETE METABOLIC PANEL WITH GFR -Checking CBC and CMP for medication monitoring on continued use of methotrexate.  Dupuytren's contracture of both hands  Chronic pain of both knees - Plan: XR KNEE 3 VIEW RIGHT, XR KNEE 3 VIEW LEFT, Ambulatory referral to Physical Therapy New onset, intermittent, and related to activity. Likely due to osteoarthritis given crepitus on exam. Xrays of both knees showing degenerative arthritis but also patellar enthesophytes. -Discussed the benefits of Tai Chi, walking, and other exercises to improve joint health. -Refer for physical therapy for targeted exercises and management.     Orders: Orders Placed This Encounter  Procedures   XR KNEE 3 VIEW RIGHT   XR KNEE 3 VIEW LEFT   C-reactive protein   CBC with Differential/Platelet   COMPLETE METABOLIC PANEL WITH  GFR   Rheumatoid factor   Ambulatory referral to Physical Therapy   No orders of the defined types were placed in this encounter.    Follow-Up Instructions: Return in about 3 months (around 11/07/2023) for RA on MTX f/u 3mos.   Fuller Plan, MD  Note - This record has been created using [employer].  Chart creation errors have been sought, but may not always  have been located. Such creation errors do not reflect on  the standard of medical care.

## 2023-07-31 ENCOUNTER — Other Ambulatory Visit: Payer: Self-pay | Admitting: Internal Medicine

## 2023-07-31 DIAGNOSIS — M0579 Rheumatoid arthritis with rheumatoid factor of multiple sites without organ or systems involvement: Secondary | ICD-10-CM

## 2023-07-31 NOTE — Telephone Encounter (Signed)
Last Fill: 05/04/2023  Labs: 04/07/2023 creatinine 1.29  Next Visit: 08/10/2023  Last Visit: 05/07/2023  DX: Rheumatoid arthritis with rheumatoid factor of multiple sites without organ or systems involvement   Current Dose per office note on 05/07/2023: Plan to continue on methotrexate 12.5 mg p.o. weekly   Okay to refill Methotrexate?

## 2023-08-10 ENCOUNTER — Ambulatory Visit: Payer: Medicare Other | Attending: Internal Medicine | Admitting: Internal Medicine

## 2023-08-10 ENCOUNTER — Encounter: Payer: Self-pay | Admitting: Internal Medicine

## 2023-08-10 ENCOUNTER — Ambulatory Visit: Payer: Medicare Other

## 2023-08-10 VITALS — BP 138/79 | HR 60 | Resp 12 | Ht 68.0 in | Wt 204.0 lb

## 2023-08-10 DIAGNOSIS — M25562 Pain in left knee: Secondary | ICD-10-CM | POA: Diagnosis not present

## 2023-08-10 DIAGNOSIS — G8929 Other chronic pain: Secondary | ICD-10-CM | POA: Insufficient documentation

## 2023-08-10 DIAGNOSIS — M0579 Rheumatoid arthritis with rheumatoid factor of multiple sites without organ or systems involvement: Secondary | ICD-10-CM | POA: Diagnosis present

## 2023-08-10 DIAGNOSIS — Z79899 Other long term (current) drug therapy: Secondary | ICD-10-CM | POA: Insufficient documentation

## 2023-08-10 DIAGNOSIS — M720 Palmar fascial fibromatosis [Dupuytren]: Secondary | ICD-10-CM | POA: Insufficient documentation

## 2023-08-10 DIAGNOSIS — M25561 Pain in right knee: Secondary | ICD-10-CM | POA: Diagnosis present

## 2023-08-10 NOTE — Patient Instructions (Signed)
 Quadriceps stretch, prone  Lie on your abdomen on a firm surface, such as a bed or padded floor (prone position). Bend your left / right knee and hold your ankle. If you cannot reach your ankle or pant leg, loop a belt around your foot and grab the belt instead. Gently pull your heel toward your buttocks. Your knee should not slide out to the side. You should feel a stretch in the front of your thigh and knee (quadriceps). Hold this position for __________ seconds. Repeat __________ times. Complete this exercise __________ times a day.   Strengthening exercises These exercises build strength and endurance in your thigh. Endurance is the ability to use your muscles for a long time, even after your muscles get tired. Straight leg raises, supine  This exercise stretches the muscles in front of your thigh (quadriceps) and the muscles that move your hips (hip flexors). Quality counts! Watch for signs that the quadriceps muscle is working to ensure that you are strengthening the correct muscles and not cheating by using healthier muscles. Lie on your back (supine position) with your left / right leg extended and your other knee bent. Tense the muscles in the front of your left / right thigh. You should see your kneecap slide up or see increased dimpling just above the knee. Tighten these muscles even more and raise your leg 4-6 inches (10-15 cm) off the floor. Hold this position for __________ seconds. Keep the thigh muscles tense as you lower your leg. Relax the muscles slowly and completely after each repetition. Repeat __________ times. Complete this exercise __________ times a day.   Squats This is a weight-bearing exercise in which you bend your knees and lower your hips while engaging your thigh muscles. Stand in front of a table, with your feet and knees pointing straight ahead. You may rest your hands on the table for balance but not for support. Slowly bend your knees and lower your  hips like you are going to sit in a chair. Keep your weight over your heels, not over your toes. Keep your lower legs upright so they are parallel with the table legs. Do not let your hips go lower than your knees. Do not bend lower than told by your health care provider. If your knee pain increases, do not bend as low. Hold the squat position for __________ seconds. Slowly push with your legs to return to standing. Do not use your hands to pull yourself to standing. Repeat __________ times. Complete this exercise __________ times a day. Step-downs This is an exercise in which you step down slowly while engaging your leg muscles. Stand on the edge of a step. Keeping your weight over your left / right heel, slowly bend your left / right knee to bring your left / right heel toward the floor. Lower your heel as far as you can while keeping control and without increasing any discomfort. Do not let your left / right knee come forward. Use your leg muscles, not gravity, to lower your body. Hold on to a wall or rail for balance if needed. Slowly push through your heel to lift your body weight back up. Return to the starting position. Repeat __________ times. Complete this exercise __________ times a day. Straight leg raises, abductors This exercise strengthens the muscles that rotate the leg at the hip and move it away from your body (hip abductors). Lie on your side with your left / right leg in the top position. Lie so your  head, shoulder, knee, and hip line up. You may bend your bottom knee to help you keep your balance. Roll your hips slightly forward, so that your hips are stacked directly over each other and your left / right knee is facing forward. Leading with your heel, lift your top leg 4-6 inches (10-15 cm). You should feel the muscles in your outer hip lifting. Do not let your foot drift forward. Do not let your knee roll toward the ceiling. Hold this position for __________  seconds. Slowly lower your leg to the starting position. Let your muscles relax completely after each repetition. Repeat __________ times. Complete this exercise __________ times a day.  Wall sits Follow the directions for form closely. Knee pain can occur if your feet or knees are not placed properly. Lean your back against a smooth wall or door, and walk your feet out 18-24 inches (46-61 cm) from it. Place your feet hip-width apart. Slowly slide down the wall or door until your knees bend __________ degrees. Keep your weight back and over your heels, not over your toes. Keep your thighs straight or pointing slightly outward. Hold this position for __________ seconds. Use your thigh and buttocks muscles to push yourself back up to a standing position. Keep your weight through your heels while you do this. Rest for __________ seconds after each repetition. Repeat __________ times. Complete this exercise __________ times a day.

## 2023-08-11 LAB — C-REACTIVE PROTEIN: CRP: <3 mg/L (ref <8.0)

## 2023-08-11 LAB — CBC WITH DIFFERENTIAL/PLATELET
Absolute Lymphocytes: 1752 {cells}/uL (ref 850–3900)
Absolute Monocytes: 536 {cells}/uL (ref 200–950)
Basophils Absolute: 48 {cells}/uL (ref 0–200)
Basophils Relative: 0.6 %
Eosinophils Absolute: 72 {cells}/uL (ref 15–500)
Eosinophils Relative: 0.9 %
HCT: 47.7 % (ref 38.5–50.0)
Hemoglobin: 15.6 g/dL (ref 13.2–17.1)
MCH: 32 pg (ref 27.0–33.0)
MCHC: 32.7 g/dL (ref 32.0–36.0)
MCV: 97.9 fL (ref 80.0–100.0)
MPV: 10.1 fL (ref 7.5–12.5)
Monocytes Relative: 6.7 %
Neutro Abs: 5592 {cells}/uL (ref 1500–7800)
Neutrophils Relative %: 69.9 %
Platelets: 276 10*3/uL (ref 140–400)
RBC: 4.87 10*6/uL (ref 4.20–5.80)
RDW: 13.8 % (ref 11.0–15.0)
Total Lymphocyte: 21.9 %
WBC: 8 10*3/uL (ref 3.8–10.8)

## 2023-08-11 LAB — COMPLETE METABOLIC PANEL WITH GFR
AG Ratio: 1.9 (calc) (ref 1.0–2.5)
ALT: 11 U/L (ref 9–46)
AST: 13 U/L (ref 10–35)
Albumin: 4.4 g/dL (ref 3.6–5.1)
Alkaline phosphatase (APISO): 69 U/L (ref 35–144)
BUN/Creatinine Ratio: 10 (calc) (ref 6–22)
BUN: 13 mg/dL (ref 7–25)
CO2: 30 mmol/L (ref 20–32)
Calcium: 9.8 mg/dL (ref 8.6–10.3)
Chloride: 104 mmol/L (ref 98–110)
Creat: 1.3 mg/dL — ABNORMAL HIGH (ref 0.70–1.28)
Globulin: 2.3 g/dL (ref 1.9–3.7)
Glucose, Bld: 76 mg/dL (ref 65–99)
Potassium: 4.6 mmol/L (ref 3.5–5.3)
Sodium: 141 mmol/L (ref 135–146)
Total Bilirubin: 0.8 mg/dL (ref 0.2–1.2)
Total Protein: 6.7 g/dL (ref 6.1–8.1)
eGFR: 59 mL/min/{1.73_m2} — ABNORMAL LOW (ref >=60)

## 2023-08-11 LAB — RHEUMATOID FACTOR: Rheumatoid fact SerPl-aCnc: 17 [IU]/mL — ABNORMAL HIGH (ref <14)

## 2023-08-13 ENCOUNTER — Encounter: Payer: Self-pay | Admitting: Physical Therapy

## 2023-08-13 ENCOUNTER — Ambulatory Visit: Payer: Medicare Other | Attending: Internal Medicine | Admitting: Physical Therapy

## 2023-08-13 DIAGNOSIS — M25561 Pain in right knee: Secondary | ICD-10-CM | POA: Diagnosis present

## 2023-08-13 DIAGNOSIS — G8929 Other chronic pain: Secondary | ICD-10-CM | POA: Diagnosis present

## 2023-08-13 DIAGNOSIS — M25562 Pain in left knee: Secondary | ICD-10-CM | POA: Insufficient documentation

## 2023-08-13 NOTE — Therapy (Signed)
OUTPATIENT PHYSICAL THERAPY LOWER EXTREMITY EVALUATION   Patient Name: Ralph Doyle MRN: [id number] DOB:1950/10/31, 73 y.o., male Today's Date: 08/13/2023  END OF SESSION:  PT End of Session - 08/13/23 1643     Visit Number 1    Number of Visits 20    Date for PT Re-Evaluation 10/22/23    Authorization Type Medicare [id number]    Authorization - Visit Number 1    Authorization - Number of Visits 20    Progress Note Due on Visit 10    PT Start Time 1645    PT Stop Time 1731    PT Time Calculation (min) 46 min    Activity Tolerance Patient tolerated treatment well    Behavior During Therapy WFL for tasks assessed/performed             Past Medical History:  Diagnosis Date   Cancer (HCC)    Thyroid   Hyperlipidemia    Hypertension    Rheumatoid arthritis (HCC)    Past Surgical History:  Procedure Laterality Date   COLONOSCOPY WITH PROPOFOL     COLONOSCOPY WITH PROPOFOL N/A 10/08/2021   Procedure: COLONOSCOPY WITH PROPOFOL;  Surgeon: Midge Minium, MD;  Location: [facility];  Service: Endoscopy;  Laterality: N/A;   MANDIBLE SURGERY     THYROIDECTOMY Bilateral 05/16/2020   Procedure: TOTAL THYROIDECTOMY;  Surgeon: Geanie Logan, MD;  Location: [facility] ORS;  Service: ENT;  Laterality: Bilateral;   Patient Active Problem List   Diagnosis Date Noted   Primary hypertension 05/05/2023   Other hyperlipidemia 05/05/2023   Radial styloid tenosynovitis (de quervain) 11/04/2022   Dupuytren's contracture of both hands 05/07/2022   Neck pain 10/30/2021   Positive colorectal cancer screening using Cologuard test    Polyp of transverse colon    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   Pain in right hand 10/12/2020   Pain of left hand 10/12/2020   Flexor tenosynovitis of finger 08/15/2020   Rheumatoid arthritis with rheumatoid factor of multiple sites without organ or systems involvement (HCC) 06/14/2020   High risk medication use 06/14/2020   Thyroid malignant neoplasm (HCC)  05/16/2020    PCP: Debera Lat, PA-C  REFERRING PROVIDER: Fuller Plan, MD  REFERRING DIAG: M25.561,M25.562,G89.29 (ICD-10-CM) - Chronic pain of both knees  THERAPY DIAG:  Bilateral chronic knee pain  Rationale for Evaluation and Treatment: Rehabilitation  ONSET DATE: 4 years   SUBJECTIVE:   SUBJECTIVE STATEMENT: See Pertinent History   PERTINENT HISTORY: Pt has been having bilateral knee pain (R>L) while walking over the past 3-4 years. He says the pain comes on when he takes a step incorrectly or when he ascends or descends stairs. Pt reports no numbness or tingling and the pain doesn't travel above or below the knee but he has had some lateral hip pain. Pt got x-rays and the doctor said surgery wasn't warranted and PT could help. Pt said it isn't constantly hurting, it comes and goes depending on how he is walking. He has not tried any pain medication and the pain goes away on its own. He reports that he is not limited functionally. He likes to play golf and go on walks and has only noticed some pain with these activities, but not enough to stop.   PAIN:  Are you having pain? Yes: NPRS scale: 7/10  Pain location: anterior, surrounding kneecap, travels a little laterally  Pain description: sharp pain comes on then goes away (R>L), achey  Aggravating factors: walking, stairs  Relieving factors: rest ,  icy hot  PRECAUTIONS: None  RED FLAGS: None   WEIGHT BEARING RESTRICTIONS: No  FALLS:  Has patient fallen in last 6 months? Yes. Number of falls 1, off a ladder but did not injure himself   LIVING ENVIRONMENT: Lives with: lives with their family and lives with their spouse Lives in: House/apartment Stairs: Yes: Internal: 12 steps; on right going up Has following equipment at home: None  OCCUPATION: Retired  PLOF: Independent  PATIENT GOALS: to be stronger and have less pain  NEXT MD VISIT: 3 months  OBJECTIVE:  Note: Objective measures were completed at  Evaluation unless otherwise noted.  Vitals: BP: 141/86 HR: 60 SpO2: 99  DIAGNOSTIC FINDINGS:  X-ray left knee 3 views   Medial and lateral compartment joint space are preserved possibly small  marginal and posterior osteophyte.  Mild patellofemoral joint space  narrowing with subchondral cystic changes and lateral osteophyte.  No  visible effusion or abnormal calcification.   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  Probable patellofemoral joint space narrowing and mild  lateral deviation of patella.  Small superior patellar enthesophyte.  No  visible effusion or abnormal calcification.   Impression   Mild tricompartmental osteoarthritis, superior patellar enthesophyte may  be consistent with patellofemoral arthritis or chronic quadriceps  Tendinopathy  PATIENT SURVEYS:  KOOS:  49/100  COGNITION: Overall cognitive status: Within functional limits for tasks assessed     SENSATION: WFL  MUSCLE LENGTH: Hamstrings: Right NT deg; Left NT deg  POSTURE: rounded shoulders, forward head, and flexed trunk   PALPATION: No tenderness  LOWER EXTREMITY ROM:  Active ROM Right eval Left eval  Hip flexion    Hip extension    Hip abduction    Hip adduction    Hip internal rotation    Hip external rotation    Knee flexion 130/130 130/130  Knee extension 0/0 0/0  Ankle dorsiflexion    Ankle plantarflexion    Ankle inversion    Ankle eversion     (Blank rows = not tested)  LOWER EXTREMITY MMT:  MMT Right eval Left eval  Hip flexion 4- 4-  Hip extension 4+ 4+  Hip abduction 4+ 4+  Hip adduction 4+ 4+  Hip internal rotation 5 5  Hip external rotation 5 5  Knee flexion 5 5  Knee extension 4* 4  Ankle  dorsiflexion    Ankle plantarflexion    Ankle inversion    Ankle eversion     (Blank rows = not tested)  LOWER EXTREMITY SPECIAL TESTS:  Hip special tests: Ely's test: positive  Knee special tests: Step up/down test: negative and Valgus/varus test: negative   FUNCTIONAL TESTS:  6 minute walk test: 1950 ft; pt reported no increase in knee pain  GAIT: Distance walked: 1950 ft Assistive device utilized: None Level of assistance: Complete Independence Comments: normal  TREATMENT DATE:  08/13/23  Prone Quad stretch 1x60sec each side  PATIENT EDUCATION:  Education details: Form and technique for correct performance of exercise and explanation about exam findings  Person educated: Patient Education method: Explanation, Verbal cues, and Handouts Education comprehension: verbalized understanding and returned demonstration  HOME EXERCISE PROGRAM: Access Code: [email or web address] URL: [email or web address] Date: 08/13/2023 Prepared by: Consuelo Pandy  Exercises - Prone Quadriceps Stretch with Strap  - 1 x daily - 7 x weekly - 3 sets - 10 reps - 60 sec hold  ASSESSMENT:  CLINICAL IMPRESSION: Patient is a 73 y.o. male who was seen today for physical therapy evaluation and treatment for chronic bilateral knee pain. Pt presents with bilateral quad tightness and hip flexion and knee extension weakness. Pt has osteoarthritis in both knees as shown by his radiographs which may be aggravated by his quad weakness because the knee is not stabilized during movement. Further testing will be done to understand when the pain is brought on. Despite reporting having pain while walking, pt showed good aerobic endurance with no increase of pain during his 6 minute walk test. Pt will benefit from further strengthening exercises to decrease the pain in his knees while walking or squatting.    OBJECTIVE IMPAIRMENTS: decreased activity tolerance, difficulty walking, decreased strength, impaired flexibility, and pain.   ACTIVITY LIMITATIONS: bending, sitting, standing, squatting, stairs, and bed mobility, walking  PARTICIPATION LIMITATIONS:  N/A  PERSONAL FACTORS: Age, Past/current experiences, Time since onset of injury/illness/exacerbation, and 1-2 comorbidities: HTN and RA  are also affecting patient's functional outcome.   REHAB POTENTIAL: Excellent  CLINICAL DECISION MAKING: Stable/uncomplicated  EVALUATION COMPLEXITY: Low   GOALS: Goals reviewed with patient? No  SHORT TERM GOALS: Target date: 08/27/2023  Pt will be independent with HEP in order to improve strength and decrease pain in order to improve pain-free function at home and work.  Baseline: NT Goal status: INITIAL  LONG TERM GOALS: Target date: 10/22/2023  Pt will decrease their KOOS score by at least 10 points to show significant increase in knee function.  Baseline: 49/100 Goal status: INITIAL  2.  Pt will increase the strength of hip flexion and knee extension by at least 1/3 MMT grade (4- to 4) to demonstrate increase of strength and function.  Baseline: Hip flexion R/L 4-/4-; Knee flexion R/L 4*/4 Goal status: INITIAL  3.  Pt will have a negative Ely's test to decrease the tension from the quads on the knee joint and return to activities without knee pain.  Baseline: Positive bilaterally  Goal status: INITIAL   PLAN:  PT FREQUENCY: 1-2x/week  PT DURATION: 10 weeks  PLANNED INTERVENTIONS: 97110-Therapeutic exercises, 97530- Therapeutic activity, 97112- Neuromuscular re-education, 97535- Self Care, 54098- Manual therapy, [phone]- Gait training, Patient/Family education, Stair training, Taping, Joint mobilization, Joint manipulation, DME instructions, Cryotherapy, Moist heat, and Biofeedback  PLAN FOR NEXT SESSION: Quad strengthening. Assess stairs and hamstring length. Marches with resistance.  Walking on mat with obstacles. Squats.   [employer], SPT [employer] DPT  Ellin Goodie PT, DPT  [facility] [address], [place], [address] Phone: [phone]   Fax:  [phone]

## 2023-08-18 ENCOUNTER — Encounter: Payer: Self-pay | Admitting: Physician Assistant

## 2023-08-18 ENCOUNTER — Ambulatory Visit: Payer: Medicare Other | Admitting: Physician Assistant

## 2023-08-18 VITALS — BP 136/88 | HR 61 | Ht 68.0 in | Wt 205.3 lb

## 2023-08-18 DIAGNOSIS — M069 Rheumatoid arthritis, unspecified: Secondary | ICD-10-CM | POA: Insufficient documentation

## 2023-08-18 DIAGNOSIS — R600 Localized edema: Secondary | ICD-10-CM | POA: Diagnosis not present

## 2023-08-18 DIAGNOSIS — H04123 Dry eye syndrome of bilateral lacrimal glands: Secondary | ICD-10-CM | POA: Diagnosis not present

## 2023-08-18 DIAGNOSIS — E669 Obesity, unspecified: Secondary | ICD-10-CM | POA: Diagnosis not present

## 2023-08-18 DIAGNOSIS — I10 Essential (primary) hypertension: Secondary | ICD-10-CM

## 2023-08-18 DIAGNOSIS — Z6831 Body mass index (BMI) 31.0-31.9, adult: Secondary | ICD-10-CM

## 2023-08-18 MED ORDER — HYDROCHLOROTHIAZIDE 12.5 MG PO CAPS
12.5000 mg | ORAL_CAPSULE | Freq: Every day | ORAL | 0 refills | Status: DC
Start: 2023-08-18 — End: 2023-11-16

## 2023-08-18 NOTE — Progress Notes (Signed)
 Established patient visit  Patient: Ralph Doyle   DOB: Dec 22, 1950   73 y.o. Male  MRN: [id number] Visit Date: 08/18/2023  Today's healthcare provider: Debera Lat, PA-C   Chief Complaint  Patient presents with   Hypertension    Pt has his reading with him, pt was checking his b/p  about once a week at first at first but  not know he is checking once a month he is said that the range is at 130/60's    Subjective      Discussed the use of AI scribe software for clinical note transcription with the patient, who gave verbal consent to proceed.  History of Present Illness   The patient, with a history of hypertension and rheumatoid arthritis, presents with concerns about elevated blood pressure readings. He reports that his readings have been similar to the one taken during the visit (136/88). The patient is currently on amlodipine 5mg  and valsartan 40mg  for blood pressure management.  The patient also reports noticing swelling in his feet for the first time the previous night. He mentions being active and trying to walk a lot. The swelling was not present during the visit.  The patient has been experiencing pain in his knees and has been referred to physical therapy by his rheumatologist. He reports that the pain is not excruciating and that he has started physical therapy the previous week.  In addition to the knee pain, the patient reports a recent onset of pain in his wrist, which started the day before the visit. The pain is described as sharp and occurs with certain movements. The patient also mentions a feeling of numbness in his arm that resolved on its own.  The patient also reports frequent urination, including waking up at night to use the bathroom. He mentions this has been a recent change.  The patient also mentions a family history of diabetes but denies having any personal history of elevated blood sugar levels.           08/18/2023    9:05 AM 05/18/2023    9:26 AM  05/04/2023    1:12 PM  Depression screen PHQ 2/9  Decreased Interest 2 0 0  Down, Depressed, Hopeless 2 1 1   PHQ - 2 Score 4 1 1   Altered sleeping 1 1 1   Tired, decreased energy 1 0 0  Change in appetite 1 0 0  Feeling bad or failure about yourself  1 0 0  Trouble concentrating 1 0 0  Moving slowly or fidgety/restless 1 0 0  Suicidal thoughts 0 0 0  PHQ-9 Score 10 2 2   Difficult doing work/chores Somewhat difficult Somewhat difficult Somewhat difficult      08/18/2023    9:06 AM 05/18/2023    9:26 AM  GAD 7 : Generalized Anxiety Score  Nervous, Anxious, on Edge 0 0  Control/stop worrying 0 0  Worry too much - different things 1 1  Trouble relaxing 1 1  Restless 1 0  Easily annoyed or irritable 0 0  Afraid - awful might happen 1 1  Total GAD 7 Score 4 3  Anxiety Difficulty Somewhat difficult Somewhat difficult    Medications: Outpatient Medications Prior to Visit  Medication Sig   amLODipine (NORVASC) 5 MG tablet Take 5 mg by mouth daily.   atorvastatin (LIPITOR) 10 MG tablet Take 10 mg by mouth daily.   augmented betamethasone dipropionate (DIPROLENE-AF) 0.05 % ointment Apply 1 application topically daily as needed for rash.   bacitracin  ointment Apply 1 application topically every 8 (eight) hours.   folic acid (FOLVITE) 1 MG tablet TAKE 1 TABLET BY MOUTH DAILY   levothyroxine (SYNTHROID) 112 MCG tablet Take 112 mcg by mouth daily.   methotrexate (RHEUMATREX) 2.5 MG tablet TAKE 5 TABLETS (12.5 MG TOTAL) BY MOUTH ONCE WEEKLY. CAUTION: CHEMOTHERAPY. PROTECT FROM LIGHT.   OVER THE COUNTER MEDICATION Apply 1 application topically daily as needed (pain). CBD cream   Polyethyl Glycol-Propyl Glycol (SYSTANE ULTRA OP) Place 1 drop into both eyes daily as needed (dry eyes).   Turmeric 400 MG CAPS Take as needed per directions   valsartan (DIOVAN) 40 MG tablet Take 1 tablet (40 mg total) by mouth daily.   No facility-administered medications prior to visit.    Review of  Systems All negative Except see HPI       Objective    BP 136/88   Pulse 61   Ht 5\' 8"  (1.727 m)   Wt 205 lb 4.8 oz (93.1 kg)   SpO2 100% Comment: room air  BMI 31.22 kg/m     Physical Exam Vitals reviewed.  Constitutional:      General: He is not in acute distress.    Appearance: Normal appearance. He is not diaphoretic.  HENT:     Head: Normocephalic and atraumatic.  Eyes:     General: No scleral icterus.    Conjunctiva/sclera: Conjunctivae normal.  Cardiovascular:     Rate and Rhythm: Normal rate and regular rhythm.     Pulses: Normal pulses.     Heart sounds: Normal heart sounds. No murmur heard. Pulmonary:     Effort: Pulmonary effort is normal. No respiratory distress.     Breath sounds: Normal breath sounds. No wheezing or rhonchi.  Musculoskeletal:     Cervical back: Neck supple.     Right lower leg: No edema.     Left lower leg: No edema.  Lymphadenopathy:     Cervical: No cervical adenopathy.  Skin:    General: Skin is warm and dry.     Findings: No rash.  Neurological:     Mental Status: He is alert and oriented to person, place, and time. Mental status is at baseline.  Psychiatric:        Mood and Affect: Mood normal.        Behavior: Behavior normal.      No results found for any visits on 08/18/23.      Assessment and Plan    Primary hypertension (Primary) - Lipid panel - TSH - hydrochlorothiazide (MICROZIDE) 12.5 MG capsule; Take 1 capsule (12.5 mg total) by mouth daily.  Dispense: 90 capsule; Refill: 0  Obesity (BMI 30-39.9) - Lipid panel - TSH  Hypertension Chronic, unstable Blood pressure readings consistently above the ideal range. Currently on Amlodipine 5mg  and Valsartan 40mg . -Add 12.5mg  water pill to current regimen. -Monitor blood pressure daily, alternating between morning and evening readings. -Target blood pressure is less than 130/90, ideally 120/80. -Follow-up in 2-4 weeks to assess blood pressure control. Will  check bmp at the next appointment  Lower extremity edema First-time observation of swollen feet. No associated symptoms reported. -Add 12.5mg  water pill to current regimen. -Recommend use of compression socks when standing or walking. -Continue to monitor for changes.  Dry Eye Syndrome Chronic issue, worsening recently. Currently using Systane eye drops. -Schedule appointment with ophthalmologist for further evaluation and management. -Continue using Systane eye drops.  Rheumatoid Arthritis Positive rheumatoid factor. Recent onset of wrist pain. Currently  undergoing physical therapy for knee pain. -Continue physical therapy for knee pain. -Communicate new onset of wrist pain to rheumatologist for further evaluation and management. -Consider physical therapy for wrist pain if recommended by rheumatologist. -Avoid NSAIDs due to potential kidney function impairment.  Depression Reports of feeling somewhat depressed. -Consider discussing further at next visit if symptoms persist or worsen.   Obesity  Chronic, ass with htn Weight loss of 5% of pt's current weight via healthy diet and daily exercise encouraged. Will follow-up Ordered tsh lp  General Health Maintenance -Continue healthy diet and exercise. -Drink 8-10 glasses of water daily. -Check blood sugar due to family history of diabetes. -Repeat lipid panel and other necessary labs. -Schedule annual wellness visit.      Orders Placed This Encounter  Procedures   Lipid panel    Has the patient fasted?:   Yes   TSH    Return in about 4 weeks (around 09/15/2023) for BP f/u.   The patient was advised to call back or seek an in-person evaluation if the symptoms worsen or if the condition fails to improve as anticipated.  I discussed the assessment and treatment plan with the patient. The patient was provided an opportunity to ask questions and all were answered. The patient agreed with the plan and demonstrated an  understanding of the instructions.  I, Debera Lat, PA-C have reviewed all documentation for this visit. The documentation on 08/18/2023  for the exam, diagnosis, procedures, and orders are all accurate and complete.  Debera Lat, [facility], MMS [facility] [phone] (phone) [phone] (fax)  [facility]

## 2023-08-20 ENCOUNTER — Ambulatory Visit: Payer: Medicare Other | Admitting: Physical Therapy

## 2023-08-21 ENCOUNTER — Ambulatory Visit: Payer: Self-pay | Admitting: Physician Assistant

## 2023-08-21 NOTE — Telephone Encounter (Signed)
 Called, left VM, first call back attempt.   Copied from CRM [id number]. Topic: Clinical - Medication Question >> Aug 21, 2023  8:32 AM Payton Doughty wrote: Reason for CRM: pt would like to know when to take his  hydrochlorothiazide (MICROZIDE) 12.5 MG capsule.  Edmon Crape just prescribed on 2/18 and it does not mention when to take it in her notes at all.

## 2023-08-21 NOTE — Telephone Encounter (Signed)
 Clarified with patient what time to take his medication as a daily medication. Patient states he hadn't taken his medication yet but he would do that once he got home. Patient did state he had another question but was unable to remember. Patient encouraged to call back at anytime with any additional questions.   Reason for Disposition  Health Information question, no triage required and triager able to answer question  Answer Assessment - Initial Assessment Questions 1. REASON FOR CALL or QUESTION: "What is your reason for calling today?" or "How can I best help you?" or "What question do you have that I can help answer?"     Patient called to clarify when to take his Hydrochlorothiazide medication. Medication was prescribed Daily. Time to take medication was clarified with patient. Patient currently in the car and will take his medication when he gets home.  Protocols used: Information Only Call - No Triage-A-AH

## 2023-08-24 ENCOUNTER — Ambulatory Visit: Payer: Medicare Other | Admitting: Physical Therapy

## 2023-08-24 ENCOUNTER — Encounter: Payer: Self-pay | Admitting: Physical Therapy

## 2023-08-24 DIAGNOSIS — G8929 Other chronic pain: Secondary | ICD-10-CM

## 2023-08-24 DIAGNOSIS — M25561 Pain in right knee: Secondary | ICD-10-CM | POA: Diagnosis not present

## 2023-08-24 NOTE — Therapy (Unsigned)
 OUTPATIENT PHYSICAL THERAPY LOWER EXTREMITY TREATMENT   Patient Name: Ralph Doyle MRN: [id number] DOB:03-17-1951, 73 y.o., male Today's Date: 08/24/2023  END OF SESSION:  PT End of Session - 08/24/23 1647     Visit Number 2    Number of Visits 20    Date for PT Re-Evaluation 10/22/23    Authorization Type Medicare [id number]    Authorization - Visit Number 1    Authorization - Number of Visits 20    Progress Note Due on Visit 10    PT Start Time 1647    PT Stop Time 1730    PT Time Calculation (min) 43 min    Activity Tolerance Patient tolerated treatment well    Behavior During Therapy WFL for tasks assessed/performed             Past Medical History:  Diagnosis Date   Cancer (HCC)    Thyroid   Hyperlipidemia    Hypertension    Rheumatoid arthritis (HCC)    Past Surgical History:  Procedure Laterality Date   COLONOSCOPY WITH PROPOFOL     COLONOSCOPY WITH PROPOFOL N/A 10/08/2021   Procedure: COLONOSCOPY WITH PROPOFOL;  Surgeon: Midge Minium, MD;  Location: [facility];  Service: Endoscopy;  Laterality: N/A;   MANDIBLE SURGERY     THYROIDECTOMY Bilateral 05/16/2020   Procedure: TOTAL THYROIDECTOMY;  Surgeon: Geanie Logan, MD;  Location: [facility] ORS;  Service: ENT;  Laterality: Bilateral;   Patient Active Problem List   Diagnosis Date Noted   Obesity (BMI 30-39.9) 08/18/2023   Dry eye syndrome of both eyes 08/18/2023   Rheumatoid arthritis (HCC) 08/18/2023   Primary hypertension 05/05/2023   Other hyperlipidemia 05/05/2023   Radial styloid tenosynovitis (de quervain) 11/04/2022   Dupuytren's contracture of both hands 05/07/2022   Neck pain 10/30/2021   Positive colorectal cancer screening using Cologuard test    Polyp of transverse colon    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   Pain in right hand 10/12/2020   Pain of left hand 10/12/2020   Flexor tenosynovitis of finger 08/15/2020   Rheumatoid arthritis with rheumatoid factor of multiple sites without  organ or systems involvement (HCC) 06/14/2020   High risk medication use 06/14/2020   Thyroid malignant neoplasm (HCC) 05/16/2020    PCP: Debera Lat, PA-C  REFERRING PROVIDER: Fuller Plan, MD  REFERRING DIAG: M25.561,M25.562,G89.29 (ICD-10-CM) - Chronic pain of both knees  THERAPY DIAG:  Chronic pain of right knee  Chronic pain of left knee  Rationale for Evaluation and Treatment: Rehabilitation  ONSET DATE: 4 years   SUBJECTIVE:   SUBJECTIVE STATEMENT: Pt says that his knees haven't been bothering him too much. He says it has been from squatting but sometimes he doesn't even notice what causes the pain or if it is happening. Coming in with a little pain in her left knee from walking around today.   PERTINENT HISTORY: Pt has been having bilateral knee pain (R>L) while walking over the past 3-4 years. He says the pain comes on when he takes a step incorrectly or when he ascends or descends stairs. Pt reports no numbness or tingling and the pain doesn't travel above or below the knee but he has had some lateral hip pain. Pt got x-rays and the doctor said surgery wasn't warranted and PT could help. Pt said it isn't constantly hurting, it comes and goes depending on how he is walking. He has not tried any pain medication and the pain goes away on its own. He reports  that he is not limited functionally. He likes to play golf and go on walks and has only noticed some pain with these activities, but not enough to stop.   PAIN:  Are you having pain? Yes: NPRS scale: 7/10  Pain location: anterior, surrounding kneecap, travels a little laterally  Pain description: sharp pain comes on then goes away (R>L), achey  Aggravating factors: walking, stairs  Relieving factors: rest , icy hot  PRECAUTIONS: None  RED FLAGS: None   WEIGHT BEARING RESTRICTIONS: No  FALLS:  Has patient fallen in last 6 months? Yes. Number of falls 1, off a ladder but did not injure himself   LIVING  ENVIRONMENT: Lives with: lives with their family and lives with their spouse Lives in: House/apartment Stairs: Yes: Internal: 12 steps; on right going up Has following equipment at home: None  OCCUPATION: Retired  PLOF: Independent  PATIENT GOALS: to be stronger and have less pain  NEXT MD VISIT: 3 months  OBJECTIVE:  Note: Objective measures were completed at Evaluation unless otherwise noted.  Vitals: BP: 141/86 HR: 60 SpO2: 99  DIAGNOSTIC FINDINGS:  X-ray left knee 3 views   Medial and lateral compartment joint space are preserved possibly small  marginal and posterior osteophyte.  Mild patellofemoral joint space  narrowing with subchondral cystic changes and lateral osteophyte.  No  visible effusion or abnormal calcification.   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  Probable patellofemoral joint space narrowing and mild  lateral deviation of patella.  Small superior patellar enthesophyte.  No  visible effusion or abnormal calcification.   Impression   Mild tricompartmental osteoarthritis, superior patellar enthesophyte may  be consistent with patellofemoral arthritis or chronic quadriceps  Tendinopathy  PATIENT SURVEYS:  KOOS:  70/100 (change from eval)  COGNITION: Overall cognitive status: Within functional limits for tasks assessed     SENSATION: WFL  MUSCLE LENGTH: Hamstrings: Right NT deg; Left NT deg  POSTURE: rounded shoulders, forward head, and flexed trunk   PALPATION: No tenderness  LOWER EXTREMITY ROM:  Active ROM Right eval Left eval  Hip flexion    Hip extension    Hip abduction    Hip adduction    Hip internal rotation    Hip external rotation    Knee flexion 130/130  130/130  Knee extension 0/0 0/0  Ankle dorsiflexion    Ankle plantarflexion    Ankle inversion    Ankle eversion     (Blank rows = not tested)  LOWER EXTREMITY MMT:  MMT Right eval Left eval  Hip flexion 4- 4-  Hip extension 4+ 4+  Hip abduction 4+ 4+  Hip adduction 4+ 4+  Hip internal rotation 5 5  Hip external rotation 5 5  Knee flexion 5 5  Knee extension 4* 4  Ankle dorsiflexion    Ankle plantarflexion    Ankle inversion    Ankle eversion     (Blank rows = not tested)  LOWER EXTREMITY SPECIAL TESTS:  Hip special tests: Ely's test: positive  Knee special tests: Step up/down test: negative and Valgus/varus test: negative   FUNCTIONAL TESTS:  6 minute walk test: 1950 ft; pt reported no increase in knee pain  GAIT: Distance walked: 1950 ft Assistive device utilized: None Level of assistance: Complete  Independence Comments: normal                                                                                                               TREATMENT DATE:  08/24/23 TherEx Nu step at seat 10 with resistance 3 for 6 minutes  Stairs with 1 UE support x4 up and down   - pt reported increase of bilateral knee pain Long arc quads with green band 1x10 Long arc quads with blue band 1x10 Long arc quads with black band 1x10  Standing marches with BUE support with blue band 1x10ea Standing marches with BUE support with black band 1x10ea Squats with TRX band to chair 1x10, 1x15  Squats with TRX band 1x10  -pt instructed to go to point before it starts to hurt    08/13/23  Prone Quad stretch 1x60sec each side  PATIENT EDUCATION:  Education details: Form and technique for correct performance of exercise and explanation about exam findings  Person educated: Patient Education method: Explanation, Verbal cues, and Handouts Education comprehension: verbalized understanding and returned demonstration  HOME EXERCISE PROGRAM: Access Code: [email or web address] URL:  [email or web address] Date: 08/24/2023 Prepared by: Consuelo Pandy  Exercises - Prone Quadriceps Stretch with Strap  - 1 x daily - 7 x weekly - 3 sets - 10 reps - 60 sec hold - Seated Knee Extension with Resistance  - 1 x daily - 3-4 x weekly - 3 sets - 10 reps - Marching with Resistance  - 1 x daily - 3-4 x weekly - 3 sets - 10 reps  ASSESSMENT:  CLINICAL IMPRESSION: Patient has increased pain with deep flexion in squats and ascending/descending stairs but he is not limited in any exercises due to pain. Despite having limitations in hip flexion and knee extension, he was able to progress quad exercises with high resistance and may benefit from using gym equipment to progress strength. Pt will benefit from further quad strengthening to decrease bilateral knee pain to complete exercises and activities that require bending.   OBJECTIVE IMPAIRMENTS: decreased activity tolerance, difficulty walking, decreased strength, impaired flexibility, and pain.   ACTIVITY LIMITATIONS: bending, sitting, standing, squatting, stairs, and bed mobility, walking  PARTICIPATION LIMITATIONS:  N/A  PERSONAL FACTORS: Age, Past/current experiences, Time since onset of injury/illness/exacerbation, and 1-2 comorbidities: HTN and RA  are also affecting patient's functional outcome.   REHAB POTENTIAL: Excellent  CLINICAL DECISION MAKING: Stable/uncomplicated  EVALUATION COMPLEXITY: Low   GOALS: Goals reviewed with patient? No  SHORT TERM GOALS: Target date: 08/27/2023  Pt will be independent with HEP in order to improve strength and decrease pain in order to improve pain-free function at home and work.  Baseline: NT Goal status: ONGOING  LONG TERM GOALS: Target date: 10/22/2023  Pt will decrease their KOOS score by at least 10 points to show significant increase in knee function.  Baseline: 70%  Goal status: ONGOING  2.  Pt will increase the strength of hip flexion and knee extension by  at least 1/3 MMT grade (4- to 4) to demonstrate increase  of strength and function.  Baseline: Hip flexion R/L 4-/4-; Knee extension R/L 4*/4 Goal status: ONGOING  3.  Pt will have a negative Ely's test to decrease the tension from the quads on the knee joint and return to activities without knee pain.  Baseline: Positive bilaterally  Goal status: ONGOING   PLAN:  PT FREQUENCY: 1-2x/week  PT DURATION: 10 weeks  PLANNED INTERVENTIONS: 97110-Therapeutic exercises, 97530- Therapeutic activity, 97112- Neuromuscular re-education, 97535- Self Care, 16109- Manual therapy, [phone]- Gait training, Patient/Family education, Stair training, Taping, Joint mobilization, Joint manipulation, DME instructions, Cryotherapy, Moist heat, and Biofeedback  PLAN FOR NEXT SESSION: Squats, maybe with weight. Leg press. OMEGA leg extensions. Forward step ups.   [employer], SPT [employer] DPT  Ellin Goodie PT, DPT  [facility] [address] [place], [address] Phone: [phone]   Fax:  [phone] Dena Billet, DPT  [facility] [address] [place], [address] Phone: [phone]   Fax:  [phone]

## 2023-08-26 ENCOUNTER — Ambulatory Visit: Payer: Medicare Other

## 2023-08-26 DIAGNOSIS — M25561 Pain in right knee: Secondary | ICD-10-CM | POA: Diagnosis not present

## 2023-08-26 DIAGNOSIS — G8929 Other chronic pain: Secondary | ICD-10-CM

## 2023-08-26 NOTE — Therapy (Signed)
 OUTPATIENT PHYSICAL THERAPY LOWER EXTREMITY TREATMENT   Patient Name: Ralph Doyle MRN: [id number] DOB:[birth year], 73 y.o., male Today's Date: 08/26/2023  END OF SESSION:  PT End of Session - 08/26/23 1644     Visit Number 3    Number of Visits 20    PT Start Time 1645    PT Stop Time 1729    PT Time Calculation (min) 44 min    Activity Tolerance Patient tolerated treatment well    Behavior During Therapy WFL for tasks assessed/performed             Past Medical History:  Diagnosis Date   Cancer (HCC)    Thyroid   Hyperlipidemia    Hypertension    Rheumatoid arthritis (HCC)    Past Surgical History:  Procedure Laterality Date   COLONOSCOPY WITH PROPOFOL     COLONOSCOPY WITH PROPOFOL N/A 10/08/2021   Procedure: COLONOSCOPY WITH PROPOFOL;  Surgeon: Midge Minium, MD;  Location: [facility];  Service: Endoscopy;  Laterality: N/A;   MANDIBLE SURGERY     THYROIDECTOMY Bilateral 05/16/2020   Procedure: TOTAL THYROIDECTOMY;  Surgeon: Geanie Logan, MD;  Location: [facility];  Service: ENT;  Laterality: Bilateral;   Patient Active Problem List   Diagnosis Date Noted   Obesity (BMI 30-39.9) 08/18/2023   Dry eye syndrome of both eyes 08/18/2023   Rheumatoid arthritis (HCC) 08/18/2023   Primary hypertension 05/05/2023   Other hyperlipidemia 05/05/2023   Radial styloid tenosynovitis (de quervain) 11/04/2022   Dupuytren's contracture of both hands 05/07/2022   Neck pain 10/30/2021   Positive colorectal cancer screening using Cologuard test    Polyp of transverse colon    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   Pain in right hand 10/12/2020   Pain of left hand 10/12/2020   Flexor tenosynovitis of finger 08/15/2020   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 He says that he was walking around a good amount yesterday.  PERTINENT HISTORY: Pt has been having bilateral knee pain (R>L) while walking over the past 3-4 years. He says the pain comes on when he takes a step incorrectly or when he ascends or descends stairs. Pt reports no numbness or tingling and the pain doesn't travel above or below the knee but he has had some lateral hip pain. Pt got x-rays and the doctor said surgery wasn't warranted and PT could help. Pt said it isn't constantly hurting, it comes and goes depending on how he is walking. He has not tried any pain medication and the pain goes away on its own. He reports that he is not limited functionally. He likes to play golf and go on walks and has only noticed some pain with these activities, but not enough to stop.   PAIN:  Are you having pain? Yes: NPRS scale: 7/10  Pain location: anterior, surrounding kneecap, travels a little laterally  Pain description: sharp pain comes on then goes away (R>L), achey  Aggravating factors:  walking, stairs  Relieving factors: rest , icy hot  PRECAUTIONS: None  RED FLAGS: None   WEIGHT BEARING RESTRICTIONS: No  FALLS:  Has patient fallen in last 6 months? Yes. Number of falls 1, off a ladder but did not injure himself   LIVING ENVIRONMENT: Lives with: lives with their family and lives with their spouse Lives in: House/apartment Stairs: Yes: Internal: 12 steps; on right going up Has following equipment at home: None  OCCUPATION: Retired  PLOF: Independent  PATIENT GOALS: to be stronger and have less pain  NEXT MD VISIT: 3  months  OBJECTIVE:  Note: Objective measures were completed at Evaluation unless otherwise noted.  Vitals: BP: 141/86 HR: 60 SpO2: 99  DIAGNOSTIC FINDINGS:  X-ray left knee 3 views   Medial and lateral compartment joint space are preserved possibly small  marginal and posterior osteophyte.  Mild patellofemoral joint space  narrowing with subchondral cystic changes and lateral osteophyte.  No  visible effusion or abnormal calcification.   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  Probable patellofemoral joint space narrowing and mild  lateral deviation of patella.  Small superior patellar enthesophyte.  No  visible effusion or abnormal calcification.   Impression   Mild tricompartmental osteoarthritis, superior patellar enthesophyte may  be consistent with patellofemoral arthritis or chronic quadriceps  Tendinopathy  PATIENT SURVEYS:  KOOS:  70/100 (change from eval)  COGNITION: Overall cognitive status: Within functional limits for tasks assessed     SENSATION: WFL  MUSCLE LENGTH: Hamstrings: Right NT deg; Left NT deg  POSTURE: rounded shoulders, forward head, and flexed trunk   PALPATION: No tenderness  LOWER EXTREMITY ROM:  Active ROM Right eval Left eval  Hip flexion    Hip extension    Hip abduction    Hip adduction    Hip internal rotation    Hip external rotation    Knee flexion 130/130 130/130  Knee extension 0/0 0/0  Ankle dorsiflexion    Ankle plantarflexion    Ankle inversion    Ankle eversion     (Blank rows = not tested)  LOWER EXTREMITY MMT:  MMT Right eval Left eval  Hip flexion 4- 4-  Hip extension 4+ 4+  Hip abduction 4+ 4+  Hip adduction 4+ 4+  Hip internal rotation  5 5  Hip external rotation 5 5  Knee flexion 5 5  Knee extension 4* 4  Ankle dorsiflexion    Ankle plantarflexion    Ankle inversion    Ankle eversion     (Blank rows = not tested)  LOWER EXTREMITY SPECIAL TESTS:  Hip special tests: Ely's test: positive  Knee special tests: Step up/down test: negative and Valgus/varus test: negative   FUNCTIONAL TESTS:  6 minute walk test: 1950 ft; pt reported no increase in knee pain  GAIT: Distance walked: 1950 ft Assistive device utilized: None Level of assistance: Complete Independence Comments: normal  TREATMENT DATE:  08/26/23 TherEx  Nu step at seat 9 with resistance 3 for 6 minutes  Sit to stand to mat 1x10  Sit to stand to mat with water gallon 2x10  Modified thomas stretch 1x60 sec  Forward step ups 1x10 each leg  Backwards step downs 1x10 each leg  -pt reports increased left knee crepitus when stepping down with RLE  Leg Extensions #45 3x10   -pt instructed to stop before 0 extension when pain starts   -pt reports increased painless left knee crepitus OMEGA leg press #45 1x10  OMEGA leg press #65 1x10  OMEGA leg press #85 1x10   OMEGA leg press #105 1x20 Sit to stand with #15 DB 1x10  Sit to stand with #25 plate [id number]  07/18/12 TherEx Nu step at seat 10 with resistance 3 for 6 minutes  Stairs with 1 UE support x4 up and down   - pt reported increase of bilateral knee pain Long arc quads with green band 1x10 Long arc quads with blue band 1x10 Long arc quads with black band 1x10  Standing marches with BUE support with blue band 1x10ea Standing marches with BUE support with black band 1x10ea Squats with TRX band to chair 1x10, 1x15  Squats with TRX band 1x10  -pt instructed to go to point before it starts to hurt   08/13/23  Prone Quad stretch 1x60sec each side  PATIENT EDUCATION:  Education details: Form and  technique for correct performance of exercise and explanation about exam findings  Person educated: Patient Education method: Explanation, Verbal cues, and Handouts Education comprehension: verbalized understanding and returned demonstration  HOME EXERCISE PROGRAM: Access Code: [email or web address] URL: [email or web address] Date: 08/26/2023 Prepared by: Consuelo Pandy  Exercises - Modified Thomas Stretch  - 1 x daily - 7 x weekly - 3 sets - 60sec hold - Seated Knee Extension with Resistance  - 1 x daily - 3-4 x weekly - 3 sets - 10 reps - Marching with Resistance  - 1 x daily - 3-4 x weekly - 3 sets - 10 reps - Goblet Squat with Kettlebell  - 1 x daily - 3-4 x weekly - 3 sets - 10 reps  ASSESSMENT:  CLINICAL IMPRESSION: Patient has progressed in LE strength and was not limited by pain during his session. He experienced crepitus due to his arthritis, but it was not painful within limited knee range so it did not interfere with exercises. Pt will benefit from further PT to strengthen both LE to be able to walk for longer distances and go up and down stairs without an increase of bilateral knee pain.   OBJECTIVE IMPAIRMENTS: decreased activity tolerance, difficulty walking, decreased strength, impaired flexibility, and pain.   ACTIVITY LIMITATIONS: bending, sitting, standing, squatting, stairs, and bed mobility, walking  PARTICIPATION LIMITATIONS:  N/A  PERSONAL FACTORS: Age, Past/current experiences, Time since onset of injury/illness/exacerbation, and 1-2 comorbidities: HTN and RA  are also affecting patient's functional outcome.   REHAB POTENTIAL: Excellent  CLINICAL DECISION MAKING: Stable/uncomplicated  EVALUATION COMPLEXITY: Low   GOALS: Goals reviewed with patient? No  SHORT TERM GOALS: Target date: 08/27/2023  Pt will be independent with HEP in order to improve strength and decrease pain in order to improve pain-free function at home and work.  Baseline: NT Goal  status: ONGOING  LONG TERM GOALS: Target date: 10/22/2023  Pt will decrease their KOOS score by at least 10 points to show significant increase in knee function.  Baseline: 70%  Goal status: ONGOING  2.  Pt will increase the strength of hip flexion and knee extension by at least 1/3 MMT grade (4- to 4) to demonstrate increase of strength and function.  Baseline: Hip flexion R/L 4-/4-; Knee extension R/L 4*/4 Goal status: ONGOING  3.  Pt will have a negative Ely's test to decrease the tension from the quads on the knee joint and return to activities without knee pain.  Baseline: Positive bilaterally  Goal status: ONGOING   PLAN:  PT FREQUENCY: 1-2x/week  PT DURATION: 10 weeks  PLANNED INTERVENTIONS: 97110-Therapeutic exercises, 97530- Therapeutic activity, 97112- Neuromuscular re-education, 97535- Self Care, 14782- Manual therapy, [phone]- Gait training, Patient/Family education, Stair training, Taping, Joint mobilization, Joint manipulation, DME instructions, Cryotherapy, Moist heat, and Biofeedback  PLAN FOR NEXT SESSION: Squats without the mat. Lunges.   Ralph Doyle, SPT Ralph Doyle  7:13 PM, 08/26/23 Ralph Doyle, PT, [facility] Physical Therapist - [place] [phone] (Office)

## 2023-09-01 ENCOUNTER — Ambulatory Visit: Payer: Medicare Other | Attending: Internal Medicine | Admitting: Physical Therapy

## 2023-09-01 ENCOUNTER — Telehealth: Payer: Self-pay | Admitting: Physical Therapy

## 2023-09-01 DIAGNOSIS — G8929 Other chronic pain: Secondary | ICD-10-CM | POA: Insufficient documentation

## 2023-09-01 DIAGNOSIS — M25562 Pain in left knee: Secondary | ICD-10-CM | POA: Insufficient documentation

## 2023-09-01 DIAGNOSIS — M25561 Pain in right knee: Secondary | ICD-10-CM | POA: Insufficient documentation

## 2023-09-01 NOTE — Telephone Encounter (Signed)
 Called pt to inquire about absence from PT. Did not reach pt but left message instructing pt to call back to reschedule apt.

## 2023-09-03 ENCOUNTER — Ambulatory Visit: Payer: Medicare Other | Admitting: Physical Therapy

## 2023-09-03 DIAGNOSIS — G8929 Other chronic pain: Secondary | ICD-10-CM

## 2023-09-03 DIAGNOSIS — M25561 Pain in right knee: Secondary | ICD-10-CM | POA: Diagnosis present

## 2023-09-03 DIAGNOSIS — M25562 Pain in left knee: Secondary | ICD-10-CM | POA: Diagnosis present

## 2023-09-03 NOTE — Therapy (Signed)
 OUTPATIENT PHYSICAL THERAPY LOWER EXTREMITY TREATMENT   Patient Name: Ralph Doyle MRN: [id number] DOB:1950/07/04, 73 y.o., male Today's Date: 09/03/2023  END OF SESSION:  PT End of Session - 09/03/23 1117     Visit Number 4    Number of Visits 20    Date for PT Re-Evaluation 10/22/23    Authorization Type Medicare 2025    Authorization - Visit Number 4    Authorization - Number of Visits 20    Progress Note Due on Visit 10    PT Start Time 1115    PT Stop Time 1200    PT Time Calculation (min) 45 min    Activity Tolerance Patient tolerated treatment well    Behavior During Therapy WFL for tasks assessed/performed             Past Medical History:  Diagnosis Date   Cancer (HCC)    Thyroid   Hyperlipidemia    Hypertension    Rheumatoid arthritis (HCC)    Past Surgical History:  Procedure Laterality Date   COLONOSCOPY WITH PROPOFOL     COLONOSCOPY WITH PROPOFOL N/A 10/08/2021   Procedure: COLONOSCOPY WITH PROPOFOL;  Surgeon: Midge Minium, MD;  Location: [facility] ENDOSCOPY;  Service: Endoscopy;  Laterality: N/A;   MANDIBLE SURGERY     THYROIDECTOMY Bilateral 05/16/2020   Procedure: TOTAL THYROIDECTOMY;  Surgeon: Geanie Logan, MD;  Location: [facility] ORS;  Service: ENT;  Laterality: Bilateral;   Patient Active Problem List   Diagnosis Date Noted   Obesity (BMI 30-39.9) 08/18/2023   Dry eye syndrome of both eyes 08/18/2023   Rheumatoid arthritis (HCC) 08/18/2023   Primary hypertension 05/05/2023   Other hyperlipidemia 05/05/2023   Radial styloid tenosynovitis (de quervain) 11/04/2022   Dupuytren's contracture of both hands 05/07/2022   Neck pain 10/30/2021   Positive colorectal cancer screening using Cologuard test    Polyp of transverse colon    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   Pain in right hand 10/12/2020   Pain of left hand 10/12/2020   Flexor tenosynovitis of finger 08/15/2020   Rheumatoid arthritis with rheumatoid factor of multiple sites without  organ or systems involvement (HCC) 06/14/2020   High risk medication use 06/14/2020   Thyroid malignant neoplasm (HCC) 05/16/2020    PCP: Debera Lat, PA-C  REFERRING PROVIDER: Fuller Plan, MD  REFERRING DIAG: M25.561,M25.562,G89.29 (ICD-10-CM) - Chronic pain of both knees  THERAPY DIAG:  Chronic pain of right knee  Chronic pain of left knee  Rationale for Evaluation and Treatment: Rehabilitation  ONSET DATE: 4 years   SUBJECTIVE:   SUBJECTIVE STATEMENT: Pt reports ongoing improvement in his knee pain. He continues to do his exercises without much difficulty. He describes experiencing most of his pain in his patella and he no longer feels pain in his right quad tendon.   PERTINENT HISTORY: Pt has been having bilateral knee pain (R>L) while walking over the past 3-4 years. He says the pain comes on when he takes a step incorrectly or when he ascends or descends stairs. Pt reports no numbness or tingling and the pain doesn't travel above or below the knee but he has had some lateral hip pain. Pt got x-rays and the doctor said surgery wasn't warranted and PT could help. Pt said it isn't constantly hurting, it comes and goes depending on how he is walking. He has not tried any pain medication and the pain goes away on its own. He reports that he is not limited functionally. He likes to  play golf and go on walks and has only noticed some pain with these activities, but not enough to stop.   PAIN:  Are you having pain? Yes: NPRS scale: 5/10  Pain location: Left anterior knee cap  Pain description: sharp pain comes on then goes away (R>L), achey  Aggravating factors: walking, stairs  Relieving factors: rest , icy hot  PRECAUTIONS: None  RED FLAGS: None   WEIGHT BEARING RESTRICTIONS: No  FALLS:  Has patient fallen in last 6 months? Yes. Number of falls 1, off a ladder but did not injure himself   LIVING ENVIRONMENT: Lives with: lives with their family and lives with  their spouse Lives in: House/apartment Stairs: Yes: Internal: 12 steps; on right going up Has following equipment at home: None  OCCUPATION: Retired  PLOF: Independent  PATIENT GOALS: to be stronger and have less pain  NEXT MD VISIT: 3 months  OBJECTIVE:  Note: Objective measures were completed at Evaluation unless otherwise noted.  Vitals: BP: 141/86 HR: 60 SpO2: 99  DIAGNOSTIC FINDINGS:  X-ray left knee 3 views   Medial and lateral compartment joint space are preserved possibly small  marginal and posterior osteophyte.  Mild patellofemoral joint space  narrowing with subchondral cystic changes and lateral osteophyte.  No  visible effusion or abnormal calcification.   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  Probable patellofemoral joint space narrowing and mild  lateral deviation of patella.  Small superior patellar enthesophyte.  No  visible effusion or abnormal calcification.   Impression   Mild tricompartmental osteoarthritis, superior patellar enthesophyte may  be consistent with patellofemoral arthritis or chronic quadriceps  Tendinopathy  PATIENT SURVEYS:  KOOS:  70/100 (change from eval)  COGNITION: Overall cognitive status: Within functional limits for tasks assessed     SENSATION: WFL  MUSCLE LENGTH: Hamstrings: Right NT deg; Left NT deg  POSTURE: rounded shoulders, forward head, and flexed trunk   PALPATION: No tenderness  LOWER EXTREMITY ROM:  Active ROM Right eval Left eval  Hip flexion    Hip extension    Hip abduction    Hip adduction    Hip internal rotation    Hip external rotation    Knee flexion 130/130 130/130  Knee extension 0/0 0/0  Ankle dorsiflexion    Ankle  plantarflexion    Ankle inversion    Ankle eversion     (Blank rows = not tested)  LOWER EXTREMITY MMT:  MMT Right eval Left eval  Hip flexion 4- 4-  Hip extension 4+ 4+  Hip abduction 4+ 4+  Hip adduction 4+ 4+  Hip internal rotation 5 5  Hip external rotation 5 5  Knee flexion 5 5  Knee extension 4* 4  Ankle dorsiflexion    Ankle plantarflexion    Ankle inversion    Ankle eversion     (Blank rows = not tested)  LOWER EXTREMITY SPECIAL TESTS:  Hip special tests: Ely's test: positive  Knee special tests: Step up/down test: negative and Valgus/varus test: negative   FUNCTIONAL TESTS:  6 minute walk test: 1950 ft; pt reported no increase in knee pain  GAIT: Distance walked: 1950 ft Assistive device utilized: None Level of assistance: Complete Independence Comments: normal  TREATMENT DATE:   09/03/23: THEREX  Nu-Step with seat at 9 with resistance at 2 for 6 min  Mini-Squat to 90 deg hip and knee flexion 1 x 10  -min VC to maintain knees behind toes  Mini-Squat to 60 deg knee flexion 1 x 10  Mini-Squat to 45 deg knee flexion 1 x 10  -26 inch height mat surface  -min VC to decrease speed down and come up quick  Reverse Lunges with BUE support  3 x 10  -min VC  Heel Raises 1 x 10  Heel Raises off 4 inch step 1 x 10  Heel Raises off 4 inch step with #10 x 2 DB 1 x 10   Pt educated on need to increase walking for improved cardiovascular health with introduction of walking program.  08/26/23 TherEx  Nu step at seat 9 with resistance 3 for 6 minutes  Sit to stand to mat 1x10  Sit to stand to mat with water gallon 2x10  Modified thomas stretch 1x60 sec  Forward step ups 1x10 each leg  Backwards step downs 1x10 each leg  -pt reports increased left knee crepitus when stepping down with RLE  Leg Extensions #45 3x10   -pt instructed to stop before 0 extension  when pain starts   -pt reports increased painless left knee crepitus OMEGA leg press #45 1x10  OMEGA leg press #65 1x10  OMEGA leg press #85 1x10   OMEGA leg press #105 1x20 Sit to stand with #15 DB 1x10  Sit to stand with #25 plate 1O10  9/60/45 TherEx Nu step at seat 10 with resistance 3 for 6 minutes  Stairs with 1 UE support x4 up and down   - pt reported increase of bilateral knee pain Long arc quads with green band 1x10 Long arc quads with blue band 1x10 Long arc quads with black band 1x10  Standing marches with BUE support with blue band 1x10ea Standing marches with BUE support with black band 1x10ea Squats with TRX band to chair 1x10, 1x15  Squats with TRX band 1x10  -pt instructed to go to point before it starts to hurt   08/13/23  Prone Quad stretch 1x60sec each side  PATIENT EDUCATION:  Education details: Form and technique for correct performance of exercise and explanation about exam findings  Person educated: Patient Education method: Explanation, Verbal cues, and Handouts Education comprehension: verbalized understanding and returned demonstration  HOME EXERCISE PROGRAM: Access Code: [email or web address] URL: [email or web address] Date: 09/03/2023 Prepared by: Ellin Goodie  Exercises - Modified Erby Pian  - 1 x daily - 7 x weekly - 3 sets - 60sec hold - Mini Squat  - 3-4 x weekly - 3 sets - 10 reps - Reverse Lunge  - 3-4 x weekly - 3 sets - 10 reps - Standing Bilateral Heel Raise on Step  - 3-4 x weekly - 3 sets - 10 reps  ASSESSMENT:  CLINICAL IMPRESSION: Home exercises modified to include decreased knee flexion to avoid exacerbation of knee pain. Pt demonstrates improved response to today's exercises compared to prior home exercises. Pt encouraged pt to participate in walking program and pt agreed to commit to 30 minutes per week for this next upcoming week.  Pt will benefit from further PT to strengthen both LE to be able to walk for longer  distances and go up and down stairs without an increase of bilateral knee pain.   OBJECTIVE IMPAIRMENTS: decreased activity tolerance, difficulty walking, decreased strength, impaired flexibility, and pain.  ACTIVITY LIMITATIONS: bending, sitting, standing, squatting, stairs, and bed mobility, walking  PARTICIPATION LIMITATIONS:  N/A  PERSONAL FACTORS: Age, Past/current experiences, Time since onset of injury/illness/exacerbation, and 1-2 comorbidities: HTN and RA  are also affecting patient's functional outcome.   REHAB POTENTIAL: Excellent  CLINICAL DECISION MAKING: Stable/uncomplicated  EVALUATION COMPLEXITY: Low   GOALS: Goals reviewed with patient? No  SHORT TERM GOALS: Target date: 08/27/2023  Pt will be independent with HEP in order to improve strength and decrease pain in order to improve pain-free function at home and work.  Baseline: NT Goal status: ONGOING  LONG TERM GOALS: Target date: 10/22/2023  Pt will decrease their KOOS score by at least 10 points to show significant increase in knee function.  Baseline: 70%  Goal status: ONGOING  2.  Pt will increase the strength of hip flexion and knee extension by at least 1/3 MMT grade (4- to 4) to demonstrate increase of strength and function.  Baseline: Hip flexion R/L 4-/4-; Knee extension R/L 4*/4 Goal status: ONGOING  3.  Pt will have a negative Ely's test to decrease the tension from the quads on the knee joint and return to activities without knee pain.  Baseline: Positive bilaterally  Goal status: ONGOING   PLAN:  PT FREQUENCY: 1-2x/week  PT DURATION: 10 weeks  PLANNED INTERVENTIONS: 97110-Therapeutic exercises, 97530- Therapeutic activity, [id number]- Neuromuscular re-education, 97535- Self Care, 29562- Manual therapy, [phone]- Gait training, Patient/Family education, Stair training, Taping, Joint mobilization, Joint manipulation, DME instructions, Cryotherapy, Moist heat, and Biofeedback  PLAN FOR NEXT SESSION:   Warm-up on TM to see pain response. TKE and single leg stance.    Ellin Goodie PT, DPT  [facility] [address] [place], [address] Phone: [phone]   Fax:  [phone]

## 2023-09-09 ENCOUNTER — Ambulatory Visit: Admitting: Emergency Medicine

## 2023-09-09 VITALS — Ht 68.0 in | Wt 200.0 lb

## 2023-09-09 DIAGNOSIS — Z0000 Encounter for general adult medical examination without abnormal findings: Secondary | ICD-10-CM | POA: Diagnosis not present

## 2023-09-09 NOTE — Progress Notes (Signed)
 Subjective:   Ralph Doyle is a 73 y.o. who presents for a Medicare Wellness preventive visit.  Visit Complete: Virtual I connected with  Casimer Leek on 09/09/23 by a audio enabled telemedicine application and verified that I am speaking with the correct person using two identifiers.  Patient Location: Home  Provider Location: Home Office  I discussed the limitations of evaluation and management by telemedicine. The patient expressed understanding and agreed to proceed.  Vital Signs: Because this visit was a virtual/telehealth visit, some criteria may be missing or patient reported. Any vitals not documented were not able to be obtained and vitals that have been documented are patient reported.  VideoDeclined- This patient declined Librarian, academic. Therefore the visit was completed with audio only.  AWV Questionnaire: No: Patient Medicare AWV questionnaire was not completed prior to this visit.  Cardiac Risk Factors include: advanced age (>66men, >80 women);male gender;hypertension;dyslipidemia;obesity (BMI >30kg/m2)     Objective:    Today's Vitals   09/09/23 1046  Weight: 200 lb (90.7 kg)  Height: 5\' 8"  (1.727 m)   Body mass index is 30.41 kg/m.     09/09/2023   11:04 AM 08/13/2023    6:17 PM 08/13/2023    4:44 PM 04/03/2023   11:05 AM 11/07/2021    9:37 AM 10/08/2021    9:40 AM 05/16/2020    7:14 AM  Advanced Directives  Does Patient Have a Medical Advance Directive? No No No No No No No  Would patient like information on creating a medical advance directive? No - Patient declined Yes (MAU/Ambulatory/Procedural Areas - Information given) Yes (MAU/Ambulatory/Procedural Areas - Information given)  Yes (MAU/Ambulatory/Procedural Areas - Information given) No - Patient declined No - Patient declined    Current Medications (verified) Outpatient Encounter Medications as of 09/09/2023  Medication Sig   amLODipine (NORVASC) 5 MG tablet Take 5  mg by mouth daily.   atorvastatin (LIPITOR) 10 MG tablet Take 10 mg by mouth daily.   augmented betamethasone dipropionate (DIPROLENE-AF) 0.05 % ointment Apply 1 application topically daily as needed for rash.   folic acid (FOLVITE) 1 MG tablet TAKE 1 TABLET BY MOUTH DAILY   hydrochlorothiazide (MICROZIDE) 12.5 MG capsule Take 1 capsule (12.5 mg total) by mouth daily.   levothyroxine (SYNTHROID) 100 MCG tablet Take 100 mcg by mouth daily.   methotrexate (RHEUMATREX) 2.5 MG tablet TAKE 5 TABLETS (12.5 MG TOTAL) BY MOUTH ONCE WEEKLY. CAUTION: CHEMOTHERAPY. PROTECT FROM LIGHT.   OVER THE COUNTER MEDICATION Apply 1 application topically daily as needed (pain). CBD cream   Polyethyl Glycol-Propyl Glycol (SYSTANE ULTRA OP) Place 1 drop into both eyes daily as needed (dry eyes).   Turmeric 400 MG CAPS Take as needed per directions   valsartan (DIOVAN) 40 MG tablet Take 1 tablet (40 mg total) by mouth daily.   bacitracin ointment Apply 1 application topically every 8 (eight) hours. (Patient not taking: Reported on 09/09/2023)   levothyroxine (SYNTHROID) 112 MCG tablet Take 112 mcg by mouth daily. (Patient not taking: Reported on 09/09/2023)   No facility-administered encounter medications on file as of 09/09/2023.    Allergies (verified) Patient has no known allergies.   History: Past Medical History:  Diagnosis Date   Cancer (HCC)    Thyroid   Hyperlipidemia    Hypertension    Rheumatoid arthritis (HCC)    Past Surgical History:  Procedure Laterality Date   COLONOSCOPY WITH PROPOFOL     COLONOSCOPY WITH PROPOFOL N/A 10/08/2021   Procedure:  COLONOSCOPY WITH PROPOFOL;  Surgeon: Midge Minium, MD;  Location: [facility] ENDOSCOPY;  Service: Endoscopy;  Laterality: N/A;   MANDIBLE SURGERY     THYROIDECTOMY Bilateral 05/16/2020   Procedure: TOTAL THYROIDECTOMY;  Surgeon: Geanie Logan, MD;  Location: [facility] ORS;  Service: ENT;  Laterality: Bilateral;   Family History  Problem Relation Age of Onset    Diabetes Mother    Hypertension Mother    Arthritis Mother    Diabetes Father    Hypertension Father    Diabetes Brother    Hypertension Brother    Hypertension Brother    Social History   Socioeconomic History   Marital status: Married    Spouse name: Not on file   Number of children: Not on file   Years of education: Not on file   Highest education level: Associate degree: occupational, Scientist, product/process development, or vocational program  Occupational History   Occupation: retired  Tobacco Use   Smoking status: Former    Current packs/day: 0.00    Average packs/day: 0.8 packs/day for 20.0 years (15.0 ttl pk-yrs)    Types: Cigarettes    Start date: 1962    Quit date: 1982    Years since quitting: 43.2    Passive exposure: Past   Smokeless tobacco: Never  Vaping Use   Vaping status: Never Used  Substance and Sexual Activity   Alcohol use: Yes    Alcohol/week: 14.0 standard drinks of alcohol    Types: 14 Cans of beer per week    Comment: 2 beers per day   Drug use: Not Currently    Frequency: 2.0 times per week    Types: Marijuana    Comment: 2 times per week   Sexual activity: Yes    Birth control/protection: None  Other Topics Concern   Not on file  Social History Narrative   Not on file   Social Drivers of Health   Financial Resource Strain: Low Risk  (09/09/2023)   Overall Financial Resource Strain (CARDIA)    Difficulty of Paying Living Expenses: Not hard at all  Food Insecurity: No Food Insecurity (09/09/2023)   Hunger Vital Sign    Worried About Running Out of Food in the Last Year: Never true    Ran Out of Food in the Last Year: Never true  Transportation Needs: No Transportation Needs (09/09/2023)   PRAPARE - Administrator, Civil Service (Medical): No    Lack of Transportation (Non-Medical): No  Physical Activity: Insufficiently Active (09/09/2023)   Exercise Vital Sign    Days of Exercise per Week: 2 days    Minutes of Exercise per Session: 20 min   Stress: No Stress Concern Present (09/09/2023)   [employer] of Occupational Health - Occupational Stress Questionnaire    Feeling of Stress : Only a little  Social Connections: Socially Integrated (09/09/2023)   Social Connection and Isolation Panel [NHANES]    Frequency of Communication with Friends and Family: More than three times a week    Frequency of Social Gatherings with Friends and Family: Three times a week    Attends Religious Services: More than 4 times per year    Active Member of Clubs or Organizations: Yes    Attends Engineer, structural: More than 4 times per year    Marital Status: Married    Tobacco Counseling Counseling given: Not Answered    Clinical Intake:  Pre-visit preparation completed: Yes  Pain : No/denies pain     BMI - recorded:  30.41 Nutritional Status: BMI > 30  Obese Nutritional Risks: None Diabetes: No  How often do you need to have someone help you when you read instructions, pamphlets, or other written materials from your doctor or pharmacy?: 1 - Never  Interpreter Needed?: No  Information entered by :: Tora Kindred, CMA   Activities of Daily Living     09/09/2023   10:48 AM  In your present state of health, do you have any difficulty performing the following activities:  Hearing? 0  Vision? 0  Difficulty concentrating or making decisions? 0  Walking or climbing stairs? 0  Dressing or bathing? 0  Doing errands, shopping? 0  Preparing Food and eating ? N  Using the Toilet? N  In the past six months, have you accidently leaked urine? N  Do you have problems with loss of bowel control? N  Managing your Medications? N  Managing your Finances? N  Housekeeping or managing your Housekeeping? N    Patient Care Team: Debera Lat, PA-C as PCP - General (Physician Assistant)  Indicate any recent Medical Services you may have received from other than [facility] providers in the past year (date may be approximate).      Assessment:   This is a routine wellness examination for [place].  Hearing/Vision screen Hearing Screening - Comments:: Denies hearing loss Vision Screening - Comments:: Needs eye exam (2 yrs ago) [facility] [place]   Goals Addressed             This Visit's Progress    Patient Stated       Lose 3-5 lbs       Depression Screen     09/09/2023   11:00 AM 08/18/2023    9:05 AM 05/18/2023    9:26 AM 05/04/2023    1:12 PM  PHQ 2/9 Scores  PHQ - 2 Score 1 4 1 1   PHQ- 9 Score 1 10 2 2     Fall Risk     09/09/2023   11:06 AM 08/18/2023    9:05 AM 05/18/2023    9:25 AM  Fall Risk   Falls in the past year? 1 0 1  Number falls in past yr: 0 0 0  Injury with Fall? 0 0 0  Risk for fall due to : History of fall(s);Impaired balance/gait;Orthopedic patient No Fall Risks   Follow up Falls prevention discussed;Falls evaluation completed;Education provided Falls prevention discussed;Falls evaluation completed     MEDICARE RISK AT HOME:  Medicare Risk at Home Any stairs in or around the home?: Yes If so, are there any without handrails?: No Home free of loose throw rugs in walkways, pet beds, electrical cords, etc?: Yes Adequate lighting in your home to reduce risk of falls?: Yes Life alert?: No Use of a cane, walker or w/c?: No Grab bars in the bathroom?: Yes Shower chair or bench in shower?: No Elevated toilet seat or a handicapped toilet?: No  TIMED UP AND GO:  Was the test performed?  No  Cognitive Function: 6CIT completed        09/09/2023   11:08 AM  6CIT Screen  What Year? 0 points  What month? 0 points  What time? 3 points  Count back from 20 0 points  Months in reverse 0 points  Repeat phrase 0 points  Total Score 3 points    Immunizations Immunization History  Administered Date(s) Administered   PFIZER(Purple Top)SARS-COV-2 Vaccination 08/25/2019, 09/20/2019   Tdap 04/03/2023    Screening Tests  Health Maintenance  Topic Date Due   Zoster  Vaccines- Shingrix (1 of 2) Never done   Pneumonia Vaccine 5+ Years old (1 of 1 - PCV) Never done   COVID-19 Vaccine (3 - Pfizer risk series) 10/18/2019   INFLUENZA VACCINE  09/28/2023 (Originally 01/29/2023)   Medicare Annual Wellness (AWV)  09/08/2024   Colonoscopy  10/08/2028   DTaP/Tdap/Td (2 - Td or Tdap) 04/02/2033   Hepatitis C Screening  Completed   HPV VACCINES  Aged Out    Health Maintenance  Health Maintenance Due  Topic Date Due   Zoster Vaccines- Shingrix (1 of 2) Never done   Pneumonia Vaccine 39+ Years old (1 of 1 - PCV) Never done   COVID-19 Vaccine (3 - Pfizer risk series) 10/18/2019   Health Maintenance Items Addressed: See Nurse Notes  Additional Screening:  Vision Screening: Recommended annual ophthalmology exams for early detection of glaucoma and other disorders of the eye.  Dental Screening: Recommended annual dental exams for proper oral hygiene  Community Resource Referral / Chronic Care Management: CRR required this visit?  No   CCM required this visit?  No     Plan:     I have personally reviewed and noted the following in the patient's chart:   Medical and social history Use of alcohol, tobacco or illicit drugs  Current medications and supplements including opioid prescriptions. Patient is not currently taking opioid prescriptions. Functional ability and status Nutritional status Physical activity Advanced directives List of other physicians Hospitalizations, surgeries, and ER visits in previous 12 months Vitals Screenings to include cognitive, depression, and falls Referrals and appointments  In addition, I have reviewed and discussed with patient certain preventive protocols, quality metrics, and best practice recommendations. A written personalized care plan for preventive services as well as general preventive health recommendations were provided to patient.     Tora Kindred, CMA   09/09/2023   After Visit Summary: (MyChart)  Due to this being a telephonic visit, the after visit summary with patients personalized plan was offered to patient via MyChart   Notes:  6 CIT score - 3 Needs eye exam, patient to call and schedule Declined pneumonia and covid vaccines Unsure about getting the shingles vaccines

## 2023-09-09 NOTE — Patient Instructions (Addendum)
 Ralph Doyle , Thank you for taking time to come for your Medicare Wellness Visit. I appreciate your ongoing commitment to your health goals. Please review the following plan we discussed and let me know if I can assist you in the future.   Referrals/Orders/Follow-Ups/Clinician Recommendations: Call and schedule an eye exam at your earliest convenience. [facility] phone # is [phone]. If you decide to get the shingles vaccines, you may get them at your local pharmacy.  This is a list of the screening recommended for you and due dates:  Health Maintenance  Topic Date Due   Zoster (Shingles) Vaccine (1 of 2) Never done   Pneumonia Vaccine (1 of 1 - PCV) Never done   COVID-19 Vaccine (3 - Pfizer risk series) 10/18/2019   Flu Shot  09/28/2023*   Medicare Annual Wellness Visit  09/08/2024   Colon Cancer Screening  10/08/2028   DTaP/Tdap/Td vaccine (2 - Td or Tdap) 04/02/2033   Hepatitis C Screening  Completed   HPV Vaccine  Aged Out  *Topic was postponed. The date shown is not the original due date.    Advanced directives: (Copy Requested) Please bring a copy of your health care power of attorney and living will to the office to be added to your chart at your convenience. You can mail to [facility] [address] [place], [place] [address] or email to [email or web address]  Next Medicare Annual Wellness Visit scheduled for next year: Yes, 09/14/24 @ 9:30am (phone visit)  Fall Prevention in the Home, Adult Falls can cause injuries and affect people of all ages. There are many simple things that you can do to make your home safe and to help prevent falls. If you need it, ask for help making these changes. What actions can I take to prevent falls? General information Use good lighting in all rooms. Make sure to: Replace any light bulbs that burn out. Turn on lights if it is dark and use night-lights. Keep items that you use often in easy-to-reach places. Lower the  shelves around your home if needed. Move furniture so that there are clear paths around it. Do not keep throw rugs or other things on the floor that can make you trip. If any of your floors are uneven, fix them. Add color or contrast paint or tape to clearly mark and help you see: Grab bars or handrails. First and last steps of staircases. Where the edge of each step is. If you use a ladder or stepladder: Make sure that it is fully opened. Do not climb a closed ladder. Make sure the sides of the ladder are locked in place. Have someone hold the ladder while you use it. Know where your pets are as you move through your home. What can I do in the bathroom?     Keep the floor dry. Clean up any water that is on the floor right away. Remove soap buildup in the bathtub or shower. Buildup makes bathtubs and showers slippery. Use non-skid mats or decals on the floor of the bathtub or shower. Attach bath mats securely with double-sided, non-slip rug tape. If you need to sit down while you are in the shower, use a non-slip stool. Install grab bars by the toilet and in the bathtub and shower. Do not use towel bars as grab bars. What can I do in the bedroom? Make sure that you have a light by your bed that is easy to reach. Do not use  any sheets or blankets on your bed that hang to the floor. Have a firm bench or chair with side arms that you can use for support when you get dressed. What can I do in the kitchen? Clean up any spills right away. If you need to reach something above you, use a sturdy step stool that has a grab bar. Keep electrical cables out of the way. Do not use floor polish or wax that makes floors slippery. What can I do with my stairs? Do not leave anything on the stairs. Make sure that you have a light switch at the top and the bottom of the stairs. Have them installed if you do not have them. Make sure that there are handrails on both sides of the stairs. Fix handrails  that are broken or loose. Make sure that handrails are as long as the staircases. Install non-slip stair treads on all stairs in your home if they do not have carpet. Avoid having throw rugs at the top or bottom of stairs, or secure the rugs with carpet tape to prevent them from moving. Choose a carpet design that does not hide the edge of steps on the stairs. Make sure that carpet is firmly attached to the stairs. Fix any carpet that is loose or worn. What can I do on the outside of my home? Use bright outdoor lighting. Repair the edges of walkways and driveways and fix any cracks. Clear paths of anything that can make you trip, such as tools or rocks. Add color or contrast paint or tape to clearly mark and help you see high doorway thresholds. Trim any bushes or trees on the main path into your home. Check that handrails are securely fastened and in good repair. Both sides of all steps should have handrails. Install guardrails along the edges of any raised decks or porches. Have leaves, snow, and ice cleared regularly. Use sand, salt, or ice melt on walkways during winter months if you live where there is ice and snow. In the garage, clean up any spills right away, including grease or oil spills. What other actions can I take? Review your medicines with your health care provider. Some medicines can make you confused or feel dizzy. This can increase your chance of falling. Wear closed-toe shoes that fit well and support your feet. Wear shoes that have rubber soles and low heels. Use a cane, walker, scooter, or crutches that help you move around if needed. Talk with your provider about other ways that you can decrease your risk of falls. This may include seeing a physical therapist to learn to do exercises to improve movement and strength. Where to find more information Centers for Disease Control and Prevention, STEADI: TonerPromos.no [employer] on Aging: BaseRingTones.pl National Institute on  Aging: BaseRingTones.pl Contact a health care provider if: You are afraid of falling at home. You feel weak, drowsy, or dizzy at home. You fall at home. Get help right away if you: Lose consciousness or have trouble moving after a fall. Have a fall that causes a head injury. These symptoms may be an emergency. Get help right away. Call 911. Do not wait to see if the symptoms will go away. Do not drive yourself to the hospital. This information is not intended to replace advice given to you by your health care provider. Make sure you discuss any questions you have with your health care provider. Document Revised: 02/17/2022 Document Reviewed: 02/17/2022 Elsevier Patient Education  2024 [employer].

## 2023-09-10 ENCOUNTER — Ambulatory Visit: Payer: Medicare Other | Admitting: Physical Therapy

## 2023-09-10 DIAGNOSIS — M25561 Pain in right knee: Secondary | ICD-10-CM | POA: Diagnosis not present

## 2023-09-10 DIAGNOSIS — G8929 Other chronic pain: Secondary | ICD-10-CM

## 2023-09-10 NOTE — Therapy (Signed)
 OUTPATIENT PHYSICAL THERAPY LOWER EXTREMITY TREATMENT   Patient Name: Ralph Doyle MRN: [id number] DOB:October 26, 1950, 73 y.o., male Today's Date: 09/10/2023  END OF SESSION:  PT End of Session - 09/10/23 1603     Visit Number 5    Number of Visits 20    Date for PT Re-Evaluation 10/22/23    Authorization Type Medicare 2025    Authorization - Visit Number 5    Authorization - Number of Visits 20    Progress Note Due on Visit 10    PT Start Time 1603    PT Stop Time 1645    PT Time Calculation (min) 42 min    Activity Tolerance Patient tolerated treatment well    Behavior During Therapy WFL for tasks assessed/performed             Past Medical History:  Diagnosis Date   Cancer (HCC)    Thyroid   Hyperlipidemia    Hypertension    Rheumatoid arthritis (HCC)    Past Surgical History:  Procedure Laterality Date   COLONOSCOPY WITH PROPOFOL     COLONOSCOPY WITH PROPOFOL N/A 10/08/2021   Procedure: COLONOSCOPY WITH PROPOFOL;  Surgeon: Midge Minium, MD;  Location: [facility] ENDOSCOPY;  Service: Endoscopy;  Laterality: N/A;   MANDIBLE SURGERY     THYROIDECTOMY Bilateral 05/16/2020   Procedure: TOTAL THYROIDECTOMY;  Surgeon: Geanie Logan, MD;  Location: [facility] ORS;  Service: ENT;  Laterality: Bilateral;   Patient Active Problem List   Diagnosis Date Noted   Obesity (BMI 30-39.9) 08/18/2023   Dry eye syndrome of both eyes 08/18/2023   Rheumatoid arthritis (HCC) 08/18/2023   Primary hypertension 05/05/2023   Other hyperlipidemia 05/05/2023   Radial styloid tenosynovitis (de quervain) 11/04/2022   Dupuytren's contracture of both hands 05/07/2022   Neck pain 10/30/2021   Positive colorectal cancer screening using Cologuard test    Polyp of transverse colon    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   Pain in right hand 10/12/2020   Pain of left hand 10/12/2020   Flexor tenosynovitis of finger 08/15/2020   Rheumatoid arthritis with rheumatoid factor of multiple sites without  organ or systems involvement (HCC) 06/14/2020   High risk medication use 06/14/2020   Thyroid malignant neoplasm (HCC) 05/16/2020    PCP: Debera Lat, PA-C  REFERRING PROVIDER: Fuller Plan, MD  REFERRING DIAG: M25.561,M25.562,G89.29 (ICD-10-CM) - Chronic pain of both knees  THERAPY DIAG:  Chronic pain of right knee  Chronic pain of left knee  Rationale for Evaluation and Treatment: Rehabilitation  ONSET DATE: 4 years   SUBJECTIVE:   SUBJECTIVE STATEMENT: Pt reports ongoing knee bilateral knee pain behind the kneecap. He also is feeling a dull hip pain on lateral side of left hip when he is resting and moving and it has been happening with more frequency.   PERTINENT HISTORY: Pt has been having bilateral knee pain (R>L) while walking over the past 3-4 years. He says the pain comes on when he takes a step incorrectly or when he ascends or descends stairs. Pt reports no numbness or tingling and the pain doesn't travel above or below the knee but he has had some lateral hip pain. Pt got x-rays and the doctor said surgery wasn't warranted and PT could help. Pt said it isn't constantly hurting, it comes and goes depending on how he is walking. He has not tried any pain medication and the pain goes away on its own. He reports that he is not limited functionally. He likes to  play golf and go on walks and has only noticed some pain with these activities, but not enough to stop.   PAIN:  Are you having pain? Yes: NPRS scale: 5/10  Pain location: Left anterior knee cap  Pain description: sharp pain comes on then goes away (R>L), achey  Aggravating factors: walking, stairs  Relieving factors: rest , icy hot  PRECAUTIONS: None  RED FLAGS: None   WEIGHT BEARING RESTRICTIONS: No  FALLS:  Has patient fallen in last 6 months? Yes. Number of falls 1, off a ladder but did not injure himself   LIVING ENVIRONMENT: Lives with: lives with their family and lives with their  spouse Lives in: House/apartment Stairs: Yes: Internal: 12 steps; on right going up Has following equipment at home: None  OCCUPATION: Retired  PLOF: Independent  PATIENT GOALS: to be stronger and have less pain  NEXT MD VISIT: 3 months  OBJECTIVE:  Note: Objective measures were completed at Evaluation unless otherwise noted.  Vitals: BP: 141/86 HR: 60 SpO2: 99  DIAGNOSTIC FINDINGS:  X-ray left knee 3 views   Medial and lateral compartment joint space are preserved possibly small  marginal and posterior osteophyte.  Mild patellofemoral joint space  narrowing with subchondral cystic changes and lateral osteophyte.  No  visible effusion or abnormal calcification.   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  Probable patellofemoral joint space narrowing and mild  lateral deviation of patella.  Small superior patellar enthesophyte.  No  visible effusion or abnormal calcification.   Impression   Mild tricompartmental osteoarthritis, superior patellar enthesophyte may  be consistent with patellofemoral arthritis or chronic quadriceps  Tendinopathy  PATIENT SURVEYS:  KOOS:  70/100 (change from eval)  COGNITION: Overall cognitive status: Within functional limits for tasks assessed     SENSATION: WFL  MUSCLE LENGTH: Hamstrings: Right NT deg; Left NT deg  POSTURE: rounded shoulders, forward head, and flexed trunk   PALPATION: No tenderness  LOWER EXTREMITY ROM:  Active ROM Right eval Left eval  Hip flexion    Hip extension    Hip abduction    Hip adduction    Hip internal rotation    Hip external rotation    Knee flexion 130/130 130/130  Knee extension 0/0 0/0  Ankle dorsiflexion    Ankle  plantarflexion    Ankle inversion    Ankle eversion     (Blank rows = not tested)  LOWER EXTREMITY MMT:  MMT Right eval Left eval  Hip flexion 4- 4-  Hip extension 4+ 4+  Hip abduction 4+ 4+  Hip adduction 4+ 4+  Hip internal rotation 5 5  Hip external rotation 5 5  Knee flexion 5 5  Knee extension 4* 4  Ankle dorsiflexion    Ankle plantarflexion    Ankle inversion    Ankle eversion     (Blank rows = not tested)  LOWER EXTREMITY SPECIAL TESTS:  Hip special tests: Ely's test: positive  Knee special tests: Step up/down test: negative and Valgus/varus test: negative   FUNCTIONAL TESTS:  6 minute walk test: 1950 ft; pt reported no increase in knee pain  GAIT: Distance walked: 1950 ft Assistive device utilized: None Level of assistance: Complete Independence Comments: normal  TREATMENT DATE:   09/10/23:  THEREX  Nu-Step with seat and arms at 11 with resistance at 4 for 5 min  Prone Quad Stretch 2 x 60 sec   PHYSICAL PERFORMANCE  Ely's Test: Positive Bilateral  Knee and Hip MMT: -Hip Flex R/L 4+/4+ -Knee Ext R/L 4+/4+ -Knee Flex R/L 4+/4+   KOOS Score 83% -Symptoms + Stiffness: 57%   -Pain 72% -Functional and Daily Living: 85%   09/03/23: THEREX  Nu-Step with seat at 9 with resistance at 2 for 6 min  Mini-Squat to 90 deg hip and knee flexion 1 x 10  -min VC to maintain knees behind toes  Mini-Squat to 60 deg knee flexion 1 x 10  Mini-Squat to 45 deg knee flexion 1 x 10  -26 inch height mat surface  -min VC to decrease speed down and come up quick  Reverse Lunges with BUE support  3 x 10  -min VC  Heel Raises 1 x 10  Heel Raises off 4 inch step 1 x 10  Heel Raises off 4 inch step with #10 x 2 DB 1 x 10   Pt educated on need to increase walking for improved cardiovascular health with introduction of walking program.  08/26/23 TherEx  Nu step at  seat 9 with resistance 3 for 6 minutes  Sit to stand to mat 1x10  Sit to stand to mat with water gallon 2x10  Modified thomas stretch 1x60 sec  Forward step ups 1x10 each leg  Backwards step downs 1x10 each leg  -pt reports increased left knee crepitus when stepping down with RLE  Leg Extensions #45 3x10   -pt instructed to stop before 0 extension when pain starts   -pt reports increased painless left knee crepitus OMEGA leg press #45 1x10  OMEGA leg press #65 1x10  OMEGA leg press #85 1x10   OMEGA leg press #105 1x20 Sit to stand with #15 DB 1x10  Sit to stand with #25 plate [id number]   PATIENT EDUCATION:  Education details: Form and technique for correct performance of exercise and explanation about exam findings  Person educated: Patient Education method: Explanation, Verbal cues, and Handouts Education comprehension: verbalized understanding and returned demonstration  HOME EXERCISE PROGRAM: Access Code: [email or web address] URL: [email or web address] Date: 09/03/2023 Prepared by: Ellin Goodie  Exercises - Modified Maisie Fus Stretch  - 1 x daily - 7 x weekly - 3 sets - 60sec hold - Mini Squat  - 3-4 x weekly - 3 sets - 10 reps - Reverse Lunge  - 3-4 x weekly - 3 sets - 10 reps - Standing Bilateral Heel Raise on Step  - 3-4 x weekly - 3 sets - 10 reps  ASSESSMENT:  CLINICAL IMPRESSION: Pt demonstrates ongoing progress towards goals with improvement in knee strength and perception of knee function. He is still limited by hip flexor tightness as evidenced by positive Ely's test. Despite improvement in knee function and strength, pt continues to be limited by pain. PT to decrease frequency to 1x per week and to focus on addressing pain through bracing that reduces patella movement.  Pt will benefit from further PT to strengthen both LE to be able to walk for longer distances and go up and down stairs without an increase of bilateral knee pain.    OBJECTIVE IMPAIRMENTS:  decreased activity tolerance, difficulty walking, decreased strength, impaired flexibility, and pain.   ACTIVITY LIMITATIONS: bending, sitting, standing, squatting, stairs, and bed mobility, walking  PARTICIPATION LIMITATIONS:  N/A  PERSONAL FACTORS: Age,  Past/current experiences, Time since onset of injury/illness/exacerbation, and 1-2 comorbidities: HTN and RA  are also affecting patient's functional outcome.   REHAB POTENTIAL: Excellent  CLINICAL DECISION MAKING: Stable/uncomplicated  EVALUATION COMPLEXITY: Low   GOALS: Goals reviewed with patient? No  SHORT TERM GOALS: Target date: 08/27/2023  Pt will be independent with HEP in order to improve strength and decrease pain in order to improve pain-free function at home and work.  Baseline: NT Goal status: ACHIEVED   LONG TERM GOALS: Target date: 10/22/2023  Pt will decrease their KOOS score by at least 10 points to show significant increase in knee function.  Baseline: 70% 09/10/23: 83% Goal status: ONGOING  2.  Pt will increase the strength of hip flexion and knee extension by at least 1/3 MMT grade (4- to 4) to demonstrate increase of strength and function.  Baseline: Hip flexion R/L 4-/4-; Knee extension R/L 4*/4  09/10/23: Hip Flex R/L 4+/4+, Knee Ext R/L 4+/4+ Goal status: ACHIEVED   3.  Pt will have a negative Ely's test to decrease the tension from the quads on the knee joint and return to activities without knee pain.  Baseline: Positive bilaterally 09/10/23: Positive Bilateral  Goal status: ONGOING   PLAN:  PT FREQUENCY: 1-2x/week  PT DURATION: 10 weeks  PLANNED INTERVENTIONS: 97110-Therapeutic exercises, 97530- Therapeutic activity, 97112- Neuromuscular re-education, 97535- Self Care, [address]- Manual therapy, [phone]- Gait training, Patient/Family education, Stair training, Taping, Joint mobilization, Joint manipulation, DME instructions, Cryotherapy, Moist heat, and Biofeedback  PLAN FOR NEXT SESSION:  Review  bracing and trial McConnell taping for lateral patellar tracking. Warm-up on TM to see pain response. TKE and single leg stance and maybe look 1 RM for leg press or knee extension. Re-examine lateral hip pain on left side and determine potential pathology.    Ellin Goodie PT, DPT  [facility] [address], [place], [address] Phone: [phone]   Fax:  [phone]

## 2023-09-13 NOTE — Progress Notes (Unsigned)
 Established patient visit  Patient: Ralph Doyle   DOB: 1951-02-12   73 y.o. Male  MRN: [id number] Visit Date: 09/14/2023  Today's healthcare provider: Debera Lat, PA-C   No chief complaint on file.  Subjective       Discussed the use of AI scribe software for clinical note transcription with the patient, who gave verbal consent to proceed.  History of Present Illness               09/09/2023   11:00 AM 08/18/2023    9:05 AM 05/18/2023    9:26 AM  Depression screen PHQ 2/9  Decreased Interest 0 2 0  Down, Depressed, Hopeless 1 2 1   PHQ - 2 Score 1 4 1   Altered sleeping 0 1 1  Tired, decreased energy 0 1 0  Change in appetite 0 1 0  Feeling bad or failure about yourself  0 1 0  Trouble concentrating 0 1 0  Moving slowly or fidgety/restless 0 1 0  Suicidal thoughts 0 0 0  PHQ-9 Score 1 10 2   Difficult doing work/chores Not difficult at all Somewhat difficult Somewhat difficult      08/18/2023    9:06 AM 05/18/2023    9:26 AM  GAD 7 : Generalized Anxiety Score  Nervous, Anxious, on Edge 0 0  Control/stop worrying 0 0  Worry too much - different things 1 1  Trouble relaxing 1 1  Restless 1 0  Easily annoyed or irritable 0 0  Afraid - awful might happen 1 1  Total GAD 7 Score 4 3  Anxiety Difficulty Somewhat difficult Somewhat difficult    Medications: Outpatient Medications Prior to Visit  Medication Sig   amLODipine (NORVASC) 5 MG tablet Take 5 mg by mouth daily.   atorvastatin (LIPITOR) 10 MG tablet Take 10 mg by mouth daily.   augmented betamethasone dipropionate (DIPROLENE-AF) 0.05 % ointment Apply 1 application topically daily as needed for rash.   bacitracin ointment Apply 1 application topically every 8 (eight) hours. (Patient not taking: Reported on 09/09/2023)   folic acid (FOLVITE) 1 MG tablet TAKE 1 TABLET BY MOUTH DAILY   hydrochlorothiazide (MICROZIDE) 12.5 MG capsule Take 1 capsule (12.5 mg total) by mouth daily.   levothyroxine (SYNTHROID)  100 MCG tablet Take 100 mcg by mouth daily.   levothyroxine (SYNTHROID) 112 MCG tablet Take 112 mcg by mouth daily. (Patient not taking: Reported on 09/09/2023)   methotrexate (RHEUMATREX) 2.5 MG tablet TAKE 5 TABLETS (12.5 MG TOTAL) BY MOUTH ONCE WEEKLY. CAUTION: CHEMOTHERAPY. PROTECT FROM LIGHT.   OVER THE COUNTER MEDICATION Apply 1 application topically daily as needed (pain). CBD cream   Polyethyl Glycol-Propyl Glycol (SYSTANE ULTRA OP) Place 1 drop into both eyes daily as needed (dry eyes).   Turmeric 400 MG CAPS Take as needed per directions   valsartan (DIOVAN) 40 MG tablet Take 1 tablet (40 mg total) by mouth daily.   No facility-administered medications prior to visit.    Review of Systems  All other systems reviewed and are negative.  All negative Except see HPI   {Insert previous labs (optional):23779} {See past labs  Heme  Chem  Endocrine  Serology  Results Review (optional):1}   Objective    There were no vitals taken for this visit. {Insert last BP/Wt (optional):23777}{See vitals history (optional):1}   Physical Exam Vitals reviewed.  Constitutional:      General: He is not in acute distress.    Appearance: Normal appearance. He is not diaphoretic.  HENT:     Head: Normocephalic and atraumatic.  Eyes:     General: No scleral icterus.    Conjunctiva/sclera: Conjunctivae normal.  Cardiovascular:     Rate and Rhythm: Normal rate and regular rhythm.     Pulses: Normal pulses.     Heart sounds: Normal heart sounds. No murmur heard. Pulmonary:     Effort: Pulmonary effort is normal. No respiratory distress.     Breath sounds: Normal breath sounds. No wheezing or rhonchi.  Musculoskeletal:     Cervical back: Neck supple.     Right lower leg: No edema.     Left lower leg: No edema.  Lymphadenopathy:     Cervical: No cervical adenopathy.  Skin:    General: Skin is warm and dry.     Findings: No rash.  Neurological:     Mental Status: He is alert and  oriented to person, place, and time. Mental status is at baseline.  Psychiatric:        Mood and Affect: Mood normal.        Behavior: Behavior normal.      No results found for any visits on 09/14/23.      Assessment and Plan             No orders of the defined types were placed in this encounter.   No follow-ups on file.   The patient was advised to call back or seek an in-person evaluation if the symptoms worsen or if the condition fails to improve as anticipated.  I discussed the assessment and treatment plan with the patient. The patient was provided an opportunity to ask questions and all were answered. The patient agreed with the plan and demonstrated an understanding of the instructions.  I, Debera Lat, PA-C have reviewed all documentation for this visit. The documentation on 09/14/2023  for the exam, diagnosis, procedures, and orders are all accurate and complete.  Debera Lat, [facility], MMS [facility] [phone] (phone) [phone] (fax)  [facility]

## 2023-09-14 ENCOUNTER — Encounter: Payer: Self-pay | Admitting: Physician Assistant

## 2023-09-14 ENCOUNTER — Ambulatory Visit: Payer: Medicare Other | Admitting: Physician Assistant

## 2023-09-14 VITALS — BP 145/90 | HR 75 | Temp 97.2°F | Resp 16 | Ht 68.0 in | Wt 204.5 lb

## 2023-09-14 DIAGNOSIS — R600 Localized edema: Secondary | ICD-10-CM

## 2023-09-14 DIAGNOSIS — J069 Acute upper respiratory infection, unspecified: Secondary | ICD-10-CM

## 2023-09-14 DIAGNOSIS — R4589 Other symptoms and signs involving emotional state: Secondary | ICD-10-CM

## 2023-09-14 DIAGNOSIS — H04123 Dry eye syndrome of bilateral lacrimal glands: Secondary | ICD-10-CM

## 2023-09-14 DIAGNOSIS — E669 Obesity, unspecified: Secondary | ICD-10-CM

## 2023-09-14 DIAGNOSIS — I10 Essential (primary) hypertension: Secondary | ICD-10-CM

## 2023-09-14 DIAGNOSIS — M069 Rheumatoid arthritis, unspecified: Secondary | ICD-10-CM

## 2023-09-14 LAB — POCT INFLUENZA A/B
Influenza A, POC: NEGATIVE
Influenza B, POC: NEGATIVE

## 2023-09-15 ENCOUNTER — Ambulatory Visit: Payer: Medicare Other | Admitting: Physical Therapy

## 2023-09-16 ENCOUNTER — Encounter: Payer: Medicare Other | Admitting: Physical Therapy

## 2023-09-17 ENCOUNTER — Ambulatory Visit: Payer: Medicare Other | Admitting: Physical Therapy

## 2023-09-23 ENCOUNTER — Ambulatory Visit: Payer: Medicare Other | Admitting: Physical Therapy

## 2023-09-23 DIAGNOSIS — G8929 Other chronic pain: Secondary | ICD-10-CM

## 2023-09-23 DIAGNOSIS — M25561 Pain in right knee: Secondary | ICD-10-CM | POA: Diagnosis not present

## 2023-09-23 NOTE — Therapy (Addendum)
 OUTPATIENT PHYSICAL THERAPY LOWER EXTREMITY DISCHARGE   Patient Name: Ralph Doyle MRN: [id number] DOB:August 06, 1950, 73 y.o., male Today's Date: 09/23/2023  END OF SESSION:  PT End of Session - 09/23/23 1820     Visit Number 6    Number of Visits 20    Date for PT Re-Evaluation 10/22/23    Authorization Type Medicare 2025    Authorization - Visit Number 6    Authorization - Number of Visits 20    Progress Note Due on Visit 10    Activity Tolerance Patient tolerated treatment well    Behavior During Therapy [facility] for tasks assessed/performed              Past Medical History:  Diagnosis Date   Cancer (HCC)    Thyroid   Hyperlipidemia    Hypertension    Rheumatoid arthritis (HCC)    Past Surgical History:  Procedure Laterality Date   COLONOSCOPY WITH PROPOFOL     COLONOSCOPY WITH PROPOFOL N/A 10/08/2021   Procedure: COLONOSCOPY WITH PROPOFOL;  Surgeon: Midge Minium, MD;  Location: [facility] ENDOSCOPY;  Service: Endoscopy;  Laterality: N/A;   MANDIBLE SURGERY     THYROIDECTOMY Bilateral 05/16/2020   Procedure: TOTAL THYROIDECTOMY;  Surgeon: Geanie Logan, MD;  Location: [facility] ORS;  Service: ENT;  Laterality: Bilateral;   Patient Active Problem List   Diagnosis Date Noted   Obesity (BMI 30-39.9) 08/18/2023   Dry eye syndrome of both eyes 08/18/2023   Rheumatoid arthritis (HCC) 08/18/2023   Primary hypertension 05/05/2023   Other hyperlipidemia 05/05/2023   Radial styloid tenosynovitis (de quervain) 11/04/2022   Dupuytren's contracture of both hands 05/07/2022   Neck pain 10/30/2021   Positive colorectal cancer screening using Cologuard test    Polyp of transverse colon    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   Pain in right hand 10/12/2020   Pain of left hand 10/12/2020   Flexor tenosynovitis of finger 08/15/2020   Rheumatoid arthritis with rheumatoid factor of multiple sites without organ or systems involvement (HCC) 06/14/2020   High risk medication use  06/14/2020   Thyroid malignant neoplasm (HCC) 05/16/2020    PCP: Debera Lat, PA-C  REFERRING PROVIDER: Fuller Plan, MD  REFERRING DIAG: M25.561,M25.562,G89.29 (ICD-10-CM) - Chronic pain of both knees  THERAPY DIAG:  Chronic pain of right knee  Chronic pain of left knee  Rationale for Evaluation and Treatment: Rehabilitation  ONSET DATE: 4 years   SUBJECTIVE:   SUBJECTIVE STATEMENT: Pt reports that he is just getting over the flu, so he was able to perform exercises. He has brought in knee sleeves today to see if this helps with the knee pain   PERTINENT HISTORY: Pt has been having bilateral knee pain (R>L) while walking over the past 3-4 years. He says the pain comes on when he takes a step incorrectly or when he ascends or descends stairs. Pt reports no numbness or tingling and the pain doesn't travel above or below the knee but he has had some lateral hip pain. Pt got x-rays and the doctor said surgery wasn't warranted and PT could help. Pt said it isn't constantly hurting, it comes and goes depending on how he is walking. He has not tried any pain medication and the pain goes away on its own. He reports that he is not limited functionally. He likes to play golf and go on walks and has only noticed some pain with these activities, but not enough to stop.   PAIN:  Are you having  pain? Yes: NPRS scale: 2-3/10  Pain location: Left anterior knee cap  Pain description: sharp pain comes on then goes away (R>L), achey  Aggravating factors: walking, stairs  Relieving factors: rest , icy hot  PRECAUTIONS: None  RED FLAGS: None   WEIGHT BEARING RESTRICTIONS: No  FALLS:  Has patient fallen in last 6 months? Yes. Number of falls 1, off a ladder but did not injure himself   LIVING ENVIRONMENT: Lives with: lives with their family and lives with their spouse Lives in: House/apartment Stairs: Yes: Internal: 12 steps; on right going up Has following equipment at home:  None  OCCUPATION: Retired  PLOF: Independent  PATIENT GOALS: to be stronger and have less pain  NEXT MD VISIT: 3 months  OBJECTIVE:  Note: Objective measures were completed at Evaluation unless otherwise noted.  Vitals: BP: 141/86 HR: 60 SpO2: 99  DIAGNOSTIC FINDINGS:  X-ray left knee 3 views   Medial and lateral compartment joint space are preserved possibly small  marginal and posterior osteophyte.  Mild patellofemoral joint space  narrowing with subchondral cystic changes and lateral osteophyte.  No  visible effusion or abnormal calcification.   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  Probable patellofemoral joint space narrowing and mild  lateral deviation of patella.  Small superior patellar enthesophyte.  No  visible effusion or abnormal calcification.   Impression   Mild tricompartmental osteoarthritis, superior patellar enthesophyte may  be consistent with patellofemoral arthritis or chronic quadriceps  Tendinopathy  PATIENT SURVEYS:  KOOS:  70/100 (change from eval)  COGNITION: Overall cognitive status: Within functional limits for tasks assessed     SENSATION: WFL  MUSCLE LENGTH: Hamstrings: Right NT deg; Left NT deg  POSTURE: rounded shoulders, forward head, and flexed trunk   PALPATION: No tenderness  LOWER EXTREMITY ROM:  Active ROM Right eval Left eval  Hip flexion    Hip extension    Hip abduction    Hip adduction    Hip internal rotation    Hip external rotation    Knee flexion 130/130 130/130  Knee extension 0/0 0/0  Ankle dorsiflexion    Ankle plantarflexion    Ankle inversion    Ankle eversion     (Blank rows = not tested)  LOWER EXTREMITY MMT:  MMT Right eval  Left eval  Hip flexion 4- 4-  Hip extension 4+ 4+  Hip abduction 4+ 4+  Hip adduction 4+ 4+  Hip internal rotation 5 5  Hip external rotation 5 5  Knee flexion 5 5  Knee extension 4* 4  Ankle dorsiflexion    Ankle plantarflexion    Ankle inversion    Ankle eversion     (Blank rows = not tested)  LOWER EXTREMITY SPECIAL TESTS:  Hip special tests: Ely's test: positive  Knee special tests: Step up/down test: negative and Valgus/varus test: negative   FUNCTIONAL TESTS:  6 minute walk test: 1950 ft; pt reported no increase in knee pain  GAIT: Distance walked: 1950 ft Assistive device utilized: None Level of assistance: Complete Independence Comments: normal  TREATMENT DATE:   09/23/23: Pt donning bilateral knee sleeves   THEREX TM at 1.5 mph for 3.5 incline for 2  min, 4.0 grade incline for 2 min, 4.5 grade incline for 2 min, 5.0 grade incline for 3 min, 5.5 grade incline for 2 min  -Pt reports no increase in his baseline left knee pain  Squats with sleezes 1 x 10   Squats without sleezes 1 x 10  -Pt reports that his left knee feels better than without taping McConnell Taping on left knee  Squats with tape 1 x 10  -Pt reports that his left knee feels better than without taping Reviewed home exercise plan along with progressions and recommended that pt utilize bracing when performing increased LE activity.    09/10/23:  THEREX  Nu-Step with seat and arms at 11 with resistance at 4 for 5 min  Prone Quad Stretch 2 x 60 sec   PHYSICAL PERFORMANCE  Ely's Test: Positive Bilateral  Knee and Hip MMT: -Hip Flex R/L 4+/4+ -Knee Ext R/L 4+/4+ -Knee Flex R/L 4+/4+   KOOS Score 83% -Symptoms + Stiffness: 57%   -Pain 72% -Functional and Daily Living: 85%   09/03/23: THEREX  Nu-Step with seat at 9 with resistance at 2 for 6 min  Mini-Squat to 90 deg hip and knee  flexion 1 x 10  -min VC to maintain knees behind toes  Mini-Squat to 60 deg knee flexion 1 x 10  Mini-Squat to 45 deg knee flexion 1 x 10  -26 inch height mat surface  -min VC to decrease speed down and come up quick  Reverse Lunges with BUE support  3 x 10  -min VC  Heel Raises 1 x 10  Heel Raises off 4 inch step 1 x 10  Heel Raises off 4 inch step with #10 x 2 DB 1 x 10   Pt educated on need to increase walking for improved cardiovascular health with introduction of walking program.  08/26/23 TherEx  Nu step at seat 9 with resistance 3 for 6 minutes  Sit to stand to mat 1x10  Sit to stand to mat with water gallon 2x10  Modified thomas stretch 1x60 sec  Forward step ups 1x10 each leg  Backwards step downs 1x10 each leg  -pt reports increased left knee crepitus when stepping down with RLE  Leg Extensions #45 3x10   -pt instructed to stop before 0 extension when pain starts   -pt reports increased painless left knee crepitus OMEGA leg press #45 1x10  OMEGA leg press #65 1x10  OMEGA leg press #85 1x10   OMEGA leg press #105 1x20 Sit to stand with #15 DB 1x10  Sit to stand with #25 plate [id number]   PATIENT EDUCATION:  Education details: Form and technique for correct performance of exercise and explanation about exam findings  Person educated: Patient Education method: Explanation, Verbal cues, and Handouts Education comprehension: verbalized understanding and returned demonstration  HOME EXERCISE PROGRAM: Access Code: [email or web address] URL: [email or web address] Date: 09/23/2023 Prepared by: Ellin Goodie  Exercises - Prone Quadriceps Stretch with Strap  - 1 x daily - 7 x weekly - 3 reps - 60 sec  hold - Modified Thomas Stretch  - 1 x daily - 7 x weekly - 3 sets - 60sec hold - Mini Squat  - 3-4 x weekly - 3 sets - 10 reps - Reverse Lunge  - 3-4 x weekly - 3 sets - 10 reps  ASSESSMENT:  CLINICAL IMPRESSION: Pt has now  met all his rehab goals with improved knee  strength and function. He will now complete his home exercise program independently to maintain left knee strength and to maintain decreased pain. PT recommended that pt utilize patella j brace if he continues to experience increased pain with pressure placed for patella to be moved medially.  He is now ready for discharge.   OBJECTIVE IMPAIRMENTS: decreased activity tolerance, difficulty walking, decreased strength, impaired flexibility, and pain.   ACTIVITY LIMITATIONS: bending, sitting, standing, squatting, stairs, and bed mobility, walking  PARTICIPATION LIMITATIONS:  N/A  PERSONAL FACTORS: Age, Past/current experiences, Time since onset of injury/illness/exacerbation, and 1-2 comorbidities: HTN and RA  are also affecting patient's functional outcome.   REHAB POTENTIAL: Excellent  CLINICAL DECISION MAKING: Stable/uncomplicated  EVALUATION COMPLEXITY: Low   GOALS: Goals reviewed with patient? No  SHORT TERM GOALS: Target date: 08/27/2023  Pt will be independent with HEP in order to improve strength and decrease pain in order to improve pain-free function at home and work.  Baseline: NT 09/23/23:  Goal status: ACHIEVED   LONG TERM GOALS: Target date: 10/22/2023  Pt will decrease their KOOS score by at least 10 points to show significant increase in knee function.  Baseline: 70% 09/10/23: 83% Goal status: ACHIEVED   2.  Pt will increase the strength of hip flexion and knee extension by at least 1/3 MMT grade (4- to 4) to demonstrate increase of strength and function.  Baseline: Hip flexion R/L 4-/4-; Knee extension R/L 4*/4  09/10/23: Hip Flex R/L 4+/4+, Knee Ext R/L 4+/4+ Goal status: ACHIEVED   3.  Pt will have a negative Ely's test to decrease the tension from the quads on the knee joint and return to activities without knee pain.  Baseline: Positive bilaterally 09/10/23: Positive Bilateral  Goal status: ONGOING   PLAN:  PT FREQUENCY: 1-2x/week  PT DURATION: 10  weeks  PLANNED INTERVENTIONS: 97110-Therapeutic exercises, 97530- Therapeutic activity, 97112- Neuromuscular re-education, 97535- Self Care, 82956- Manual therapy, [phone]- Gait training, Patient/Family education, Stair training, Taping, Joint mobilization, Joint manipulation, DME instructions, Cryotherapy, Moist heat, and Biofeedback  PLAN FOR NEXT SESSION:  Discharge from PT.   Ellin Goodie PT, DPT  [facility] [address], [place], [address] Phone: [phone]   Fax:  [phone]

## 2023-09-27 NOTE — Progress Notes (Unsigned)
 Established patient visit  Patient: Ralph Doyle   DOB: September 29, 1950   73 y.o. Male  MRN: [id number] Visit Date: 09/28/2023  Today's healthcare provider: Debera Lat, PA-C   No chief complaint on file.  Subjective       Discussed the use of AI scribe software for clinical note transcription with the patient, who gave verbal consent to proceed.  History of Present Illness   08/18/23 decr kf, rf 17     09/14/2023   10:00 AM 09/09/2023   11:00 AM 08/18/2023    9:05 AM  Depression screen PHQ 2/9  Decreased Interest 1 0 2  Down, Depressed, Hopeless 2 1 2   PHQ - 2 Score 3 1 4   Altered sleeping 3 0 1  Tired, decreased energy 1 0 1  Change in appetite 1 0 1  Feeling bad or failure about yourself  1 0 1  Trouble concentrating 0 0 1  Moving slowly or fidgety/restless 0 0 1  Suicidal thoughts 0 0 0  PHQ-9 Score 9 1 10   Difficult doing work/chores Not difficult at all Not difficult at all Somewhat difficult      09/14/2023   10:01 AM 08/18/2023    9:06 AM 05/18/2023    9:26 AM  GAD 7 : Generalized Anxiety Score  Nervous, Anxious, on Edge 0 0 0  Control/stop worrying 1 0 0  Worry too much - different things 1 1 1   Trouble relaxing 1 1 1   Restless 0 1 0  Easily annoyed or irritable 0 0 0  Afraid - awful might happen 1 1 1   Total GAD 7 Score 4 4 3   Anxiety Difficulty Not difficult at all Somewhat difficult Somewhat difficult    Medications: Outpatient Medications Prior to Visit  Medication Sig   amLODipine (NORVASC) 5 MG tablet Take 5 mg by mouth daily.   atorvastatin (LIPITOR) 10 MG tablet Take 10 mg by mouth daily.   augmented betamethasone dipropionate (DIPROLENE-AF) 0.05 % ointment Apply 1 application topically daily as needed for rash.   bacitracin ointment Apply 1 application topically every 8 (eight) hours.   folic acid (FOLVITE) 1 MG tablet TAKE 1 TABLET BY MOUTH DAILY   hydrochlorothiazide (MICROZIDE) 12.5 MG capsule Take 1 capsule (12.5 mg total) by mouth daily.    levothyroxine (SYNTHROID) 100 MCG tablet Take 100 mcg by mouth daily.   levothyroxine (SYNTHROID) 112 MCG tablet Take 112 mcg by mouth daily.   methotrexate (RHEUMATREX) 2.5 MG tablet TAKE 5 TABLETS (12.5 MG TOTAL) BY MOUTH ONCE WEEKLY. CAUTION: CHEMOTHERAPY. PROTECT FROM LIGHT.   OVER THE COUNTER MEDICATION Apply 1 application topically daily as needed (pain). CBD cream   Polyethyl Glycol-Propyl Glycol (SYSTANE ULTRA OP) Place 1 drop into both eyes daily as needed (dry eyes).   Turmeric 400 MG CAPS Take as needed per directions   valsartan (DIOVAN) 40 MG tablet Take 1 tablet (40 mg total) by mouth daily.   No facility-administered medications prior to visit.    Review of Systems  All other systems reviewed and are negative. All negative Except see HPI   {Insert previous labs (optional):23779} {See past labs  Heme  Chem  Endocrine  Serology  Results Review (optional):1}   Objective    There were no vitals taken for this visit. {Insert last BP/Wt (optional):23777}{See vitals history (optional):1}   Physical Exam Vitals reviewed.  Constitutional:      General: He is not in acute distress.    Appearance: Normal appearance. He is not  diaphoretic.  HENT:     Head: Normocephalic and atraumatic.  Eyes:     General: No scleral icterus.    Conjunctiva/sclera: Conjunctivae normal.  Cardiovascular:     Rate and Rhythm: Normal rate and regular rhythm.     Pulses: Normal pulses.     Heart sounds: Normal heart sounds. No murmur heard. Pulmonary:     Effort: Pulmonary effort is normal. No respiratory distress.     Breath sounds: Normal breath sounds. No wheezing or rhonchi.  Musculoskeletal:     Cervical back: Neck supple.     Right lower leg: No edema.     Left lower leg: No edema.  Lymphadenopathy:     Cervical: No cervical adenopathy.  Skin:    General: Skin is warm and dry.     Findings: No rash.  Neurological:     Mental Status: He is alert and oriented to person,  place, and time. Mental status is at baseline.  Psychiatric:        Mood and Affect: Mood normal.        Behavior: Behavior normal.     No results found for any visits on 09/28/23.      Assessment and Plan Assessment & Plan     No orders of the defined types were placed in this encounter.   No follow-ups on file.   The patient was advised to call back or seek an in-person evaluation if the symptoms worsen or if the condition fails to improve as anticipated.  I discussed the assessment and treatment plan with the patient. The patient was provided an opportunity to ask questions and all were answered. The patient agreed with the plan and demonstrated an understanding of the instructions.  I, Debera Lat, PA-C have reviewed all documentation for this visit. The documentation on 09/28/2023  for the exam, diagnosis, procedures, and orders are all accurate and complete.  Debera Lat, [facility], MMS [facility] [phone] (phone) [phone] (fax)  [facility]

## 2023-09-28 ENCOUNTER — Encounter: Payer: Self-pay | Admitting: Physician Assistant

## 2023-09-28 ENCOUNTER — Ambulatory Visit (INDEPENDENT_AMBULATORY_CARE_PROVIDER_SITE_OTHER): Admitting: Physician Assistant

## 2023-09-28 VITALS — BP 107/74 | HR 52 | Temp 98.2°F | Resp 16 | Ht 68.0 in | Wt 202.0 lb

## 2023-09-28 DIAGNOSIS — R4589 Other symptoms and signs involving emotional state: Secondary | ICD-10-CM

## 2023-09-28 DIAGNOSIS — Z79899 Other long term (current) drug therapy: Secondary | ICD-10-CM

## 2023-09-28 DIAGNOSIS — E669 Obesity, unspecified: Secondary | ICD-10-CM

## 2023-09-28 DIAGNOSIS — H04123 Dry eye syndrome of bilateral lacrimal glands: Secondary | ICD-10-CM

## 2023-09-28 DIAGNOSIS — N289 Disorder of kidney and ureter, unspecified: Secondary | ICD-10-CM | POA: Insufficient documentation

## 2023-09-28 DIAGNOSIS — M069 Rheumatoid arthritis, unspecified: Secondary | ICD-10-CM

## 2023-09-28 DIAGNOSIS — R600 Localized edema: Secondary | ICD-10-CM

## 2023-09-28 DIAGNOSIS — K5909 Other constipation: Secondary | ICD-10-CM | POA: Insufficient documentation

## 2023-09-28 DIAGNOSIS — R0981 Nasal congestion: Secondary | ICD-10-CM

## 2023-09-28 DIAGNOSIS — E7849 Other hyperlipidemia: Secondary | ICD-10-CM | POA: Diagnosis not present

## 2023-09-28 DIAGNOSIS — I10 Essential (primary) hypertension: Secondary | ICD-10-CM

## 2023-09-28 DIAGNOSIS — E890 Postprocedural hypothyroidism: Secondary | ICD-10-CM | POA: Insufficient documentation

## 2023-09-29 ENCOUNTER — Ambulatory Visit: Payer: Medicare Other | Admitting: Physical Therapy

## 2023-09-29 ENCOUNTER — Encounter: Payer: Self-pay | Admitting: Physician Assistant

## 2023-09-29 LAB — LIPID PANEL
Chol/HDL Ratio: 4.8 ratio (ref 0.0–5.0)
Cholesterol, Total: 172 mg/dL (ref 100–199)
HDL: 36 mg/dL — ABNORMAL LOW (ref >39)
LDL Chol Calc (NIH): 114 mg/dL — ABNORMAL HIGH (ref 0–99)
Triglycerides: 124 mg/dL (ref 0–149)
VLDL Cholesterol Cal: 22 mg/dL (ref 5–40)

## 2023-09-29 LAB — BASIC METABOLIC PANEL WITH GFR
BUN/Creatinine Ratio: 7 — ABNORMAL LOW (ref 10–24)
BUN: 9 mg/dL (ref 8–27)
CO2: 28 mmol/L (ref 20–29)
Calcium: 9.7 mg/dL (ref 8.6–10.2)
Chloride: 101 mmol/L (ref 96–106)
Creatinine, Ser: 1.33 mg/dL — ABNORMAL HIGH (ref 0.76–1.27)
Glucose: 88 mg/dL (ref 70–99)
Potassium: 4.4 mmol/L (ref 3.5–5.2)
Sodium: 142 mmol/L (ref 134–144)
eGFR: 57 mL/min/{1.73_m2} — ABNORMAL LOW (ref >59)

## 2023-10-01 ENCOUNTER — Ambulatory Visit: Payer: Medicare Other | Admitting: Physical Therapy

## 2023-10-05 ENCOUNTER — Ambulatory Visit: Payer: Medicare Other | Admitting: Physical Therapy

## 2023-10-08 ENCOUNTER — Encounter: Payer: Medicare Other | Admitting: Physical Therapy

## 2023-10-15 ENCOUNTER — Encounter: Payer: Medicare Other | Admitting: Physical Therapy

## 2023-10-15 ENCOUNTER — Ambulatory Visit: Admitting: Physician Assistant

## 2023-10-19 ENCOUNTER — Encounter: Payer: Medicare Other | Admitting: Physical Therapy

## 2023-10-22 ENCOUNTER — Encounter: Payer: Medicare Other | Admitting: Physical Therapy

## 2023-10-27 ENCOUNTER — Encounter: Payer: Medicare Other | Admitting: Physical Therapy

## 2023-10-30 ENCOUNTER — Other Ambulatory Visit: Payer: Self-pay | Admitting: Internal Medicine

## 2023-10-30 DIAGNOSIS — M0579 Rheumatoid arthritis with rheumatoid factor of multiple sites without organ or systems involvement: Secondary | ICD-10-CM

## 2023-10-30 NOTE — Telephone Encounter (Signed)
 Last Fill: 08/03/2023  Labs: 08/10/2023 CBC WNL  09/28/2023 BMP  Creatinine 1.33 eGFR 57 BUN/Creatinine Ratio 7  Next Visit: 11/11/2023  Last Visit: 08/10/2023  DX: Rheumatoid arthritis with rheumatoid factor of multiple sites without organ or systems involvement (HCC)   Current Dose per office note 08/10/2023: methotrexate  12.5 mg p.o. weekly, Consider tapering Methotrexate  down to 4 tablets next step, if tests are normal and no flare-ups occur.   Okay to refill Methotrexate ?

## 2023-11-02 ENCOUNTER — Other Ambulatory Visit: Payer: Self-pay | Admitting: Physician Assistant

## 2023-11-02 DIAGNOSIS — E890 Postprocedural hypothyroidism: Secondary | ICD-10-CM

## 2023-11-02 NOTE — Progress Notes (Deleted)
 Office Visit Note  Patient: Ralph Doyle             Date of Birth: 05-29-1951           MRN: [id number]             PCP: Blane Bunting, PA-C Referring: Ostwalt, Janna, PA-C Visit Date: 11/11/2023   Subjective:  No chief complaint on file.   History of Present Illness: Ralph Doyle is a 73 y.o. 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 weekly and folic acid  1 mg daily.    Previous HPI 08/10/2023 Ralph Doyle is a 73 y.o. 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 weekly and folic acid  1 mg daily.     He has been experiencing knee pain over the last few days, which he attributes to increased walking. The pain is located in the front of the knee and occurs particularly when taking certain steps, but it is not constant. He engages in [place], which he finds beneficial for balance and arthritis.   His rheumatoid arthritis has been well-controlled with methotrexate , with no significant flare-ups in his shoulders, hands, or wrists.   No recent illness or significant upper respiratory symptoms, although he has been around his great-granddaughter who has been unwell.        Previous HPI 05/07/2023 Ralph Doyle is a 73 y.o. 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 weekly and folic acid  1 mg daily.  Since last visit he has been doing well with new new flareup.  No visible joint swelling.  Does not have any prolonged morning stiffness.  Not requiring as needed NSAIDs.  He is still taking turmeric daily.  Previous discoloration from the right wrist injection has resolved.  He was sick with upper respiratory illness resolved without any specific antibiotic or antiviral treatment.   Previous HPI 02/04/2023 Ralph Doyle is a 73 y.o. 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 weekly and folic acid  1 mg daily.  After our last visit right wrist pain did improve with  steroid injection though he did not wear the wrist brace as discussed at the time.  Currently symptoms are doing well at about baseline no major flareup of increased swelling or limitations.  Does get increased hand pain with prolonged guitar playing.  He developed a area of lighter pigmentation at his wrist where we did the injection in May.   Previous HPI 11/04/2022 Ralph Doyle is a 73 y.o. 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 weekly and folic acid  1 mg daily.  Currently has increased pain in the right wrist is been ongoing for about 4 to 5 weeks.  He thinks it may be associated with looking after his grandchildren since he notices some increased pain while lifting them.  There is a small amount of associated swelling.  Otherwise has some shoulder pain more on left but not in particular exacerbation.   Previous HPI 08/06/22 Ralph Doyle is a 73 y.o. 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 weekly and folic acid  1 mg daily.  Overall doing well without significant flareups.  He takes the turmeric consistently which is beneficial and as needed takes Tylenol  not every day and usually up to once or twice if he is having some increased hand pain or anticipates more  strenuous activity than usual.   Previous HPI 05/07/22 Ralph Doyle is a 73 y.o. male here for follow up for seropositive RA on MTX 12.5 mg PO weekly and folic acid  1 mg daily.  Overall symptoms are doing pretty well.  Working with occupational therapy has been beneficial for his hand pain and range of motion.  He is ended up not really trying the patch device he mentioned at the last visit I do not see significant clinical data to really recommend 1 way or the other.   Previous HPI 02/04/2022 Ralph Doyle is a 73 y.o. 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 He has not required any additional steroid treatments since our last visit.  He had some ultrasound shockwave treatment for tendonitis symptoms with a good benefit. Physical therapy has improved shoulder pain and range of motion. He is trying a new Aeon patch device.    Previous HPI 10/30/2021 Ralph Doyle is a 73 y.o. male here for follow up for seropositive RA on MTX 12.5 mg PO weekly folic acid  1 mg daily tried tapering prednisone  and trying turmeric supplementation.  Overall symptoms are doing fairly well. He has some pain in is left middle finger hands otherwise about the same as before. He is having pain at the base of the neck more than usual, going for massages these temporarily help. Pain is somewhat worse with looking side to side.   No Rheumatology ROS completed.   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   Pain in right hand 10/12/2020   Pain of left hand 10/12/2020   Flexor tenosynovitis of finger 08/15/2020   Rheumatoid arthritis with rheumatoid factor of multiple sites without organ or systems involvement (HCC) 06/14/2020   High risk medication use 06/14/2020   Thyroid malignant neoplasm (HCC) 05/16/2020    Past Medical History:  Diagnosis Date   Cancer (HCC)    Thyroid   Hyperlipidemia    Hypertension    Rheumatoid arthritis (HCC)     Family History  Problem Relation Age of Onset   Diabetes Mother    Hypertension Mother    Arthritis Mother    Diabetes  Father    Hypertension Father    Diabetes Brother    Hypertension Brother    Hypertension Brother    Past Surgical History:  Procedure Laterality Date   COLONOSCOPY WITH PROPOFOL      COLONOSCOPY WITH PROPOFOL  N/A 10/08/2021   Procedure: COLONOSCOPY WITH PROPOFOL ;  Surgeon: Marnee Sink, MD;  Location: [facility] ENDOSCOPY;  Service: Endoscopy;  Laterality: N/A;   MANDIBLE SURGERY     THYROIDECTOMY Bilateral 05/16/2020   Procedure: TOTAL THYROIDECTOMY;  Surgeon: Von Grumbling, MD;  Location: [facility] ORS;  Service: ENT;  Laterality: Bilateral;   Social History   Social History Narrative   Not on file   Immunization History  Administered Date(s) Administered   PFIZER(Purple Top)SARS-COV-2 Vaccination 08/25/2019, 09/20/2019  Tdap 04/03/2023     Objective: Vital Signs: There were no vitals taken for this visit.   Physical Exam   Musculoskeletal Exam: ***  CDAI Exam: CDAI Score: -- Patient Global: --; Provider Global: -- Swollen: --; Tender: -- Joint Exam 11/11/2023   No joint exam has been documented for this visit   There is currently no information documented on the homunculus. Go to the Rheumatology activity and complete the homunculus joint exam.  Investigation: No additional findings.  Imaging: No results found.  Recent Labs: Lab Results  Component Value Date   WBC 8.0 08/10/2023   HGB 15.6 08/10/2023   PLT 276 08/10/2023   NA 142 09/28/2023   K 4.4 09/28/2023   CL 101 09/28/2023   CO2 28 09/28/2023   GLUCOSE 88 09/28/2023   BUN 9 09/28/2023   CREATININE 1.33 (H) 09/28/2023   BILITOT 0.8 08/10/2023   ALKPHOS 70 04/07/2023   AST 13 08/10/2023   ALT 11 08/10/2023   PROT 6.7 08/10/2023   ALBUMIN 4.3 04/07/2023   CALCIUM  9.7 09/28/2023   GFRAA 69 11/06/2020    Speciality Comments: No specialty comments available.  Procedures:  No procedures performed Allergies: Patient has no known allergies.   Assessment / Plan:     Visit Diagnoses: No diagnosis  found.  ***  Orders: No orders of the defined types were placed in this encounter.  No orders of the defined types were placed in this encounter.    Follow-Up Instructions: No follow-ups on file.   Glena Landau, RT  Note - This record has been created using [employer].  Chart creation errors have been sought, but may not always  have been located. Such creation errors do not reflect on  the standard of medical care.

## 2023-11-09 ENCOUNTER — Other Ambulatory Visit: Payer: Self-pay | Admitting: Physician Assistant

## 2023-11-11 ENCOUNTER — Ambulatory Visit: Payer: Medicare Other | Admitting: Internal Medicine

## 2023-11-11 DIAGNOSIS — M0579 Rheumatoid arthritis with rheumatoid factor of multiple sites without organ or systems involvement: Secondary | ICD-10-CM

## 2023-11-11 DIAGNOSIS — G8929 Other chronic pain: Secondary | ICD-10-CM

## 2023-11-11 DIAGNOSIS — M720 Palmar fascial fibromatosis [Dupuytren]: Secondary | ICD-10-CM

## 2023-11-11 DIAGNOSIS — Z79899 Other long term (current) drug therapy: Secondary | ICD-10-CM

## 2023-11-16 ENCOUNTER — Other Ambulatory Visit: Payer: Self-pay | Admitting: Physician Assistant

## 2023-11-16 DIAGNOSIS — I10 Essential (primary) hypertension: Secondary | ICD-10-CM

## 2023-12-04 NOTE — Progress Notes (Signed)
 Office Visit Note  Patient: Ralph Doyle             Date of Birth: October 02, 1950           MRN: [id number]             PCP: Dineen Channel, PA-C Referring: Ostwalt, Janna, PA-C Visit Date: 12/18/2023   Subjective:  Follow-up (Patient states he hurt his right shoulder the other day and it Is still bothering him. Patient states his right hand went numb the other day as well. )    Discussed the use of AI scribe software for clinical note transcription with the patient, who gave verbal consent to proceed.  History of Present Illness   Ralph Doyle is a 73 y.o. 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 weekly and folic acid  1 mg daily.    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 This numbness began a few days ago in his right hand during a rehearsal and is not associated with pain, but feels 'weird' and affects his ability to play. He has not played to such a long duration in about six months prior to this incident. He denies any recent viral illnesses.  He reports shoulder pain that began after tweaking his shoulder. The pain is localized and occurs when he raises his arm, particularly during certain movements. He describes the pain as occurring in a specific area and recalls previous issues with his rotator cuff.  He has been active, working in the yard, and has not experienced any significant issues with his knees, which he has been exercising.  He takes methotrexate  for rheumatoid arthritis and reports no issues with this medication. Before starting methotrexate , he was 'kind of shut down' but has improved since its initiation.    Previous HPI 08/10/2023 Ralph Doyle is a 73 y.o. 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 weekly and folic acid  1 mg daily.     He has been experiencing knee pain over the last few days, which he attributes to increased walking.  The pain is located in the front of the knee and occurs particularly when taking certain steps, but it is not constant. He engages in [place], which he finds beneficial for balance and arthritis.   His rheumatoid arthritis has been well-controlled with methotrexate , with no significant flare-ups in his shoulders, hands, or wrists.   No recent illness or significant upper respiratory symptoms, although he has been around his great-granddaughter who has been unwell.        Previous HPI 05/07/2023 Ralph Doyle is a 73 y.o. 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 weekly and folic acid  1 mg daily.  Since last visit he has been doing well with new new flareup.  No visible joint swelling.  Does not have any prolonged morning stiffness.  Not requiring as needed NSAIDs.  He is still taking turmeric daily.  Previous discoloration from the right wrist injection has resolved.  He was sick with upper respiratory illness resolved without any specific antibiotic or antiviral treatment.   Previous HPI 02/04/2023 Ralph Doyle is a 73 y.o. 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 weekly and folic acid  1 mg daily.  After our last visit right wrist pain did improve with steroid injection though he did not wear the wrist brace as  discussed at the time.  Currently symptoms are doing well at about baseline no major flareup of increased swelling or limitations.  Does get increased hand pain with prolonged guitar playing.  He developed a area of lighter pigmentation at his wrist where we did the injection in May.   Previous HPI 11/04/2022 Ralph Doyle is a 73 y.o. 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 weekly and folic acid  1 mg daily.  Currently has increased pain in the right wrist is been ongoing for about 4 to 5 weeks.  He thinks it may be associated with looking after his grandchildren since he notices some  increased pain while lifting them.  There is a small amount of associated swelling.  Otherwise has some shoulder pain more on left but not in particular exacerbation.   Previous HPI 08/06/22 Ralph Doyle is a 73 y.o. 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 weekly and folic acid  1 mg daily.  Overall doing well without significant flareups.  He takes the turmeric consistently which is beneficial and as needed takes Tylenol  not every day and usually up to once or twice if he is having some increased hand pain or anticipates more strenuous activity than usual.   Previous HPI 05/07/22 Ralph Doyle is a 73 y.o. male here for follow up for seropositive RA on MTX 12.5 mg PO weekly and folic acid  1 mg daily.  Overall symptoms are doing pretty well.  Working with occupational therapy has been beneficial for his hand pain and range of motion.  He is ended up not really trying the patch device he mentioned at the last visit I do not see significant clinical data to really recommend 1 way or the other.   Previous HPI 02/04/2022 Ralph Doyle is a 73 y.o. 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 He has not required any additional steroid treatments since our last visit. He had some ultrasound shockwave treatment for tendonitis symptoms with a good benefit. Physical therapy has improved shoulder pain and range of motion. He is trying a new Aeon patch device.    Previous HPI 10/30/2021 Ralph Doyle is a 73 y.o. male here for follow up for seropositive RA on MTX 12.5 mg PO weekly folic acid  1 mg daily tried tapering prednisone  and trying turmeric supplementation.  Overall symptoms are doing fairly well. He has some pain in is left middle finger hands otherwise about the same as before. He is having pain at the base of the neck more than usual, going for massages these temporarily help. Pain is somewhat worse with looking side to side.   Review  of Systems  Constitutional:  Negative for fatigue.  HENT:  Negative for mouth sores and mouth dryness.   Eyes:  Positive for dryness.  Respiratory:  Negative for shortness of breath.   Cardiovascular:  Negative for chest pain and palpitations.  Gastrointestinal:  Negative for blood in stool, constipation and diarrhea.  Endocrine: Positive for increased urination.  Genitourinary:  Negative for involuntary urination.  Musculoskeletal:  Positive for joint pain, joint pain, myalgias, morning stiffness and myalgias. Negative for gait problem, joint swelling, muscle weakness and muscle tenderness.  Skin:  Negative for color change, rash, hair loss and sensitivity to sunlight.  Allergic/Immunologic: Negative for susceptible to infections.  Neurological:  Positive for dizziness. Negative for headaches.  Hematological:  Negative for swollen glands.  Psychiatric/Behavioral:  Positive  for depressed mood and sleep disturbance. The patient is nervous/anxious.     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   Pain in right hand 10/12/2020   Pain of left hand 10/12/2020   Flexor tenosynovitis of finger 08/15/2020   Rheumatoid arthritis with rheumatoid factor of multiple sites without organ or systems involvement (HCC) 06/14/2020   High risk medication use 06/14/2020   Thyroid malignant neoplasm (HCC) 05/16/2020     Past Medical History:  Diagnosis Date   Cancer (HCC)    Thyroid   Hyperlipidemia    Hypertension    Rheumatoid arthritis (HCC)     Family History  Problem Relation Age of Onset   Diabetes Mother    Hypertension Mother    Arthritis Mother    Diabetes Father    Hypertension Father    Diabetes Brother    Hypertension Brother    Hypertension Brother    Past Surgical History:  Procedure Laterality Date   COLONOSCOPY WITH PROPOFOL      COLONOSCOPY WITH PROPOFOL  N/A 10/08/2021   Procedure: COLONOSCOPY WITH PROPOFOL ;  Surgeon: Jinny Carmine, MD;  Location: [facility] ENDOSCOPY;  Service: Endoscopy;  Laterality: N/A;   MANDIBLE SURGERY     THYROIDECTOMY Bilateral 05/16/2020   Procedure: TOTAL THYROIDECTOMY;  Surgeon: Blair Mt, MD;  Location: [facility] ORS;  Service: ENT;  Laterality: Bilateral;   Social History   Social History Narrative   Not on file   Immunization History  Administered Date(s) Administered   PFIZER(Purple Top)SARS-COV-2 Vaccination 08/25/2019, 09/20/2019   Tdap 04/03/2023     Objective: Vital Signs: BP 123/78 (BP Location: Left Arm, Patient Position: Sitting, Cuff Size: Large)   Pulse (!) 53   Resp 14   Ht 5' 8 (1.727 m)   Wt 198 lb (89.8 kg)   BMI 30.11 kg/m    Physical Exam  Eyes:     Conjunctiva/sclera: Conjunctivae normal.    Cardiovascular:     Rate and Rhythm: Normal rate and regular rhythm.  Pulmonary:     Effort: Pulmonary effort is normal.     Breath sounds: Normal breath sounds.   Skin:    General: Skin is warm and dry.   Neurological:     Mental Status: He is alert.   Psychiatric:        Mood and Affect: Mood normal.      Musculoskeletal Exam:  Right shoulder pain with abduction movement, very painful empty can, tenderness to pressure on anterior side near bicipital groove, no palpable swelling Elbows full ROM no tenderness or swelling Wrists full ROM no tenderness or swelling Fingers slightly restricted extension ROM, palmar  contracture thickening proximal to 4th/5th fingers, no palpable synovitis Knees full ROM no tenderness or swelling, bilateral patellofemoral crepitus  Investigation: No additional findings.  Imaging: No results found.  Recent Labs: Lab Results  Component Value Date   WBC 8.0 08/10/2023   HGB 15.6 08/10/2023   PLT 276  08/10/2023   NA 142 09/28/2023   K 4.4 09/28/2023   CL 101 09/28/2023   CO2 28 09/28/2023   GLUCOSE 88 09/28/2023   BUN 9 09/28/2023   CREATININE 1.33 (H) 09/28/2023   BILITOT 0.8 08/10/2023   ALKPHOS 70 04/07/2023   AST 13 08/10/2023   ALT 11 08/10/2023   PROT 6.7 08/10/2023   ALBUMIN 4.3 04/07/2023   CALCIUM  9.7 09/28/2023   GFRAA 69 11/06/2020    Speciality Comments: No specialty comments available.  Procedures:  No procedures performed Allergies: Patient has no known allergies.   Assessment / Plan:     Visit Diagnoses: Rheumatoid arthritis with rheumatoid factor of multiple sites without organ or systems involvement (HCC) Managed with methotrexate . Positive rheumatoid factor supports treatment. He is interested in whether medication can be stopped or has to stay on forever. Discussed higher recurrence rate in seropositive disease. Would at least like no new complaints in a follow up visit before rducing medication more. - Checking sed rate for disease activity monitoring - Continue methotrexate  12.5 mg PO weekly  High risk medication use - methotrexate  12.5 mg p.o. weekly and folic acid  1 mg daily. Tolerating medication without any specific complaints. No serious interval infections.  -Checking CBC and CMP for medication monitoring on long term continued methotrexate   Dupuytren's contracture of both hands  Supraspinatus tendon strain Acute strain in right shoulder due to recent activity. Pain during abduction indicates supraspinatus involvement. No tear evident. - Advise rest and avoidance of shoulder strain for two weeks. - Instruct to avoid  stretching or heavy lifting with the affected shoulder. - Reassess shoulder function after two weeks of rest. - After four weeks, gradual return to normal activities including stretching and strengthening exercises.  Numbness in fingers Intermittent numbness, primarily in fingers used for playing instruments. Differential includes carpal tunnel syndrome. Possible nerve irritation in wrist. - Advise stretching exercises for the hands before and during activities. - Consider nerve conduction studies if numbness becomes recurrent.       Orders: No orders of the defined types were placed in this encounter.  No orders of the defined types were placed in this encounter.    Follow-Up Instructions: No follow-ups on file.   Lonni LELON Ester, MD  Note - This record has been created using [employer].  Chart creation errors have been sought, but may not always  have been located. Such creation errors do not reflect on  the standard of medical care.

## 2023-12-18 ENCOUNTER — Encounter: Payer: Self-pay | Admitting: Internal Medicine

## 2023-12-18 ENCOUNTER — Ambulatory Visit: Attending: Internal Medicine | Admitting: Internal Medicine

## 2023-12-18 VITALS — BP 123/78 | HR 53 | Resp 14 | Ht 68.0 in | Wt 198.0 lb

## 2023-12-18 DIAGNOSIS — R200 Anesthesia of skin: Secondary | ICD-10-CM | POA: Diagnosis present

## 2023-12-18 DIAGNOSIS — M67911 Unspecified disorder of synovium and tendon, right shoulder: Secondary | ICD-10-CM | POA: Insufficient documentation

## 2023-12-18 DIAGNOSIS — M720 Palmar fascial fibromatosis [Dupuytren]: Secondary | ICD-10-CM | POA: Insufficient documentation

## 2023-12-18 DIAGNOSIS — M25562 Pain in left knee: Secondary | ICD-10-CM | POA: Diagnosis present

## 2023-12-18 DIAGNOSIS — M25561 Pain in right knee: Secondary | ICD-10-CM | POA: Insufficient documentation

## 2023-12-18 DIAGNOSIS — M75102 Unspecified rotator cuff tear or rupture of left shoulder, not specified as traumatic: Secondary | ICD-10-CM | POA: Diagnosis present

## 2023-12-18 DIAGNOSIS — M12811 Other specific arthropathies, not elsewhere classified, right shoulder: Secondary | ICD-10-CM | POA: Diagnosis present

## 2023-12-18 DIAGNOSIS — M0579 Rheumatoid arthritis with rheumatoid factor of multiple sites without organ or systems involvement: Secondary | ICD-10-CM | POA: Insufficient documentation

## 2023-12-18 DIAGNOSIS — M12812 Other specific arthropathies, not elsewhere classified, left shoulder: Secondary | ICD-10-CM | POA: Insufficient documentation

## 2023-12-18 DIAGNOSIS — M75101 Unspecified rotator cuff tear or rupture of right shoulder, not specified as traumatic: Secondary | ICD-10-CM | POA: Insufficient documentation

## 2023-12-18 DIAGNOSIS — Z79899 Other long term (current) drug therapy: Secondary | ICD-10-CM | POA: Insufficient documentation

## 2023-12-18 DIAGNOSIS — G8929 Other chronic pain: Secondary | ICD-10-CM | POA: Diagnosis present

## 2023-12-18 NOTE — Patient Instructions (Addendum)
 I believe your right shoulder pain is from some irritation of the supraspinatus tendon on that side. This usually improves with conservative treatments within a few weeks.  I recommend resting your shoulder from any heavy lifting or doing any dedicated exercises for it for 2 weeks, and after that can add back stretches and exercise as long as it is not painful.  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 Brunilda Capra previously. This can make those areas more susceptible to repeat injuries or inflammation.  Strengthening exercises External rotation  Sit in a stable chair without armrests. Secure an exercise band to a stable object at elbow height on your left / right side. Place a soft object, such as a folded towel or a small pillow, between your left / right upper arm and your body to move your elbow about 4 inches (10 cm) away from your side. Hold the end of the exercise band so it is tight and there is no slack. Keeping your elbow pressed against the soft object, slowly move your forearm out, away from your abdomen (external rotation). Keep your body steady so only your forearm moves. Hold for __________ seconds. Slowly return to the starting position. Repeat __________ times. Complete this exercise __________ times a day. Shoulder abduction  Sit in a stable chair without armrests, or stand up. Hold a __________ lb / kg weight in your left / right hand, or hold an exercise band with both hands. Start with your arms straight down and your left / right palm facing in, toward your body. Slowly lift your left / right hand out to your side (abduction). Do not lift your hand above shoulder height unless your health care provider tells you that this is safe. Keep your arms straight. Avoid shrugging your shoulder while you do this movement. Keep your shoulder blade tucked down toward the middle of your back. Hold for __________ seconds. Slowly lower  your arm, and return to the starting position. Repeat __________ times. Complete this exercise __________ times a day. Shoulder extension  Sit in a stable chair without armrests, or stand up. Secure an exercise band to a stable object in front of you so it is at shoulder height. Hold one end of the exercise band in each hand. Straighten your elbows and lift your hands up to shoulder height. Squeeze your shoulder blades together as you pull your hands down to the sides of your thighs (extension). Stop when your hands are straight down by your sides. Do not let your hands go behind your body. Hold for __________ seconds. Slowly return to the starting position. Repeat __________ times. Complete this exercise __________ times a day. Shoulder row  Sit in a stable chair without armrests, or stand up. Secure an exercise band to a stable object in front of you so it is at chest height. Hold one end of the exercise band in each hand. Position your palms so that your thumbs are facing the ceiling (neutral position). Bend each of your elbows to a 90-degree angle (right angle) and keep your upper arms at your sides. Step back or move the chair back until the band is tight and there is no slack. Slowly pull your elbows back behind you. Hold for __________ seconds. Slowly return to the starting position. Repeat __________ times. Complete this exercise __________ times a day. Shoulder press-ups  Sit in a stable chair that has armrests. Sit upright, with your feet  flat on the floor. Put your hands on the armrests so your elbows are bent and your fingers are pointing forward. Your hands should be about even with the sides of your body. Push down on the armrests and use your arms to lift yourself off the chair. Straighten your elbows and lift yourself up as much as you comfortably can. Move your shoulder blades down, and avoid letting your shoulders move up toward your ears. Keep your feet on the ground.  As you get stronger, your feet should support less of your body weight as you lift yourself up. Hold for __________ seconds. Slowly lower yourself back into the chair. Repeat __________ times. Complete this exercise __________ times a day.

## 2023-12-19 ENCOUNTER — Encounter: Payer: Self-pay | Admitting: Internal Medicine

## 2023-12-19 LAB — COMPREHENSIVE METABOLIC PANEL WITH GFR
AG Ratio: 2 (calc) (ref 1.0–2.5)
ALT: 12 U/L (ref 9–46)
AST: 14 U/L (ref 10–35)
Albumin: 4.2 g/dL (ref 3.6–5.1)
Alkaline phosphatase (APISO): 54 U/L (ref 35–144)
BUN/Creatinine Ratio: 9 (calc) (ref 6–22)
BUN: 13 mg/dL (ref 7–25)
CO2: 28 mmol/L (ref 20–32)
Calcium: 9.7 mg/dL (ref 8.6–10.3)
Chloride: 103 mmol/L (ref 98–110)
Creat: 1.38 mg/dL — ABNORMAL HIGH (ref 0.70–1.28)
Globulin: 2.1 g/dL (ref 1.9–3.7)
Glucose, Bld: 83 mg/dL (ref 65–99)
Potassium: 4.5 mmol/L (ref 3.5–5.3)
Sodium: 141 mmol/L (ref 135–146)
Total Bilirubin: 0.7 mg/dL (ref 0.2–1.2)
Total Protein: 6.3 g/dL (ref 6.1–8.1)
eGFR: 55 mL/min/{1.73_m2} — ABNORMAL LOW (ref >=60)

## 2023-12-19 LAB — CBC WITH DIFFERENTIAL/PLATELET
Absolute Lymphocytes: 1552 {cells}/uL (ref 850–3900)
Absolute Monocytes: 632 {cells}/uL (ref 200–950)
Basophils Absolute: 47 {cells}/uL (ref 0–200)
Basophils Relative: 0.6 %
Eosinophils Absolute: 78 {cells}/uL (ref 15–500)
Eosinophils Relative: 1 %
HCT: 46.4 % (ref 38.5–50.0)
Hemoglobin: 15.2 g/dL (ref 13.2–17.1)
MCH: 32.6 pg (ref 27.0–33.0)
MCHC: 32.8 g/dL (ref 32.0–36.0)
MCV: 99.6 fL (ref 80.0–100.0)
MPV: 9.9 fL (ref 7.5–12.5)
Monocytes Relative: 8.1 %
Neutro Abs: 5491 {cells}/uL (ref 1500–7800)
Neutrophils Relative %: 70.4 %
Platelets: 285 10*3/uL (ref 140–400)
RBC: 4.66 10*6/uL (ref 4.20–5.80)
RDW: 14.5 % (ref 11.0–15.0)
Total Lymphocyte: 19.9 %
WBC: 7.8 10*3/uL (ref 3.8–10.8)

## 2023-12-19 LAB — SEDIMENTATION RATE: Sed Rate: 2 mm/h (ref 0–20)

## 2023-12-21 ENCOUNTER — Ambulatory Visit: Payer: Self-pay | Admitting: Internal Medicine

## 2023-12-21 NOTE — Progress Notes (Signed)
 Sed rate and blood count remain normal.  Metabolic panel is the same so no problem for continuing methotrexate . Estimated GFR of 55 is just slightly below normal but not significantly different from 57 last time.

## 2023-12-28 ENCOUNTER — Ambulatory Visit: Admitting: Physician Assistant

## 2024-01-14 ENCOUNTER — Ambulatory Visit (INDEPENDENT_AMBULATORY_CARE_PROVIDER_SITE_OTHER): Admitting: Physician Assistant

## 2024-01-14 ENCOUNTER — Encounter: Payer: Self-pay | Admitting: Physician Assistant

## 2024-01-14 VITALS — BP 123/77 | HR 80 | Resp 16 | Ht 68.0 in | Wt 196.0 lb

## 2024-01-14 DIAGNOSIS — N289 Disorder of kidney and ureter, unspecified: Secondary | ICD-10-CM

## 2024-01-14 DIAGNOSIS — M25519 Pain in unspecified shoulder: Secondary | ICD-10-CM

## 2024-01-14 DIAGNOSIS — M069 Rheumatoid arthritis, unspecified: Secondary | ICD-10-CM | POA: Diagnosis not present

## 2024-01-14 DIAGNOSIS — N1831 Chronic kidney disease, stage 3a: Secondary | ICD-10-CM

## 2024-01-14 DIAGNOSIS — E7849 Other hyperlipidemia: Secondary | ICD-10-CM

## 2024-01-14 DIAGNOSIS — E890 Postprocedural hypothyroidism: Secondary | ICD-10-CM | POA: Diagnosis not present

## 2024-01-14 DIAGNOSIS — R4589 Other symptoms and signs involving emotional state: Secondary | ICD-10-CM

## 2024-01-14 DIAGNOSIS — E669 Obesity, unspecified: Secondary | ICD-10-CM

## 2024-01-14 DIAGNOSIS — G8929 Other chronic pain: Secondary | ICD-10-CM

## 2024-01-14 DIAGNOSIS — Z125 Encounter for screening for malignant neoplasm of prostate: Secondary | ICD-10-CM

## 2024-01-14 DIAGNOSIS — I10 Essential (primary) hypertension: Secondary | ICD-10-CM

## 2024-01-14 DIAGNOSIS — K5909 Other constipation: Secondary | ICD-10-CM

## 2024-01-14 DIAGNOSIS — R0981 Nasal congestion: Secondary | ICD-10-CM

## 2024-01-14 DIAGNOSIS — C73 Malignant neoplasm of thyroid gland: Secondary | ICD-10-CM

## 2024-01-14 MED ORDER — ATORVASTATIN CALCIUM 20 MG PO TABS
20.0000 mg | ORAL_TABLET | Freq: Every day | ORAL | 1 refills | Status: DC
Start: 2024-01-14 — End: 2024-04-25

## 2024-01-14 NOTE — Progress Notes (Signed)
 Established patient visit  Patient: Ralph Doyle   DOB: [birth year]   72 y.o. Male  MRN: [id number] Visit Date: 01/14/2024  Today's healthcare provider: Jolynn Spencer, PA-C   Chief Complaint  Patient presents with   Follow-up    3 month f/u no other concerns    Subjective     HPI     Follow-up    Additional comments: 3 month f/u no other concerns       Last edited by Marylen Odella CROME, CMA on 01/14/2024 10:01 AM.       Discussed the use of AI scribe software for clinical note transcription with the patient, who gave verbal consent to proceed.  History of Present Illness Ralph Doyle is a 73 year old male who presents for a routine follow-up.  He manages hypertension with amlodipine , hydrochlorothiazide , and valsartan , adheres to a low salt diet, and exercises regularly. Kidney function has decreased from 59 to 55 since June, and he is increasing water intake. He takes methotrexate  and levothyroxine, though he is unsure of the current prescriber for levothyroxine. He is on cholesterol medication at a dose of 10 mcg, which may need clarification.  He sustained a shoulder injury while installing a baby seat, resulting in pain for about three weeks, and uses patches for relief. He experiences dry eyes, particularly at night, and uses eye drops without vision problems.  He recalls a positive Cologuard test and a colonoscopy a few years ago, which revealed a polyp. He does not remember receiving a follow-up letter but notes a suggestion to repeat the procedure in seven years.   The 10-year ASCVD risk score (Arnett DK, et al., 2019) is: 19.2%     09/28/2023    8:36 AM 09/14/2023   10:00 AM 09/09/2023   11:00 AM  Depression screen PHQ 2/9  Decreased Interest 2 1 0  Down, Depressed, Hopeless 2 2 1   PHQ - 2 Score 4 3 1   Altered sleeping 2 3 0  Tired, decreased energy 1 1 0  Change in appetite 1 1 0  Feeling bad or failure about yourself  2 1 0  Trouble concentrating 1 0 0   Moving slowly or fidgety/restless 0 0 0  Suicidal thoughts 0 0 0  PHQ-9 Score 11 9 1   Difficult doing work/chores Not difficult at all Not difficult at all Not difficult at all      09/28/2023    8:37 AM 09/14/2023   10:01 AM 08/18/2023    9:06 AM 05/18/2023    9:26 AM  GAD 7 : Generalized Anxiety Score  Nervous, Anxious, on Edge 0 0 0 0  Control/stop worrying 0 1 0 0  Worry too much - different things 1 1 1 1   Trouble relaxing 0 1 1 1   Restless 1 0 1 0  Easily annoyed or irritable 0 0 0 0  Afraid - awful might happen 0 1 1 1   Total GAD 7 Score 2 4 4 3   Anxiety Difficulty Not difficult at all Not difficult at all Somewhat difficult Somewhat difficult    Medications: Outpatient Medications Prior to Visit  Medication Sig   amLODipine  (NORVASC ) 5 MG tablet TAKE 1 TABLET BY MOUTH DAILY WITH LIPITOR   atorvastatin  (LIPITOR) 10 MG tablet TAKE 1 TABLET BY MOUTH AT BEDTIME   augmented betamethasone dipropionate (DIPROLENE-AF) 0.05 % ointment Apply 1 application topically daily as needed for rash.   bacitracin  ointment Apply 1 application topically every 8 (eight) hours.   folic  acid (FOLVITE ) 1 MG tablet TAKE 1 TABLET BY MOUTH DAILY   hydrochlorothiazide  (MICROZIDE ) 12.5 MG capsule TAKE 1 CAPSULE BY MOUTH DAILY   levothyroxine (SYNTHROID) 100 MCG tablet TAKE 1 TABLET BY MOUTH DAILY   levothyroxine (SYNTHROID) 112 MCG tablet Take 112 mcg by mouth daily.   methotrexate  (RHEUMATREX) 2.5 MG tablet TAKE 5 TABLETS (12.5 MG TOTAL) BY MOUTH ONCE WEEKLY. CAUTION: CHEMOTHERAPY. PROTECT FROM LIGHT.   OVER THE COUNTER MEDICATION Apply 1 application topically daily as needed (pain). CBD cream   Polyethyl Glycol-Propyl Glycol (SYSTANE ULTRA OP) Place 1 drop into both eyes daily as needed (dry eyes).   Turmeric 400 MG CAPS Take as needed per directions   valsartan  (DIOVAN ) 40 MG tablet Take 1 tablet (40 mg total) by mouth daily.   No facility-administered medications prior to visit.    Review of  Systems All negative Except see HPI       Objective    BP 123/77 (BP Location: Right Arm, Patient Position: Sitting, Cuff Size: Large)   Pulse 80   Resp 16   Ht 5' 8 (1.727 m)   Wt 196 lb (88.9 kg)   SpO2 99%   BMI 29.80 kg/m     Physical Exam Vitals reviewed.  Constitutional:      General: He is not in acute distress.    Appearance: Normal appearance. He is not diaphoretic.  HENT:     Head: Normocephalic and atraumatic.  Eyes:     General: No scleral icterus.    Conjunctiva/sclera: Conjunctivae normal.  Cardiovascular:     Rate and Rhythm: Normal rate and regular rhythm.     Pulses: Normal pulses.     Heart sounds: Normal heart sounds. No murmur heard. Pulmonary:     Effort: Pulmonary effort is normal. No respiratory distress.     Breath sounds: Normal breath sounds. No wheezing or rhonchi.  Musculoskeletal:     Cervical back: Neck supple.     Right lower leg: No edema.     Left lower leg: No edema.  Lymphadenopathy:     Cervical: No cervical adenopathy.  Skin:    General: Skin is warm and dry.     Findings: No rash.  Neurological:     Mental Status: He is alert and oriented to person, place, and time. Mental status is at baseline.  Psychiatric:        Mood and Affect: Mood normal.        Behavior: Behavior normal.      No results found for any visits on 01/14/24.      Assessment & Plan Shoulder Pain Per chart review from 12/18/23, supraspinatus tendon strain was diagnosed. Was advised to rest before returning to normal activities. However, pain persist Pain possibly related to rotator cuff issues. Discussed benefits of physical therapy. - Rest shoulder and apply heat. - Consider physical therapy if covered by insurance. - Schedule appointment with orthopedic surgeon if pain persists and pt agrees.  Chronic Kidney Disease Decreased kidney function with eGFR dropping from 59 to 55, likely due to inadequate hydration. Emphasized increasing water  intake. - Increase water intake to 8-12 glasses per day. - Avoid NSAIDs such as ibuprofen and Advil.  Hypertension Chronic and stable Blood pressure well-controlled with current medications. - Continue current medications: amlodipine , hydrochlorothiazide , valsartan . - Maintain low salt diet and regular exercise. Will follow-up  Hyperlipidemia Chronic and stable Plan to increase dosage due to suboptimal cholesterol levels. Monitor for muscle pain as a side  effect. - Increase cholesterol medication to 20 mcg. - Monitor for muscle and joint pain.  Hypothyroidism Chronic and stable He is on levothyroxine. No current endocrinologist following retirement of previous provider. - Continue levothyroxine. Will follow-up  Dry Eyes Experiences dry eyes, especially at night. Recommended to schedule an appointment with an ophthalmologist. - Continue using eye drops as needed. - Schedule appointment with ophthalmologist.  Other hyperlipidemia (Primary)  - atorvastatin  (LIPITOR) 20 MG tablet; Take 1 tablet (20 mg total) by mouth daily.  Dispense: 90 tablet; Refill: 1  Rheumatoid arthritis, involving unspecified site, unspecified whether rheumatoid factor present [facility]) Managed by rheumatology Continue methotrexate  Will follow-up  Will repeat labs at the follow-up  No orders of the defined types were placed in this encounter.   No follow-ups on file.   The patient was advised to call back or seek an in-person evaluation if the symptoms worsen or if the condition fails to improve as anticipated.  I discussed the assessment and treatment plan with the patient. The patient was provided an opportunity to ask questions and all were answered. The patient agreed with the plan and demonstrated an understanding of the instructions.  I, Nahiem Dredge, PA-C have reviewed all documentation for this visit. The documentation on 01/14/2024  for the exam, diagnosis, procedures, and orders are all  accurate and complete.  Jolynn Spencer, [facility], MMS [facility] [phone] (phone) [phone] (fax)  [facility]

## 2024-01-17 DIAGNOSIS — N1831 Chronic kidney disease, stage 3a: Secondary | ICD-10-CM | POA: Insufficient documentation

## 2024-01-28 ENCOUNTER — Other Ambulatory Visit: Payer: Self-pay | Admitting: Internal Medicine

## 2024-01-28 DIAGNOSIS — M0579 Rheumatoid arthritis with rheumatoid factor of multiple sites without organ or systems involvement: Secondary | ICD-10-CM

## 2024-01-28 NOTE — Telephone Encounter (Signed)
 Last Fill: 10/30/2023  Labs: 12/18/2023 Sed rate and blood count remain normal. Metabolic panel is the same so no problem for continuing methotrexate . Estimated GFR of 55 is just slightly below normal but not significantly different from 57 last time.   Next Visit: 03/24/2024  Last Visit: 12/18/2023  DX: Rheumatoid arthritis with rheumatoid factor of multiple sites without organ or systems involvement (HCC)   Current Dose per office note 12/18/2023:  methotrexate  12.5 mg PO weekly   Okay to refill Methotrexate ?

## 2024-02-10 ENCOUNTER — Other Ambulatory Visit: Payer: Self-pay | Admitting: Physician Assistant

## 2024-02-10 DIAGNOSIS — E890 Postprocedural hypothyroidism: Secondary | ICD-10-CM

## 2024-02-16 ENCOUNTER — Other Ambulatory Visit: Payer: Self-pay | Admitting: Physician Assistant

## 2024-02-16 DIAGNOSIS — I10 Essential (primary) hypertension: Secondary | ICD-10-CM

## 2024-03-01 ENCOUNTER — Emergency Department
Admission: EM | Admit: 2024-03-01 | Discharge: 2024-03-01 | Disposition: A | Discharge status: Home / Self Care | Attending: Emergency Medicine | Admitting: Emergency Medicine

## 2024-03-01 ENCOUNTER — Other Ambulatory Visit: Payer: Self-pay

## 2024-03-01 DIAGNOSIS — U071 COVID-19: Secondary | ICD-10-CM | POA: Insufficient documentation

## 2024-03-01 DIAGNOSIS — I10 Essential (primary) hypertension: Secondary | ICD-10-CM | POA: Insufficient documentation

## 2024-03-01 DIAGNOSIS — R059 Cough, unspecified: Secondary | ICD-10-CM | POA: Diagnosis present

## 2024-03-01 LAB — RESP PANEL BY RT-PCR (RSV, FLU A&B, COVID)  RVPGX2
Influenza A by PCR: NEGATIVE
Influenza B by PCR: NEGATIVE
Resp Syncytial Virus by PCR: NEGATIVE
SARS Coronavirus 2 by RT PCR: POSITIVE — AB

## 2024-03-01 NOTE — Discharge Instructions (Signed)
 Return to the ER for new, worsening, or persistent cough, shortness of breath, weakness, high fever, vomiting, or any other new or worsening symptoms that concern you.

## 2024-03-01 NOTE — ED Triage Notes (Signed)
 Pt comes with cough and cold like symptoms since WEd last week. Pt denies any fevers. Pt states some body aches and pains. Pt wanting covid test.

## 2024-03-01 NOTE — ED Provider Notes (Signed)
 [facility] Provider Note    Event Date/Time   First MD Initiated Contact with Patient 03/01/24 [phone]     (approximate)   History   Cough   HPI  Ralph Doyle is a 73 y.o. male with a history of hypertension who presents with nasal congestion, runny nose, cough, and bodyaches since about 5 days ago.  He states that the symptoms were worse last week and have improved over the last couple days, however since he is still sick he is concerned he may have COVID and wanted to get tested.  He denies feeling short of breath.  He has no fevers.  I reviewed the past medical records.  The patient's most recent outpatient encounter was with family medicine on 7/17 for a routine follow-up visit.   Physical Exam   Triage Vital Signs: ED Triage Vitals  Encounter Vitals Group     BP 03/01/24 0834 (!) 145/89     Girls Systolic BP Percentile --      Girls Diastolic BP Percentile --      Boys Systolic BP Percentile --      Boys Diastolic BP Percentile --      Pulse Rate 03/01/24 0834 (!) 50     Resp 03/01/24 0834 18     Temp 03/01/24 0834 97.8 F (36.6 C)     Temp Source 03/01/24 0834 Oral     SpO2 03/01/24 0834 100 %     Weight 03/01/24 0831 195 lb (88.5 kg)     Height 03/01/24 0831 5' 8 (1.727 m)     Head Circumference --      Peak Flow --      Pain Score 03/01/24 0831 4     Pain Loc --      Pain Education --      Exclude from Growth Chart --     Most recent vital signs: Vitals:   03/01/24 0834  BP: (!) 145/89  Pulse: (!) 50  Resp: 18  Temp: 97.8 F (36.6 C)  SpO2: 100%     General: Alert, well-appearing, no distress.  CV:  Good peripheral perfusion.  Resp:  Normal effort.  Lungs CTAB. Abd:  No distention.  Other:  No significant nasal congestion.   ED Results / Procedures / Treatments   Labs (all labs ordered are listed, but only abnormal results are displayed) Labs Reviewed  RESP PANEL BY RT-PCR (RSV, FLU A&B, COVID)  [id number] -  Abnormal; Notable for the following components:      Result Value   SARS Coronavirus 2 by RT PCR POSITIVE (*)    All other components within normal limits     EKG     RADIOLOGY    PROCEDURES:  Critical Care performed: No  Procedures   MEDICATIONS ORDERED IN ED: Medications - No data to display   IMPRESSION / MDM / ASSESSMENT AND PLAN / ED COURSE  I reviewed the triage vital signs and the nursing notes.  Differential diagnosis includes, but is not limited to, viral URI, COVID.  There is no clinical evidence of pneumonia.  The patient is not hypoxic, has no shortness of breath, and breath sounds are normal.  There is no indication for imaging.  We will check a COVID swab per the patient's request.  Patient's presentation is most consistent with acute, uncomplicated illness.  ----------------------------------------- 10:15 AM on 03/01/2024 -----------------------------------------  COVID is positive.  However since the patient has been symptomatic for almost a week,  there is no indication for Paxlovid or other treatment.  He states his symptoms are improving.  He is stable for discharge home.  Return precautions given, and he expresses understanding.   FINAL CLINICAL IMPRESSION(S) / ED DIAGNOSES   Final diagnoses:  COVID-19     Rx / DC Orders   ED Discharge Orders     None        Note:  This document was prepared using Dragon voice recognition software and may include unintentional dictation errors.    Jacolyn Pae, MD 03/01/24 1341

## 2024-03-04 ENCOUNTER — Ambulatory Visit: Admitting: Physician Assistant

## 2024-03-10 NOTE — Progress Notes (Deleted)
 Office Visit Note  Patient: Ralph Doyle             Date of Birth: 12-31-1950           MRN: [id number]             PCP: Dineen Channel, PA-C Referring: Ostwalt, Janna, PA-C Visit Date: 03/24/2024   Subjective:  No chief complaint on file.   History of Present Illness: Ralph Doyle is a 73 y.o. 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 weekly and folic acid  1 mg daily.     Previous HPI 12/18/2023 Ralph Doyle is a 73 y.o. 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 weekly and folic acid  1 mg daily.     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 This numbness began a few days ago in his right hand during a rehearsal and is not associated with pain, but feels 'weird' and affects his ability to play. He has not played to such a long duration in about six months prior to this incident. He denies any recent viral illnesses.   He reports shoulder pain that began after tweaking his shoulder. The pain is localized and occurs when he raises his arm, particularly during certain movements. He describes the pain as occurring in a specific area and recalls previous issues with his rotator cuff.   He has been active, working in the yard, and has not experienced any significant issues with his knees, which he has been exercising.   He takes methotrexate  for rheumatoid arthritis and reports no issues with this medication. Before starting methotrexate , he was 'kind of shut down' but has improved since its initiation.      Previous HPI 08/10/2023 Ralph Doyle is a 73 y.o. 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 weekly and folic acid  1 mg daily.     He has been experiencing knee pain over the last few days, which he attributes to increased walking. The pain is located in the front of the knee and occurs particularly when taking certain steps,  but it is not constant. He engages in [place], which he finds beneficial for balance and arthritis.   His rheumatoid arthritis has been well-controlled with methotrexate , with no significant flare-ups in his shoulders, hands, or wrists.   No recent illness or significant upper respiratory symptoms, although he has been around his great-granddaughter who has been unwell.        Previous HPI 05/07/2023 Ralph Doyle is a 73 y.o. 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 weekly and folic acid  1 mg daily.  Since last visit he has been doing well with new new flareup.  No visible joint swelling.  Does not have any prolonged morning stiffness.  Not requiring as needed NSAIDs.  He is still taking turmeric daily.  Previous discoloration from the right wrist injection has resolved.  He was sick with upper respiratory illness resolved without any specific antibiotic or antiviral treatment.   Previous HPI 02/04/2023 Ralph Doyle is a 73 y.o. 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 weekly and folic acid  1 mg daily.  After our last visit right wrist pain did improve with steroid injection though he did not wear the wrist brace as discussed at the time.  Currently symptoms are doing well  at about baseline no major flareup of increased swelling or limitations.  Does get increased hand pain with prolonged guitar playing.  He developed a area of lighter pigmentation at his wrist where we did the injection in May.   Previous HPI 11/04/2022 Ralph Doyle is a 73 y.o. 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 weekly and folic acid  1 mg daily.  Currently has increased pain in the right wrist is been ongoing for about 4 to 5 weeks.  He thinks it may be associated with looking after his grandchildren since he notices some increased pain while lifting them.  There is a small amount of associated swelling.  Otherwise has  some shoulder pain more on left but not in particular exacerbation.   Previous HPI 08/06/22 Ralph Doyle is a 73 y.o. 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 weekly and folic acid  1 mg daily.  Overall doing well without significant flareups.  He takes the turmeric consistently which is beneficial and as needed takes Tylenol  not every day and usually up to once or twice if he is having some increased hand pain or anticipates more strenuous activity than usual.   Previous HPI 05/07/22 Ralph Doyle is a 73 y.o. male here for follow up for seropositive RA on MTX 12.5 mg PO weekly and folic acid  1 mg daily.  Overall symptoms are doing pretty well.  Working with occupational therapy has been beneficial for his hand pain and range of motion.  He is ended up not really trying the patch device he mentioned at the last visit I do not Doyle significant clinical data to really recommend 1 way or the other.   Previous HPI 02/04/2022 Ralph Doyle is a 73 y.o. 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 He has not required any additional steroid treatments since our last visit. He had some ultrasound shockwave treatment for tendonitis symptoms with a good benefit. Physical therapy has improved shoulder pain and range of motion. He is trying a new Aeon patch device.    Previous HPI 10/30/2021 Ralph Doyle is a 73 y.o. male here for follow up for seropositive RA on MTX 12.5 mg PO weekly folic acid  1 mg daily tried tapering prednisone  and trying turmeric supplementation.  Overall symptoms are doing fairly well. He has some pain in is left middle finger hands otherwise about the same as before. He is having pain at the base of the neck more than usual, going for massages these temporarily help. Pain is somewhat worse with looking side to side.   No Rheumatology ROS completed.   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   Obesity (BMI 30-39.9) 08/18/2023   Dry eye syndrome of both eyes 08/18/2023   Rheumatoid arthritis (HCC) 08/18/2023   Primary hypertension 05/05/2023   Other hyperlipidemia 05/05/2023   Radial styloid tenosynovitis (de quervain) 11/04/2022   Dupuytren's contracture of both hands 05/07/2022   Neck pain 10/30/2021   Positive colorectal cancer screening using Cologuard test    Polyp of transverse colon    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   Pain in right hand 10/12/2020   Pain of  left hand 10/12/2020   Flexor tenosynovitis of finger 08/15/2020   Rheumatoid arthritis with rheumatoid factor of multiple sites without organ or systems involvement (HCC) 06/14/2020   High risk medication use 06/14/2020   Thyroid malignant neoplasm (HCC) 05/16/2020    Past Medical History:  Diagnosis Date   Cancer (HCC)    Thyroid   Hyperlipidemia    Hypertension    Rheumatoid arthritis (HCC)     Family History  Problem Relation Age of Onset   Diabetes Mother    Hypertension Mother    Arthritis Mother    Diabetes Father    Hypertension Father    Diabetes Brother    Hypertension Brother    Hypertension Brother    Past Surgical History:  Procedure Laterality Date   COLONOSCOPY WITH PROPOFOL      COLONOSCOPY WITH PROPOFOL  N/A 10/08/2021   Procedure: COLONOSCOPY WITH PROPOFOL ;  Surgeon: Jinny Carmine, MD;  Location: [facility];  Service: Endoscopy;  Laterality: N/A;   MANDIBLE SURGERY     THYROIDECTOMY Bilateral 05/16/2020   Procedure: TOTAL THYROIDECTOMY;  Surgeon: Blair Mt, MD;  Location: [facility];  Service: ENT;  Laterality: Bilateral;   Social History   Social History Narrative   Not on file   Immunization History  Administered  Date(s) Administered   PFIZER(Purple Top)SARS-COV-2 Vaccination 08/25/2019, 09/20/2019   Tdap 04/03/2023     Objective: Vital Signs: There were no vitals taken for this visit.   Physical Exam   Musculoskeletal Exam: ***  CDAI Exam: CDAI Score: -- Patient Global: --; Provider Global: -- Swollen: --; Tender: -- Joint Exam 03/24/2024   No joint exam has been documented for this visit   There is currently no information documented on the homunculus. Go to the Rheumatology activity and complete the homunculus joint exam.  Investigation: No additional findings.  Imaging: No results found.  Recent Labs: Lab Results  Component Value Date   WBC 7.8 12/18/2023   HGB 15.2 12/18/2023   PLT 285 12/18/2023   NA 141 12/18/2023   K 4.5 12/18/2023   CL 103 12/18/2023   CO2 28 12/18/2023   GLUCOSE 83 12/18/2023   BUN 13 12/18/2023   CREATININE 1.38 (H) 12/18/2023   BILITOT 0.7 12/18/2023   ALKPHOS 70 04/07/2023   AST 14 12/18/2023   ALT 12 12/18/2023   PROT 6.3 12/18/2023   ALBUMIN 4.3 04/07/2023   CALCIUM  9.7 12/18/2023   GFRAA 69 11/06/2020    Speciality Comments: No specialty comments available.  Procedures:  No procedures performed Allergies: Patient has no known allergies.   Assessment / Plan:     Visit Diagnoses: No diagnosis found.  ***  Orders: No orders of the defined types were placed in this encounter.  No orders of the defined types were placed in this encounter.    Follow-Up Instructions: No follow-ups on file.   Saia Derossett M Caroll Weinheimer, CMA  Note - This record has been created using [employer].  Chart creation errors have been sought, but may not always  have been located. Such creation errors do not reflect on  the standard of medical care.

## 2024-03-22 ENCOUNTER — Ambulatory Visit: Attending: Internal Medicine | Admitting: Internal Medicine

## 2024-03-22 ENCOUNTER — Encounter: Payer: Self-pay | Admitting: Internal Medicine

## 2024-03-22 VITALS — BP 115/65 | HR 61 | Temp 97.7°F | Resp 16 | Ht 68.0 in | Wt 194.0 lb

## 2024-03-22 DIAGNOSIS — M67911 Unspecified disorder of synovium and tendon, right shoulder: Secondary | ICD-10-CM | POA: Diagnosis not present

## 2024-03-22 DIAGNOSIS — M720 Palmar fascial fibromatosis [Dupuytren]: Secondary | ICD-10-CM | POA: Insufficient documentation

## 2024-03-22 DIAGNOSIS — M0579 Rheumatoid arthritis with rheumatoid factor of multiple sites without organ or systems involvement: Secondary | ICD-10-CM | POA: Diagnosis present

## 2024-03-22 DIAGNOSIS — N1831 Chronic kidney disease, stage 3a: Secondary | ICD-10-CM | POA: Insufficient documentation

## 2024-03-22 DIAGNOSIS — Z79899 Other long term (current) drug therapy: Secondary | ICD-10-CM | POA: Insufficient documentation

## 2024-03-22 NOTE — Progress Notes (Signed)
 Office Visit Note  Patient: Ralph Doyle             Date of Birth: Jul 21, 1950           MRN: [id number]             PCP: Dineen Channel, PA-C Referring: Ostwalt, Janna, PA-C Visit Date: 03/22/2024   Subjective:  Pain (Right shoulder. Patient also has a sharp pain that runs down the back of his leg when he lays down. )   Discussed the use of AI scribe software for clinical note transcription with the patient, who gave verbal consent to proceed.  History of Present Illness   Ralph Doyle is a 73 y.o. 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 weekly and folic acid  1 mg daily. He presents with persistent right shoulder pain and numbness in fingers.  He has persistent right shoulder pain that is significant when moving his shoulder in certain positions, particularly when reaching a specific point. The pain can be severe, described as 'it hurts like the dickens right there,' and causes trouble at night depending on his sleeping position. 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  He experiences numbness in his fingers, particularly those used most while playing bass guitar. This numbness occurs during performances, causing him to adjust his playing technique. The numbness is not constant and seems to be related to the position of his arm while playing.  Additionally, he mentions experiencing pain in his left leg, particularly when lying back to put in eye drops. The pain is located in the back of the leg, closer to where he bends it, and occurs when his legs are dangling off the bed. No back pain or numbness in the leg is reported.  He was diagnosed with COVID-19 a few weeks ago, which led to missing rehearsals due to isolation. He has not reported any ongoing symptoms related to COVID-19.  He is currently taking methotrexate . He has not been taking ibuprofen or other  NSAIDs regularly, mentioning he took ibuprofen about a month ago.       Previous HPI 12/18/2023 Ralph Doyle is a 73 y.o. 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 weekly and folic acid  1 mg daily.     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 This numbness began a few days ago in his right hand during a rehearsal and is not associated with pain, but feels 'weird' and affects his ability to play. He has not played to such a long duration in about six months prior to this incident. He denies any recent viral illnesses.   He reports shoulder pain that began after tweaking his shoulder. The pain is localized and occurs when he raises his arm, particularly during certain movements. He describes the pain as occurring in a specific area and recalls previous issues with his rotator cuff.   He has been active, working in the yard, and has not experienced any significant issues with his knees, which he has been exercising.   He takes methotrexate  for rheumatoid arthritis and reports no issues with this medication. Before starting methotrexate , he was 'kind of shut down' but has improved since its initiation.      Previous HPI 08/10/2023 Ralph Doyle is a 73 y.o. 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 weekly and folic acid  1 mg daily.     He has been experiencing knee pain over the last few days, which he attributes to increased walking. The pain is located in the front of the knee and occurs particularly when taking certain steps, but it is not constant. He engages in [place], which he finds beneficial for balance and arthritis.   His rheumatoid arthritis has been well-controlled with methotrexate , with no significant flare-ups in his shoulders, hands, or wrists.   No recent illness or significant upper respiratory symptoms, although he has been around his great-granddaughter  who has been unwell.        Previous HPI 05/07/2023 Ralph Doyle is a 73 y.o. 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 weekly and folic acid  1 mg daily.  Since last visit he has been doing well with new new flareup.  No visible joint swelling.  Does not have any prolonged morning stiffness.  Not requiring as needed NSAIDs.  He is still taking turmeric daily.  Previous discoloration from the right wrist injection has resolved.  He was sick with upper respiratory illness resolved without any specific antibiotic or antiviral treatment.   Previous HPI 02/04/2023 Ralph Doyle is a 73 y.o. 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 weekly and folic acid  1 mg daily.  After our last visit right wrist pain did improve with steroid injection though he did not wear the wrist brace as discussed at the time.  Currently symptoms are doing well at about baseline no major flareup of increased swelling or limitations.  Does get increased hand pain with prolonged guitar playing.  He developed a area of lighter pigmentation at his wrist where we did the injection in May.   Previous HPI 11/04/2022 Ralph Doyle is a 73 y.o. 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 weekly and folic acid  1 mg daily.  Currently has increased pain in the right wrist is been ongoing for about 4 to 5 weeks.  He thinks it may be associated with looking after his grandchildren since he notices some increased pain while lifting them.  There is a small amount of associated swelling.  Otherwise has some shoulder pain more on left but not in particular exacerbation.   Previous HPI 08/06/22 Ralph Doyle is a 73 y.o. 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 weekly and folic acid  1 mg daily.  Overall doing well without significant flareups.  He takes the turmeric consistently which is beneficial and as needed takes  Tylenol  not every day and usually up to once or twice if he is having some increased hand pain or anticipates more strenuous activity than usual.   Previous HPI 05/07/22 Ralph Doyle is a 73 y.o. male here for follow up for seropositive RA on MTX 12.5 mg PO weekly and folic acid  1 mg daily.  Overall symptoms are doing pretty well.  Working with occupational therapy has been beneficial for his hand pain and range of motion.  He is ended up not really trying the patch device he mentioned at the last visit I do not see significant clinical data to really recommend 1 way or the other.   Previous HPI 02/04/2022 Ralph Doyle is a 73 y.o. 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 He has not required any additional  steroid treatments since our last visit. He had some ultrasound shockwave treatment for tendonitis symptoms with a good benefit. Physical therapy has improved shoulder pain and range of motion. He is trying a new Aeon patch device.    Previous HPI 10/30/2021 Ralph Doyle is a 73 y.o. male here for follow up for seropositive RA on MTX 12.5 mg PO weekly folic acid  1 mg daily tried tapering prednisone  and trying turmeric supplementation.  Overall symptoms are doing fairly well. He has some pain in is left middle finger hands otherwise about the same as before. He is having pain at the base of the neck more than usual, going for massages these temporarily help. Pain is somewhat worse with looking side to side.   Review of Systems  Constitutional:  Negative for fatigue.  HENT:  Negative for mouth sores and mouth dryness.   Eyes:  Positive for dryness.  Respiratory:  Negative for shortness of breath.   Cardiovascular:  Negative for chest pain and palpitations.  Gastrointestinal:  Negative for blood in stool, constipation and diarrhea.  Endocrine: Negative for increased urination.  Genitourinary:  Negative for involuntary urination.  Musculoskeletal:   Positive for joint pain, joint pain, myalgias and myalgias. Negative for gait problem, joint swelling, muscle weakness, morning stiffness and muscle tenderness.  Skin:  Positive for rash. Negative for color change, hair loss and sensitivity to sunlight.  Allergic/Immunologic: Negative for susceptible to infections.  Neurological:  Positive for dizziness. Negative for headaches.  Hematological:  Negative for swollen glands.  Psychiatric/Behavioral:  Positive for depressed mood and sleep disturbance. The patient is not nervous/anxious.     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   Obesity (BMI 30-39.9) 08/18/2023   Dry eye syndrome of both eyes 08/18/2023   Primary hypertension 05/05/2023   Other hyperlipidemia 05/05/2023   Radial styloid tenosynovitis (de quervain) 11/04/2022   Dupuytren's contracture of both hands 05/07/2022   Neck pain 10/30/2021   Positive colorectal cancer screening using Cologuard test    Polyp of transverse colon    Pain in right hand 10/12/2020   Pain of left hand 10/12/2020   Flexor tenosynovitis of finger 08/15/2020   High risk medication use 06/14/2020   Rheumatoid arthritis (HCC) 06/14/2020   Thyroid malignant neoplasm (HCC) 05/16/2020    Past Medical History:  Diagnosis Date   Cancer (HCC)    Thyroid   Hyperlipidemia    Hypertension    Rheumatoid arthritis (HCC)     Family History  Problem Relation Age of Onset   Diabetes Mother    Hypertension Mother    Arthritis Mother    Diabetes Father    Hypertension Father    Diabetes Brother    Hypertension Brother    Hypertension Brother    Past Surgical History:  Procedure Laterality Date   COLONOSCOPY WITH PROPOFOL       COLONOSCOPY WITH PROPOFOL  N/A 10/08/2021   Procedure: COLONOSCOPY WITH PROPOFOL ;  Surgeon: Jinny Carmine, MD;  Location: [facility];  Service: Endoscopy;  Laterality: N/A;   MANDIBLE SURGERY     THYROIDECTOMY Bilateral 05/16/2020   Procedure: TOTAL THYROIDECTOMY;  Surgeon: Blair Mt, MD;  Location: [facility];  Service: ENT;  Laterality: Bilateral;   Social History   Social  History Narrative   Not on file   Immunization History  Administered Date(s) Administered   PFIZER(Purple Top)SARS-COV-2 Vaccination 08/25/2019, 09/20/2019   Tdap 04/03/2023     Objective: Vital Signs: BP 115/65   Pulse 61   Temp 97.7 F (36.5 C)   Resp 16   Ht 5' 8 (1.727 m)   Wt 194 lb (88 kg)   BMI 29.50 kg/m    Physical Exam Eyes:     Conjunctiva/sclera: Conjunctivae normal.  Cardiovascular:     Rate and Rhythm: Normal rate and regular rhythm.  Pulmonary:     Effort: Pulmonary effort is normal.     Breath sounds: Normal breath sounds.  Lymphadenopathy:     Cervical: No cervical adenopathy.  Skin:    General: Skin is warm and dry.  Neurological:     Mental Status: He is alert.  Psychiatric:        Mood and Affect: Mood normal.     Musculoskeletal Exam:  Right shoulder pain with abduction movement worse resisted, some crepitus rotating while raised, no palpable swelling Elbows full ROM no tenderness or swelling Wrists full ROM no tenderness or swelling Fingers slightly restricted extension ROM, palmar contracture thickening proximal to 4th/5th fingers, no palpable synovitis Knees full ROM no tenderness or swelling, bilateral patellofemoral crepitus  Investigation: No additional findings.  Imaging: No results found.  Recent Labs: Lab Results  Component Value Date   WBC 9.4 03/22/2024   HGB 15.1 03/22/2024   PLT 283 03/22/2024   NA 143 03/22/2024   K 4.7 03/22/2024   CL 102 03/22/2024   CO2 32 03/22/2024   GLUCOSE 84 03/22/2024   BUN 17 03/22/2024   CREATININE 1.32 (H)  03/22/2024   BILITOT 0.7 03/22/2024   ALKPHOS 70 04/07/2023   AST 14 03/22/2024   ALT 12 03/22/2024   PROT 6.5 03/22/2024   ALBUMIN 4.3 04/07/2023   CALCIUM  10.1 03/22/2024   GFRAA 69 11/06/2020    Speciality Comments: No specialty comments available.  Procedures:  No procedures performed Allergies: Patient has no known allergies.   Assessment / Plan:     Visit Diagnoses: Rheumatoid arthritis involving multiple sites with positive rheumatoid factor (HCC) - Plan: Sedimentation rate Inflammation appears well controlled overall, right shoulder pain seeming more related to use and probably existing tendinopathy. If labs and symptoms doing well could revisit trial of medication tapering or cessation in follow up. - Checking sed rate for disease activity monitoring - Continue methotrexate  12.5 mg PO weekly folic acid  1 mg daily  Rotator cuff dysfunction, right Chronic pain due to partial thickness tear. Discussed joint mobility, rotator cuff stabilization, and slow tendon healing. Current symptoms do not require aggressive treatment. - Consider Theracane or massage gun for self-massage. - Consider ibuprofen at night for a week if pain worsens.  High risk medication use - Plan: CBC with Differential/Platelet, Comprehensive metabolic panel with GFR Tolerating medication without any specific complaints. No serious interval infections. -Checking CBC and CMP for medication monitoring on long term continued methotrexate   Hand pain and numbness Intermittent numbness possibly related to bass guitar playing. No pinched nerve or carpal tunnel syndrome. Possible shoulder impingement. - Consider hand stretches to alleviate symptoms.  Left posterior thigh pain Intermittent pain with unclear cause. No back pain or numbness. Good range of motion. Not related to hamstring flexibility or mechanical issue. Not reproducible in clinic today and overall strange pattern, monitor for now.        Orders: Orders Placed This  Encounter  Procedures   Sedimentation rate   CBC with Differential/Platelet   Comprehensive metabolic panel with GFR   No orders of the defined types were placed in this encounter.    Follow-Up Instructions: Return in about 3 months (around 06/21/2024) for RA on MTX f/u 3mos.   Lonni LELON Ester, MD  Note - This record has been created using [employer].  Chart creation errors have been sought, but may not always  have been located. Such creation errors do not reflect on  the standard of medical care.

## 2024-03-23 LAB — CBC WITH DIFFERENTIAL/PLATELET
Absolute Lymphocytes: 1899 {cells}/uL (ref 850–3900)
Absolute Monocytes: 780 {cells}/uL (ref 200–950)
Basophils Absolute: 47 {cells}/uL (ref 0–200)
Basophils Relative: 0.5 %
Eosinophils Absolute: 38 {cells}/uL (ref 15–500)
Eosinophils Relative: 0.4 %
HCT: 46.3 % (ref 38.5–50.0)
Hemoglobin: 15.1 g/dL (ref 13.2–17.1)
MCH: 32.5 pg (ref 27.0–33.0)
MCHC: 32.6 g/dL (ref 32.0–36.0)
MCV: 99.8 fL (ref 80.0–100.0)
MPV: 9.4 fL (ref 7.5–12.5)
Monocytes Relative: 8.3 %
Neutro Abs: 6636 {cells}/uL (ref 1500–7800)
Neutrophils Relative %: 70.6 %
Platelets: 283 Thousand/uL (ref 140–400)
RBC: 4.64 Million/uL (ref 4.20–5.80)
RDW: 14.1 % (ref 11.0–15.0)
Total Lymphocyte: 20.2 %
WBC: 9.4 Thousand/uL (ref 3.8–10.8)

## 2024-03-23 LAB — COMPREHENSIVE METABOLIC PANEL WITH GFR
AG Ratio: 2.1 (calc) (ref 1.0–2.5)
ALT: 12 U/L (ref 9–46)
AST: 14 U/L (ref 10–35)
Albumin: 4.4 g/dL (ref 3.6–5.1)
Alkaline phosphatase (APISO): 55 U/L (ref 35–144)
BUN/Creatinine Ratio: 13 (calc) (ref 6–22)
BUN: 17 mg/dL (ref 7–25)
CO2: 32 mmol/L (ref 20–32)
Calcium: 10.1 mg/dL (ref 8.6–10.3)
Chloride: 102 mmol/L (ref 98–110)
Creat: 1.32 mg/dL — ABNORMAL HIGH (ref 0.70–1.28)
Globulin: 2.1 g/dL (ref 1.9–3.7)
Glucose, Bld: 84 mg/dL (ref 65–99)
Potassium: 4.7 mmol/L (ref 3.5–5.3)
Sodium: 143 mmol/L (ref 135–146)
Total Bilirubin: 0.7 mg/dL (ref 0.2–1.2)
Total Protein: 6.5 g/dL (ref 6.1–8.1)
eGFR: 58 mL/min/1.73m2 — ABNORMAL LOW (ref >=60)

## 2024-03-23 LAB — SEDIMENTATION RATE: Sed Rate: 2 mm/h (ref 0–20)

## 2024-03-24 ENCOUNTER — Ambulatory Visit: Admitting: Internal Medicine

## 2024-03-24 DIAGNOSIS — M720 Palmar fascial fibromatosis [Dupuytren]: Secondary | ICD-10-CM

## 2024-03-24 DIAGNOSIS — Z79899 Other long term (current) drug therapy: Secondary | ICD-10-CM

## 2024-03-24 DIAGNOSIS — M0579 Rheumatoid arthritis with rheumatoid factor of multiple sites without organ or systems involvement: Secondary | ICD-10-CM

## 2024-04-15 ENCOUNTER — Ambulatory Visit: Admitting: Physician Assistant

## 2024-04-15 VITALS — BP 126/78 | HR 58 | Resp 14 | Ht 68.0 in | Wt 197.0 lb

## 2024-04-15 DIAGNOSIS — M069 Rheumatoid arthritis, unspecified: Secondary | ICD-10-CM

## 2024-04-15 DIAGNOSIS — I10 Essential (primary) hypertension: Secondary | ICD-10-CM | POA: Diagnosis not present

## 2024-04-15 DIAGNOSIS — N1831 Chronic kidney disease, stage 3a: Secondary | ICD-10-CM | POA: Diagnosis not present

## 2024-04-15 DIAGNOSIS — E7849 Other hyperlipidemia: Secondary | ICD-10-CM

## 2024-04-15 DIAGNOSIS — E890 Postprocedural hypothyroidism: Secondary | ICD-10-CM | POA: Diagnosis not present

## 2024-04-15 DIAGNOSIS — Z79899 Other long term (current) drug therapy: Secondary | ICD-10-CM

## 2024-04-15 NOTE — Progress Notes (Signed)
 " Established patient visit  Patient: Ralph Doyle   DOB: [birth year]   73 y.o. Male  MRN: [id number] Visit Date: 04/15/2024  Today's healthcare provider: Jolynn Spencer, PA-C   Chief Complaint  Patient presents with   Medical Management of Chronic Issues    3 month f/u Taking medicine as directed.  Would like to discuss vaccines that are due   Subjective     HPI     Medical Management of Chronic Issues    Additional comments: 3 month f/u Taking medicine as directed.  Would like to discuss vaccines that are due      Last edited by Wilfred Hargis RAMAN, CMA on 04/15/2024  8:53 AM.       Discussed the use of AI scribe software for clinical note transcription with the patient, who gave verbal consent to proceed.  History of Present Illness Ralph Doyle is a 73 year old male who presents to discuss vaccination options and chronic medical conditions.  He is considering a flu shot after not receiving one for over thirty years. He is frequently around small children and is concerned about the upcoming flu and COVID season. He is trying to schedule the vaccination around his work and potential side effects.  He takes methotrexate  12.5 mg for rheumatoid arthritis and experiences manageable joint pain. He consulted a rheumatologist in September 2023 and had blood work done.  In early September 2025, he experienced symptoms consistent with COVID-19 and visited the emergency room. He described the illness as an ongoing cold and wore a mask to prevent spreading the virus.       01/14/2024   10:42 AM 09/28/2023    8:36 AM 09/14/2023   10:00 AM  Depression screen PHQ 2/9  Decreased Interest 0 2 1  Down, Depressed, Hopeless 1 2 2   PHQ - 2 Score 1 4 3   Altered sleeping 0 2 3  Tired, decreased energy 1 1 1   Change in appetite 0 1 1  Feeling bad or failure about yourself  1 2 1   Trouble concentrating 0 1 0  Moving slowly or fidgety/restless 0 0 0  Suicidal thoughts 0 0 0  PHQ-9 Score  3 11 9   Difficult doing work/chores  Not difficult at all Not difficult at all      01/14/2024   10:42 AM 09/28/2023    8:37 AM 09/14/2023   10:01 AM 08/18/2023    9:06 AM  GAD 7 : Generalized Anxiety Score  Nervous, Anxious, on Edge 1 0 0 0  Control/stop worrying 0 0 1 0  Worry too much - different things 1 1 1 1   Trouble relaxing 1 0 1 1  Restless 0 1 0 1  Easily annoyed or irritable 0 0 0 0  Afraid - awful might happen 1 0 1 1  Total GAD 7 Score 4 2 4 4   Anxiety Difficulty Somewhat difficult Not difficult at all Not difficult at all Somewhat difficult    Medications: Outpatient Medications Prior to Visit  Medication Sig   amLODipine  (NORVASC ) 5 MG tablet TAKE 1 TABLET BY MOUTH DAILY WITH LIPITOR   atorvastatin  (LIPITOR) 20 MG tablet Take 1 tablet (20 mg total) by mouth daily.   folic acid  (FOLVITE ) 1 MG tablet TAKE 1 TABLET BY MOUTH DAILY   hydrochlorothiazide  (MICROZIDE ) 12.5 MG capsule TAKE 1 CAPSULE BY MOUTH DAILY   levothyroxine (SYNTHROID) 100 MCG tablet TAKE 1 TABLET BY MOUTH DAILY   methotrexate  (RHEUMATREX) 2.5 MG tablet TAKE 5  TABLETS (12.5 MG TOTAL) BY MOUTH ONCE WEEKLY. CAUTION: CHEMOTHERAPY. PROTECT FROM LIGHT.   Turmeric 400 MG CAPS Take as needed per directions   valsartan  (DIOVAN ) 40 MG tablet Take 1 tablet (40 mg total) by mouth daily.   [DISCONTINUED] augmented betamethasone dipropionate (DIPROLENE-AF) 0.05 % ointment Apply 1 application topically daily as needed for rash. (Patient not taking: Reported on 03/22/2024)   [DISCONTINUED] bacitracin  ointment Apply 1 application topically every 8 (eight) hours. (Patient not taking: Reported on 03/22/2024)   [DISCONTINUED] levothyroxine (SYNTHROID) 112 MCG tablet Take 112 mcg by mouth daily. (Patient not taking: Reported on 03/22/2024)   [DISCONTINUED] OVER THE COUNTER MEDICATION Apply 1 application topically daily as needed (pain). CBD cream   [DISCONTINUED] Polyethyl Glycol-Propyl Glycol (SYSTANE ULTRA OP) Place 1 drop  into both eyes daily as needed (dry eyes). (Patient not taking: Reported on 03/22/2024)   No facility-administered medications prior to visit.    Review of Systems All negative Except see HPI       Objective    BP 126/78   Pulse (!) 58   Resp 14   Ht 5' 8 (1.727 m)   Wt 197 lb (89.4 kg)   SpO2 100%   BMI 29.95 kg/m     Physical Exam Vitals reviewed.  Constitutional:      General: He is not in acute distress.    Appearance: Normal appearance. He is not diaphoretic.  HENT:     Head: Normocephalic and atraumatic.  Eyes:     General: No scleral icterus.    Conjunctiva/sclera: Conjunctivae normal.  Cardiovascular:     Rate and Rhythm: Normal rate and regular rhythm.     Pulses: Normal pulses.     Heart sounds: Normal heart sounds. No murmur heard. Pulmonary:     Effort: Pulmonary effort is normal. No respiratory distress.     Breath sounds: Normal breath sounds. No wheezing or rhonchi.  Musculoskeletal:     Cervical back: Neck supple.     Right lower leg: No edema.     Left lower leg: No edema.  Lymphadenopathy:     Cervical: No cervical adenopathy.  Skin:    General: Skin is warm and dry.     Findings: No rash.  Neurological:     Mental Status: He is alert and oriented to person, place, and time. Mental status is at baseline.  Psychiatric:        Mood and Affect: Mood normal.        Behavior: Behavior normal.      Results for orders placed or performed in visit on 04/15/24  CBC with Differential/Platelet  Result Value Ref Range   WBC 7.7 3.4 - 10.8 x10E3/uL   RBC 4.85 4.14 - 5.80 x10E6/uL   Hemoglobin 15.7 13.0 - 17.7 g/dL   Hematocrit 51.6 62.4 - 51.0 %   MCV 100 (H) 79 - 97 fL   MCH 32.4 26.6 - 33.0 pg   MCHC 32.5 31.5 - 35.7 g/dL   RDW 85.4 88.3 - 84.5 %   Platelets 295 150 - 450 x10E3/uL   Neutrophils 68 Not Estab. %   Lymphs 21 Not Estab. %   Monocytes 9 Not Estab. %   Eos 1 Not Estab. %   Basos 1 Not Estab. %   Neutrophils Absolute 5.2 1.4  - 7.0 x10E3/uL   Lymphocytes Absolute 1.6 0.7 - 3.1 x10E3/uL   Monocytes Absolute 0.7 0.1 - 0.9 x10E3/uL   EOS (ABSOLUTE) 0.1 0.0 - 0.4 x10E3/uL  Basophils Absolute 0.1 0.0 - 0.2 x10E3/uL   Immature Granulocytes 0 Not Estab. %   Immature Grans (Abs) 0.0 0.0 - 0.1 x10E3/uL  Comprehensive metabolic panel with GFR  Result Value Ref Range   Glucose 67 (L) 70 - 99 mg/dL   BUN 19 8 - 27 mg/dL   Creatinine, Ser 8.57 (H) 0.76 - 1.27 mg/dL   eGFR 53 (L) >40 fO/fpw/8.26   BUN/Creatinine Ratio 13 10 - 24   Sodium 141 134 - 144 mmol/L   Potassium 4.4 3.5 - 5.2 mmol/L   Chloride 100 96 - 106 mmol/L   CO2 27 20 - 29 mmol/L   Calcium  10.3 (H) 8.6 - 10.2 mg/dL   Total Protein 6.8 6.0 - 8.5 g/dL   Albumin 4.5 3.8 - 4.8 g/dL   Globulin, Total 2.3 1.5 - 4.5 g/dL   Bilirubin Total 0.6 0.0 - 1.2 mg/dL   Alkaline Phosphatase 72 47 - 123 IU/L   AST 20 0 - 40 IU/L   ALT 16 0 - 44 IU/L  TSH  Result Value Ref Range   TSH 2.120 0.450 - 4.500 uIU/mL  T4, free  Result Value Ref Range   Free T4 1.43 0.82 - 1.77 ng/dL  Lipid panel  Result Value Ref Range   Cholesterol, Total 185 100 - 199 mg/dL   Triglycerides 887 0 - 149 mg/dL   HDL 47 >60 mg/dL   VLDL Cholesterol Cal 20 5 - 40 mg/dL   LDL Chol Calc (NIH) 881 (H) 0 - 99 mg/dL   Chol/HDL Ratio 3.9 0.0 - 5.0 ratio         Assessment & Plan Rheumatoid arthritis Hight risk medication use Rheumatoid arthritis with joint pain, managed with methotrexate  12.5 mg. - Continue methotrexate  12.5 mg. - Schedule periodic blood work to monitor long-term effects of methotrexate . Monitor for complications or side effects. CBC, CMP ordered Managed by rheumatology  General Health Maintenance Discussed flu vaccination due to frequent exposure to small children and recent COVID-19 infection. Addressed concerns about side effects and timing. - Schedule flu vaccination for a Monday as a nurse visit.   Primary hypertension (Primary) Chronic and  stable Continue amlodipine  5, hydrochlorothiazide  12.5, valsartan  40 Continue low sodium diet and regular exercise - CBC with Differential/Platelet - Comprehensive metabolic panel with GFR - TSH - T4, free - Lipid panel Will monitor  Postoperative hypothyroidism Chronic and stable Continue levothyroxine 100mcg - CBC with Differential/Platelet - Comprehensive metabolic panel with GFR - TSH - T4, free - Lipid panel Will follow-up   Other hyperlipidemia Chronic and stable Continue cholesterol med 20mg  Continue lifestyle modifications - CBC with Differential/Platelet - Comprehensive metabolic panel with GFR - TSH - T4, free - Lipid panel Will reassess after  receiving lab results Will FU  Stage 3a chronic kidney disease (HCC) Chronic and unstable. Continue adequate water/liquid intake Continue to avoid nsaids Will follow-up   Orders Placed This Encounter  Procedures   CBC with Differential/Platelet   Comprehensive metabolic panel with GFR    Has the patient fasted?:   Yes   TSH   T4, free   Lipid panel    Has the patient fasted?:   Yes    No follow-ups on file.   The patient was advised to call back or seek an in-person evaluation if the symptoms worsen or if the condition fails to improve as anticipated.  I discussed the assessment and treatment plan with the patient. The patient was provided an opportunity to  ask questions and all were answered. The patient agreed with the plan and demonstrated an understanding of the instructions.  I, Arael Piccione, PA-C have reviewed all documentation for this visit. The documentation on 04/15/2024  for the exam, diagnosis, procedures, and orders are all accurate and complete.  Jolynn Spencer, [facility], MMS [facility] [phone] (phone) [phone] (fax)  [facility] "

## 2024-04-16 LAB — T4, FREE: Free T4: 1.43 ng/dL (ref 0.82–1.77)

## 2024-04-16 LAB — CBC WITH DIFFERENTIAL/PLATELET
Basophils Absolute: 0.1 x10E3/uL (ref 0.0–0.2)
Basos: 1 %
EOS (ABSOLUTE): 0.1 x10E3/uL (ref 0.0–0.4)
Eos: 1 %
Hematocrit: 48.3 % (ref 37.5–51.0)
Hemoglobin: 15.7 g/dL (ref 13.0–17.7)
Immature Grans (Abs): 0 x10E3/uL (ref 0.0–0.1)
Immature Granulocytes: 0 %
Lymphocytes Absolute: 1.6 x10E3/uL (ref 0.7–3.1)
Lymphs: 21 %
MCH: 32.4 pg (ref 26.6–33.0)
MCHC: 32.5 g/dL (ref 31.5–35.7)
MCV: 100 fL — ABNORMAL HIGH (ref 79–97)
Monocytes Absolute: 0.7 x10E3/uL (ref 0.1–0.9)
Monocytes: 9 %
Neutrophils Absolute: 5.2 x10E3/uL (ref 1.4–7.0)
Neutrophils: 68 %
Platelets: 295 x10E3/uL (ref 150–450)
RBC: 4.85 x10E6/uL (ref 4.14–5.80)
RDW: 14.5 % (ref 11.6–15.4)
WBC: 7.7 x10E3/uL (ref 3.4–10.8)

## 2024-04-16 LAB — COMPREHENSIVE METABOLIC PANEL WITH GFR
ALT: 16 IU/L (ref 0–44)
AST: 20 IU/L (ref 0–40)
Albumin: 4.5 g/dL (ref 3.8–4.8)
Alkaline Phosphatase: 72 IU/L (ref 47–123)
BUN/Creatinine Ratio: 13 (ref 10–24)
BUN: 19 mg/dL (ref 8–27)
Bilirubin Total: 0.6 mg/dL (ref 0.0–1.2)
CO2: 27 mmol/L (ref 20–29)
Calcium: 10.3 mg/dL — ABNORMAL HIGH (ref 8.6–10.2)
Chloride: 100 mmol/L (ref 96–106)
Creatinine, Ser: 1.42 mg/dL — ABNORMAL HIGH (ref 0.76–1.27)
Globulin, Total: 2.3 g/dL (ref 1.5–4.5)
Glucose: 67 mg/dL — ABNORMAL LOW (ref 70–99)
Potassium: 4.4 mmol/L (ref 3.5–5.2)
Sodium: 141 mmol/L (ref 134–144)
Total Protein: 6.8 g/dL (ref 6.0–8.5)
eGFR: 53 mL/min/1.73 — ABNORMAL LOW (ref >59)

## 2024-04-16 LAB — TSH: TSH: 2.12 u[IU]/mL (ref 0.450–4.500)

## 2024-04-16 LAB — LIPID PANEL
Chol/HDL Ratio: 3.9 ratio (ref 0.0–5.0)
Cholesterol, Total: 185 mg/dL (ref 100–199)
HDL: 47 mg/dL (ref >39)
LDL Chol Calc (NIH): 118 mg/dL — ABNORMAL HIGH (ref 0–99)
Triglycerides: 112 mg/dL (ref 0–149)
VLDL Cholesterol Cal: 20 mg/dL (ref 5–40)

## 2024-04-18 ENCOUNTER — Ambulatory Visit: Payer: Self-pay | Admitting: Physician Assistant

## 2024-04-18 ENCOUNTER — Other Ambulatory Visit: Payer: Self-pay | Admitting: Physician Assistant

## 2024-04-20 ENCOUNTER — Telehealth: Payer: Self-pay

## 2024-04-20 NOTE — Telephone Encounter (Signed)
 Copied from CRM [id number]. Topic: Clinical - Lab/Test Results >> Apr 20, 2024 12:25 PM Fonda T wrote: Reason for CRM: Patient is calling, requesting a return all to discuss lab results, as he has additional questions and needs clarity on providers message via mychart message.  Patient can be reached at [phone], patient is aware of same day return call back.

## 2024-04-25 ENCOUNTER — Other Ambulatory Visit: Payer: Self-pay

## 2024-04-25 MED ORDER — ATORVASTATIN CALCIUM 40 MG PO TABS: 40.0000 mg | ORAL_TABLET | Freq: Every day | ORAL | 3 refills | Status: AC

## 2024-04-25 NOTE — Telephone Encounter (Signed)
 Spoke with stated he was only needing refills on his atorvastatin . Medication has been sent in for 40mg  since lab results was advised to increased dosage.

## 2024-05-04 ENCOUNTER — Other Ambulatory Visit: Payer: Self-pay | Admitting: Internal Medicine

## 2024-05-04 ENCOUNTER — Other Ambulatory Visit: Payer: Self-pay | Admitting: Physician Assistant

## 2024-05-04 ENCOUNTER — Ambulatory Visit: Payer: Self-pay | Admitting: Internal Medicine

## 2024-05-04 DIAGNOSIS — I10 Essential (primary) hypertension: Secondary | ICD-10-CM

## 2024-05-04 DIAGNOSIS — M0579 Rheumatoid arthritis with rheumatoid factor of multiple sites without organ or systems involvement: Secondary | ICD-10-CM

## 2024-05-04 NOTE — Telephone Encounter (Signed)
 Last Fill: 01/29/2024  Labs: 04/15/2024 Glucose 67 Creatinine 1.42 eGFR 53 Calcium  10.3 MCV 100 Rest of CBC WNL  Next Visit: 06/15/2024  Last Visit: 03/22/2024  DX:  Rheumatoid arthritis involving multiple sites with positive rheumatoid factor   Current Dose per office note 03/22/2024: methotrexate  12.5 mg PO weekly   Okay to refill Methotrexate ?

## 2024-05-11 ENCOUNTER — Other Ambulatory Visit: Payer: Self-pay | Admitting: Physician Assistant

## 2024-05-11 DIAGNOSIS — E890 Postprocedural hypothyroidism: Secondary | ICD-10-CM

## 2024-05-13 ENCOUNTER — Other Ambulatory Visit: Payer: Self-pay | Admitting: Physician Assistant

## 2024-05-13 DIAGNOSIS — I10 Essential (primary) hypertension: Secondary | ICD-10-CM

## 2024-05-23 ENCOUNTER — Other Ambulatory Visit: Payer: Self-pay | Admitting: Internal Medicine

## 2024-05-23 DIAGNOSIS — M0579 Rheumatoid arthritis with rheumatoid factor of multiple sites without organ or systems involvement: Secondary | ICD-10-CM

## 2024-05-23 NOTE — Telephone Encounter (Signed)
 Last Fill: 05/25/2024  Next Visit: 06/15/2024  Last Visit: 03/22/2024  Dx: Rheumatoid arthritis involving multiple sites with positive rheumatoid factor   Current Dose per office note on 03/22/2024: folic acid  1 mg daily   Okay to refill Folic Acid ?

## 2024-06-06 NOTE — Progress Notes (Signed)
 "  Office Visit Note  Patient: Ralph Doyle             Date of Birth: February 01, 1951           MRN: [id number]             PCP: Ostwalt, Janna, PA-C Referring: Ostwalt, Janna, PA-C Visit Date: 06/15/2024   Subjective:  Joint Pain (Right shoulder and left middle finger getting stuck when bending or making a fist. Pain in the muscle of the left bottom of the thigh)   Discussed the use of AI scribe software for clinical note transcription with the patient, who gave verbal consent to proceed.  History of Present Illness   Ralph Doyle is a 73 y.o. 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 weekly and folic acid  1 mg daily.   He has been experiencing persistent shoulder pain for a long duration, primarily in the shoulder area, but with pain extending to the wrist when raising his arm. The pain worsens with arm elevation. He recalls previous steroid injections in his hand but not in his shoulder. He reports that he has not been doing much physical activity recently due to cold weather.  He describes pain and locking in his finger when making a fist, present for several weeks to a couple of months. The pain varies in intensity, with increased severity over the last few days. 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 The locking does not significantly interfere with daily activities but causes discomfort.  Additionally, he experiences pain in the back of his thigh, specifically on the underside of the leg, exacerbated by certain positions like lying back on a bed. No radiation of this pain down the leg.  He continues to take methotrexate  without issues and denies recent illness or joint swelling. He mentions having dry skin on his hands, which he states is a chronic issue regardless of lotion use.      Previous HPI 03/22/2024 Ralph Doyle is a 73 y.o. 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 weekly and folic acid   1 mg daily. He presents with persistent right shoulder pain and numbness in fingers.   He has persistent right shoulder pain that is significant when moving his shoulder in certain positions, particularly when reaching a specific point. The pain can be severe, described as 'it hurts like the dickens right there,' and causes trouble at night depending on his sleeping position. 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   He experiences numbness in his fingers, particularly those used most while playing bass guitar. This numbness occurs during performances, causing him to adjust his playing technique. The numbness is not constant and seems to be related to the position of his arm while playing.   Additionally, he mentions experiencing pain in his left leg, particularly when lying back to put in eye drops. The pain is located in the back of the leg, closer to where he bends it, and occurs when his legs are dangling off the bed. No back pain or numbness in the leg is reported.   He was diagnosed with COVID-19 a few weeks ago, which led to missing rehearsals due to isolation. He has not reported any ongoing symptoms related to COVID-19.   He is currently taking methotrexate . He has not been taking ibuprofen or other NSAIDs regularly,  mentioning he took ibuprofen about a month ago.         Previous HPI 12/18/2023 Ralph Doyle is a 73 y.o. 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 weekly and folic acid  1 mg daily.     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 This numbness began a few days ago in his right hand during a rehearsal and is not associated with pain, but feels 'weird' and affects his ability to play. He has not played to such a long duration in about six months prior to this incident. He denies any recent viral  illnesses.   He reports shoulder pain that began after tweaking his shoulder. The pain is localized and occurs when he raises his arm, particularly during certain movements. He describes the pain as occurring in a specific area and recalls previous issues with his rotator cuff.   He has been active, working in the yard, and has not experienced any significant issues with his knees, which he has been exercising.   He takes methotrexate  for rheumatoid arthritis and reports no issues with this medication. Before starting methotrexate , he was 'kind of shut down' but has improved since its initiation.      Previous HPI 08/10/2023 Ralph Doyle is a 73 y.o. 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 weekly and folic acid  1 mg daily.     He has been experiencing knee pain over the last few days, which he attributes to increased walking. The pain is located in the front of the knee and occurs particularly when taking certain steps, but it is not constant. He engages in [place], which he finds beneficial for balance and arthritis.   His rheumatoid arthritis has been well-controlled with methotrexate , with no significant flare-ups in his shoulders, hands, or wrists.   No recent illness or significant upper respiratory symptoms, although he has been around his great-granddaughter who has been unwell.        Previous HPI 05/07/2023 Ralph Doyle is a 73 y.o. 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 weekly and folic acid  1 mg daily.  Since last visit he has been doing well with new new flareup.  No visible joint swelling.  Does not have any prolonged morning stiffness.  Not requiring as needed NSAIDs.  He is still taking turmeric daily.  Previous discoloration from the right wrist injection has resolved.  He was sick with upper respiratory illness resolved without any specific antibiotic or antiviral treatment.   Previous  HPI 02/04/2023 Ralph Doyle is a 73 y.o. 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 weekly and folic acid  1 mg daily.  After our last visit right wrist pain did improve with steroid injection though he did not wear the wrist brace as discussed at the time.  Currently symptoms are doing well at about baseline no major flareup of increased swelling or limitations.  Does get increased hand pain with prolonged guitar playing.  He developed a area of lighter pigmentation at his wrist where we did the injection in May.   Previous HPI 11/04/2022 Benino Korinek is a 73 y.o. 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 weekly and folic acid  1 mg daily.  Currently has increased pain in the right wrist is been ongoing for about 4 to 5  weeks.  He thinks it may be associated with looking after his grandchildren since he notices some increased pain while lifting them.  There is a small amount of associated swelling.  Otherwise has some shoulder pain more on left but not in particular exacerbation.   Previous HPI 08/06/22 Gjon Letarte is a 73 y.o. 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 weekly and folic acid  1 mg daily.  Overall doing well without significant flareups.  He takes the turmeric consistently which is beneficial and as needed takes Tylenol  not every day and usually up to once or twice if he is having some increased hand pain or anticipates more strenuous activity than usual.   Previous HPI 05/07/22 Jazion Atteberry is a 74 y.o. male here for follow up for seropositive RA on MTX 12.5 mg PO weekly and folic acid  1 mg daily.  Overall symptoms are doing pretty well.  Working with occupational therapy has been beneficial for his hand pain and range of motion.  He is ended up not really trying the patch device he mentioned at the last visit I do not see significant clinical data to really recommend 1 way or the other.    Previous HPI 02/04/2022 Minoru Chap is a 73 y.o. 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 He has not required any additional steroid treatments since our last visit. He had some ultrasound shockwave treatment for tendonitis symptoms with a good benefit. Physical therapy has improved shoulder pain and range of motion. He is trying a new Aeon patch device.    Previous HPI 10/30/2021 Rorey Bisson is a 73 y.o. male here for follow up for seropositive RA on MTX 12.5 mg PO weekly folic acid  1 mg daily tried tapering prednisone  and trying turmeric supplementation.  Overall symptoms are doing fairly well. He has some pain in is left middle finger hands otherwise about the same as before. He is having pain at the base of the neck more than usual, going for massages these temporarily help. Pain is somewhat worse with looking side to side.     Review of Systems  Constitutional:  Negative for fatigue.  HENT:  Positive for mouth dryness. Negative for mouth sores.   Eyes:  Positive for dryness.  Respiratory:  Negative for shortness of breath.   Cardiovascular:  Negative for chest pain and palpitations.  Gastrointestinal:  Negative for blood in stool, constipation and diarrhea.  Endocrine: Negative for increased urination.  Genitourinary:  Negative for involuntary urination.  Musculoskeletal:  Positive for joint pain, joint pain, myalgias, morning stiffness, muscle tenderness and myalgias. Negative for gait problem, joint swelling and muscle weakness.  Skin:  Positive for color change and rash. Negative for hair loss and sensitivity to sunlight.  Allergic/Immunologic: Negative for susceptible to infections.  Neurological:  Negative for dizziness and headaches.  Hematological:  Negative for swollen glands.  Psychiatric/Behavioral:  Positive for depressed mood and sleep disturbance. The patient is nervous/anxious.     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   Obesity (BMI 30-39.9) 08/18/2023   Dry eye syndrome of both eyes 08/18/2023   Primary hypertension  05/05/2023   Other hyperlipidemia 05/05/2023   Radial styloid tenosynovitis (de quervain) 11/04/2022   Dupuytren's contracture of both hands 05/07/2022   Neck pain 10/30/2021   Positive colorectal cancer screening using Cologuard test    Polyp of transverse colon    Pain in right hand 10/12/2020   Pain of left hand 10/12/2020   Flexor tenosynovitis of finger 08/15/2020   High risk medication use 06/14/2020   Rheumatoid arthritis (HCC) 06/14/2020   Thyroid malignant neoplasm (HCC) 05/16/2020    Past Medical History:  Diagnosis Date   Cancer (HCC)    Thyroid   Hyperlipidemia    Hypertension    Rheumatoid arthritis (HCC)     Family History  Problem Relation Age of Onset   Diabetes Mother    Hypertension Mother    Arthritis Mother    Diabetes Father    Hypertension Father    Diabetes Brother    Hypertension Brother    Hypertension Brother    Past Surgical History:  Procedure Laterality Date   COLONOSCOPY WITH PROPOFOL      COLONOSCOPY WITH PROPOFOL  N/A 10/08/2021   Procedure: COLONOSCOPY WITH PROPOFOL ;  Surgeon: Jinny Carmine, MD;  Location: [facility] END[facility]COPY;  Service: Endoscopy;  Laterality: N/A;   MANDIBLE SURGERY     THYROIDECTOMY Bilateral 05/16/2020   Procedure: TOTAL THYROIDECTOMY;  Surgeon: Blair Mt, MD;  Location: [facility] ORS;  Service: ENT;  Laterality: Bilateral;   Social History   Social History Narrative   Not on file   Immunization History  Administered Date(s) Administered   PFIZER(Purple Top)SARS-COV-2 Vaccination 08/25/2019, 09/20/2019    Tdap 04/03/2023     Objective: Vital Signs: BP 116/64   Pulse (!) 59   Temp 97.8 F (36.6 C)   Resp 16   Ht 5' 8 (1.727 m)   Wt 200 lb 12.8 oz (91.1 kg)   BMI 30.53 kg/m    Physical Exam   Musculoskeletal Exam:  Right shoulder pain with abduction movement worse resisted, some crepitus rotating while raised, lateral tenderness to pressure Elbows full ROM no tenderness or swelling Wrists full ROM no tenderness or swelling Fingers slightly restricted extension ROM, palmar contracture thickening proximal to 4th/5th fingers, no palpable synovitis, triggering reproducible on left third finger without associated synovitis Knees full ROM no tenderness or swelling, bilateral patellofemoral crepitus   Investigation: No additional findings.  Imaging: No results found.  Recent Labs: Lab Results  Component Value Date   WBC 9.6 06/15/2024   HGB 15.6 06/15/2024   PLT 297 06/15/2024   NA 139 06/15/2024   K 4.3 06/15/2024   CL 100 06/15/2024   CO2 32 06/15/2024   GLUCOSE 75 06/15/2024   BUN 16 06/15/2024   CREATININE 1.39 (H) 06/15/2024   BILITOT 0.6 06/15/2024   ALKPHOS 72 04/15/2024   AST 16 06/15/2024   ALT 16 06/15/2024   PROT 6.4 06/15/2024   ALBUMIN 4.5 04/15/2024   CALCIUM  10.2 06/15/2024   GFRAA 69 11/06/2020    Speciality Comments: No specialty comments available.  Procedures:  No procedures performed Allergies: Patient has no known allergies.   Assessment / Plan:     Visit Diagnoses: Rheumatoid arthritis involving multiple sites with positive rheumatoid factor (HCC) - Plan: Sedimentation rate Chronic rheumatoid arthritis with positive rheumatoid factor. Pain present but not severe. Methotrexate  well-tolerated. - Continue methotrexate  12.5 mg PO weekly folic acid  1 mg daily  - Rechecked blood tests for sed rate - Advised against frequent NSAID use due to kidney impact.  Trigger  finger, left middle finger Trigger finger in leftmiddle finger with locking and  pain. No significant flexor tendon nodule. Likely related to palm contracture. Conservative management preferred. - Provided information on trigger finger management. - Advised stretching exercises for affected finger. - Consider use of finger splint if symptoms occur overnight. - Avoid aggressive interventions unless symptoms worsen.  Rotator cuff dysfunction, right shoulder Right shoulder pain with winged scapula. Previous steroid injections. No acute inflammation or significant changes. - Advised against frequent NSAID use due to kidney impact. - Consider referral to physical therapy if symptoms worsen.  Stage 3a chronic kidney disease High risk medication use - methotrexate  12.5 mg PO weekly folic acid  1 mg daily - Plan: CBC with Differential/Platelet, Comprehensive metabolic panel with GFR Chronic kidney disease stage 3a with kidney function in the fifties. Blood pressure well-controlled. - Rechecked blood tests to monitor kidney function and CBC. - Advised against frequent NSAID use due to kidney impact. - Continue monitoring blood pressure to prevent further kidney damage.       Orders: Orders Placed This Encounter  Procedures   Sedimentation rate   CBC with Differential/Platelet   Comprehensive metabolic panel with GFR   Meds ordered this encounter  Medications   methotrexate  (RHEUMATREX) 2.5 MG tablet    Sig: Take 5 tablets (12.5 mg total) by mouth once a week. Caution:Chemotherapy. Protect from light.    Dispense:  60 tablet    Refill:  0     Follow-Up Instructions: No follow-ups on file.   Lonni LELON Ester, MD  Note - This record has been created using [employer].  Chart creation errors have been sought, but may not always  have been located. Such creation errors do not reflect on  the standard of medical care. "

## 2024-06-15 ENCOUNTER — Encounter: Payer: Self-pay | Admitting: Internal Medicine

## 2024-06-15 ENCOUNTER — Ambulatory Visit: Attending: Internal Medicine | Admitting: Internal Medicine

## 2024-06-15 VITALS — BP 116/64 | HR 59 | Temp 97.8°F | Resp 16 | Ht 68.0 in | Wt 200.8 lb

## 2024-06-15 DIAGNOSIS — M0579 Rheumatoid arthritis with rheumatoid factor of multiple sites without organ or systems involvement: Secondary | ICD-10-CM | POA: Diagnosis present

## 2024-06-15 DIAGNOSIS — Z79899 Other long term (current) drug therapy: Secondary | ICD-10-CM | POA: Diagnosis present

## 2024-06-15 DIAGNOSIS — M67911 Unspecified disorder of synovium and tendon, right shoulder: Secondary | ICD-10-CM | POA: Diagnosis present

## 2024-06-15 DIAGNOSIS — N1831 Chronic kidney disease, stage 3a: Secondary | ICD-10-CM | POA: Insufficient documentation

## 2024-06-15 DIAGNOSIS — M65332 Trigger finger, left middle finger: Secondary | ICD-10-CM | POA: Diagnosis present

## 2024-06-15 MED ORDER — METHOTREXATE SODIUM 2.5 MG PO TABS: 12.5000 mg | ORAL_TABLET | ORAL | 0 refills | Status: AC

## 2024-06-16 LAB — COMPREHENSIVE METABOLIC PANEL WITH GFR
AG Ratio: 2 (calc) (ref 1.0–2.5)
ALT: 16 U/L (ref 9–46)
AST: 16 U/L (ref 10–35)
Albumin: 4.3 g/dL (ref 3.6–5.1)
Alkaline phosphatase (APISO): 61 U/L (ref 35–144)
BUN/Creatinine Ratio: 12 (calc) (ref 6–22)
BUN: 16 mg/dL (ref 7–25)
CO2: 32 mmol/L (ref 20–32)
Calcium: 10.2 mg/dL (ref 8.6–10.3)
Chloride: 100 mmol/L (ref 98–110)
Creat: 1.39 mg/dL — ABNORMAL HIGH (ref 0.70–1.28)
Globulin: 2.1 g/dL (ref 1.9–3.7)
Glucose, Bld: 75 mg/dL (ref 65–99)
Potassium: 4.3 mmol/L (ref 3.5–5.3)
Sodium: 139 mmol/L (ref 135–146)
Total Bilirubin: 0.6 mg/dL (ref 0.2–1.2)
Total Protein: 6.4 g/dL (ref 6.1–8.1)
eGFR: 54 mL/min/1.73m2 — ABNORMAL LOW (ref >=60)

## 2024-06-16 LAB — CBC WITH DIFFERENTIAL/PLATELET
Absolute Lymphocytes: 1987 {cells}/uL (ref 850–3900)
Absolute Monocytes: 893 {cells}/uL (ref 200–950)
Basophils Absolute: 58 {cells}/uL (ref 0–200)
Basophils Relative: 0.6 %
Eosinophils Absolute: 77 {cells}/uL (ref 15–500)
Eosinophils Relative: 0.8 %
HCT: 47.7 % (ref 39.4–51.1)
Hemoglobin: 15.6 g/dL (ref 13.2–17.1)
MCH: 31.8 pg (ref 27.0–33.0)
MCHC: 32.7 g/dL (ref 31.6–35.4)
MCV: 97.3 fL (ref 81.4–101.7)
MPV: 9.8 fL (ref 7.5–12.5)
Monocytes Relative: 9.3 %
Neutro Abs: 6586 {cells}/uL (ref 1500–7800)
Neutrophils Relative %: 68.6 %
Platelets: 297 Thousand/uL (ref 140–400)
RBC: 4.9 Million/uL (ref 4.20–5.80)
RDW: 14.5 % (ref 11.0–15.0)
Total Lymphocyte: 20.7 %
WBC: 9.6 Thousand/uL (ref 3.8–10.8)

## 2024-06-16 LAB — SEDIMENTATION RATE: Sed Rate: 2 mm/h (ref 0–20)

## 2024-07-25 ENCOUNTER — Ambulatory Visit: Admitting: Physician Assistant

## 2024-07-25 NOTE — Progress Notes (Unsigned)
 " Established patient visit  Patient: Ralph Doyle   DOB: [birth year]   74 y.o. Male  MRN: [id number] Visit Date: 07/28/2024  Today's healthcare provider: Jolynn Spencer, PA-C   No chief complaint on file.  Subjective       Discussed the use of AI scribe software for clinical note transcription with the patient, who gave verbal consent to proceed.  History of Present Illness        01/14/2024   10:42 AM 09/28/2023    8:36 AM 09/14/2023   10:00 AM  Depression screen PHQ 2/9  Decreased Interest 0 2 1  Down, Depressed, Hopeless 1 2 2   PHQ - 2 Score 1 4 3   Altered sleeping 0 2 3  Tired, decreased energy 1 1 1   Change in appetite 0 1 1  Feeling bad or failure about yourself  1 2 1   Trouble concentrating 0 1 0  Moving slowly or fidgety/restless 0 0 0  Suicidal thoughts 0 0 0  PHQ-9 Score 3  11  9    Difficult doing work/chores  Not difficult at all Not difficult at all     Data saved with a previous flowsheet row definition      01/14/2024   10:42 AM 09/28/2023    8:37 AM 09/14/2023   10:01 AM 08/18/2023    9:06 AM  GAD 7 : Generalized Anxiety Score  Nervous, Anxious, on Edge 1  0  0  0   Control/stop worrying 0  0  1  0   Worry too much - different things 1  1  1  1    Trouble relaxing 1  0  1  1   Restless 0  1  0  1   Easily annoyed or irritable 0  0  0  0   Afraid - awful might happen 1  0  1  1   Total GAD 7 Score 4 2 4 4   Anxiety Difficulty Somewhat difficult Not difficult at all Not difficult at all Somewhat difficult     Data saved with a previous flowsheet row definition    Medications: Show/hide medication list[1]  Review of Systems  All other systems reviewed and are negative.  All negative Except see HPI   {Insert previous labs (optional):23779} {See past labs  Heme  Chem  Endocrine  Serology  Results Review (optional):1}   Objective    There were no vitals taken for this visit. {Insert last BP/Wt (optional):23777}{See vitals history  (optional):1}   Physical Exam Vitals reviewed.  Constitutional:      General: He is not in acute distress.    Appearance: Normal appearance. He is not diaphoretic.  HENT:     Head: Normocephalic and atraumatic.  Eyes:     General: No scleral icterus.    Conjunctiva/sclera: Conjunctivae normal.  Cardiovascular:     Rate and Rhythm: Normal rate and regular rhythm.     Pulses: Normal pulses.     Heart sounds: Normal heart sounds. No murmur heard. Pulmonary:     Effort: Pulmonary effort is normal. No respiratory distress.     Breath sounds: Normal breath sounds. No wheezing or rhonchi.  Musculoskeletal:     Cervical back: Neck supple.     Right lower leg: No edema.     Left lower leg: No edema.  Lymphadenopathy:     Cervical: No cervical adenopathy.  Skin:    General: Skin is warm and dry.     Findings: No rash.  Neurological:     Mental Status: He is alert and oriented to person, place, and time. Mental status is at baseline.  Psychiatric:        Mood and Affect: Mood normal.        Behavior: Behavior normal.     No results found for any visits on 07/28/24.      Assessment and Plan Assessment & Plan     No orders of the defined types were placed in this encounter.   No follow-ups on file.   The patient was advised to call back or seek an in-person evaluation if the symptoms worsen or if the condition fails to improve as anticipated.  I discussed the assessment and treatment plan with the patient. The patient was provided an opportunity to ask questions and all were answered. The patient agreed with the plan and demonstrated an understanding of the instructions.  I, Larcenia Holaday, PA-C have reviewed all documentation for this visit. The documentation on 07/28/2024  for the exam, diagnosis, procedures, and orders are all accurate and complete.  Jolynn Spencer, [facility], MMS [facility] [phone] (phone) [phone] (fax)  [facility]     [1] Outpatient Medications Prior to Visit  Medication Sig   amLODipine  (NORVASC ) 5 MG tablet TAKE 1 TABLET BY MOUTH DAILY WITH LIPITOR   atorvastatin  (LIPITOR) 40 MG tablet Take 1 tablet (40 mg total) by mouth daily.   folic acid  (FOLVITE ) 1 MG tablet TAKE 1 TABLET BY MOUTH DAILY   hydrochlorothiazide  (MICROZIDE ) 12.5 MG capsule TAKE 1 CAPSULE BY MOUTH DAILY   levothyroxine (SYNTHROID) 100 MCG tablet TAKE 1 TABLET BY MOUTH DAILY   methotrexate  (RHEUMATREX) 2.5 MG tablet Take 5 tablets (12.5 mg total) by mouth once a week. Caution:Chemotherapy. Protect from light.   Turmeric 400 MG CAPS Take as needed per directions (Patient not taking: Reported on 06/15/2024)   valsartan  (DIOVAN ) 40 MG tablet TAKE 1 TABLET BY MOUTH DAILY   No facility-administered medications prior to visit.  "

## 2024-07-28 ENCOUNTER — Encounter: Payer: Self-pay | Admitting: Internal Medicine

## 2024-07-28 ENCOUNTER — Encounter: Payer: Self-pay | Admitting: Physician Assistant

## 2024-07-28 ENCOUNTER — Ambulatory Visit: Admitting: Physician Assistant

## 2024-07-28 VITALS — BP 115/69 | HR 61 | Ht 68.0 in | Wt 199.6 lb

## 2024-07-28 DIAGNOSIS — Z79899 Other long term (current) drug therapy: Secondary | ICD-10-CM | POA: Diagnosis not present

## 2024-07-28 DIAGNOSIS — E890 Postprocedural hypothyroidism: Secondary | ICD-10-CM | POA: Diagnosis not present

## 2024-07-28 DIAGNOSIS — J309 Allergic rhinitis, unspecified: Secondary | ICD-10-CM | POA: Insufficient documentation

## 2024-07-28 DIAGNOSIS — Z23 Encounter for immunization: Secondary | ICD-10-CM

## 2024-07-28 DIAGNOSIS — N1831 Chronic kidney disease, stage 3a: Secondary | ICD-10-CM | POA: Diagnosis not present

## 2024-07-28 DIAGNOSIS — I10 Essential (primary) hypertension: Secondary | ICD-10-CM

## 2024-07-28 DIAGNOSIS — J3089 Other allergic rhinitis: Secondary | ICD-10-CM

## 2024-07-28 DIAGNOSIS — M069 Rheumatoid arthritis, unspecified: Secondary | ICD-10-CM

## 2024-07-28 DIAGNOSIS — E669 Obesity, unspecified: Secondary | ICD-10-CM | POA: Diagnosis not present

## 2024-07-28 DIAGNOSIS — E7849 Other hyperlipidemia: Secondary | ICD-10-CM

## 2024-07-28 DIAGNOSIS — K5909 Other constipation: Secondary | ICD-10-CM

## 2024-07-28 NOTE — Telephone Encounter (Signed)
 Patient is currently taking: methotrexate  5 tabs po once weekly and folic acid  1mg  po every day. Please advise.

## 2024-07-29 ENCOUNTER — Ambulatory Visit: Payer: Self-pay | Admitting: Internal Medicine

## 2024-09-14 ENCOUNTER — Ambulatory Visit

## 2024-09-19 ENCOUNTER — Ambulatory Visit: Admitting: Internal Medicine
